# Patient Record
Sex: Male | Born: 1945 | Race: White | Hispanic: No | Marital: Married | Smoking: Former smoker
Health system: Southern US, Community
[De-identification: ages and names within clinical notes are randomized; demographics above are authoritative.]

## PROBLEM LIST (undated history)

## (undated) DIAGNOSIS — I5022 Chronic systolic (congestive) heart failure: Secondary | ICD-10-CM

## (undated) DIAGNOSIS — N400 Enlarged prostate without lower urinary tract symptoms: Secondary | ICD-10-CM

## (undated) DIAGNOSIS — E039 Hypothyroidism, unspecified: Secondary | ICD-10-CM

## (undated) DIAGNOSIS — E079 Disorder of thyroid, unspecified: Secondary | ICD-10-CM

## (undated) HISTORY — DX: Chronic systolic (congestive) heart failure: I50.22

---

## 2015-04-01 ENCOUNTER — Other Ambulatory Visit (HOSPITAL_COMMUNITY): Payer: Self-pay | Admitting: Urology

## 2015-04-01 DIAGNOSIS — R972 Elevated prostate specific antigen [PSA]: Secondary | ICD-10-CM

## 2015-04-20 ENCOUNTER — Ambulatory Visit (HOSPITAL_COMMUNITY)
Admission: RE | Admit: 2015-04-20 | Discharge: 2015-04-20 | Disposition: A | Discharge status: Home / Self Care | Payer: Medicare Other | Source: Ambulatory Visit | Attending: Urology | Admitting: Urology

## 2015-04-20 DIAGNOSIS — N400 Enlarged prostate without lower urinary tract symptoms: Secondary | ICD-10-CM | POA: Diagnosis not present

## 2015-04-20 DIAGNOSIS — R972 Elevated prostate specific antigen [PSA]: Secondary | ICD-10-CM | POA: Insufficient documentation

## 2015-04-20 MED ORDER — GADOBENATE DIMEGLUMINE 529 MG/ML IV SOLN
20.0000 mL | Freq: Once | INTRAVENOUS | Status: AC | PRN
  Administered 2015-04-20: 20 mL via INTRAVENOUS

## 2015-06-05 DIAGNOSIS — E038 Other specified hypothyroidism: Secondary | ICD-10-CM | POA: Diagnosis not present

## 2015-07-06 DIAGNOSIS — R351 Nocturia: Secondary | ICD-10-CM | POA: Diagnosis not present

## 2015-07-06 DIAGNOSIS — R972 Elevated prostate specific antigen [PSA]: Secondary | ICD-10-CM | POA: Diagnosis not present

## 2016-01-11 DIAGNOSIS — R351 Nocturia: Secondary | ICD-10-CM | POA: Diagnosis not present

## 2016-01-11 DIAGNOSIS — R972 Elevated prostate specific antigen [PSA]: Secondary | ICD-10-CM | POA: Diagnosis not present

## 2016-01-11 DIAGNOSIS — N401 Enlarged prostate with lower urinary tract symptoms: Secondary | ICD-10-CM | POA: Diagnosis not present

## 2016-01-11 DIAGNOSIS — R3914 Feeling of incomplete bladder emptying: Secondary | ICD-10-CM | POA: Diagnosis not present

## 2016-03-28 DIAGNOSIS — Z125 Encounter for screening for malignant neoplasm of prostate: Secondary | ICD-10-CM | POA: Diagnosis not present

## 2016-03-28 DIAGNOSIS — E038 Other specified hypothyroidism: Secondary | ICD-10-CM | POA: Diagnosis not present

## 2016-03-28 DIAGNOSIS — E781 Pure hyperglyceridemia: Secondary | ICD-10-CM | POA: Diagnosis not present

## 2016-04-04 DIAGNOSIS — E781 Pure hyperglyceridemia: Secondary | ICD-10-CM | POA: Diagnosis not present

## 2016-04-04 DIAGNOSIS — Z6832 Body mass index (BMI) 32.0-32.9, adult: Secondary | ICD-10-CM | POA: Diagnosis not present

## 2016-04-04 DIAGNOSIS — E038 Other specified hypothyroidism: Secondary | ICD-10-CM | POA: Diagnosis not present

## 2016-04-04 DIAGNOSIS — Z1389 Encounter for screening for other disorder: Secondary | ICD-10-CM | POA: Diagnosis not present

## 2016-04-04 DIAGNOSIS — N400 Enlarged prostate without lower urinary tract symptoms: Secondary | ICD-10-CM | POA: Diagnosis not present

## 2016-04-04 DIAGNOSIS — Z0000 Encounter for general adult medical examination without abnormal findings: Secondary | ICD-10-CM | POA: Diagnosis not present

## 2016-04-04 DIAGNOSIS — E668 Other obesity: Secondary | ICD-10-CM | POA: Diagnosis not present

## 2016-04-04 DIAGNOSIS — D698 Other specified hemorrhagic conditions: Secondary | ICD-10-CM | POA: Diagnosis not present

## 2016-06-16 DIAGNOSIS — L820 Inflamed seborrheic keratosis: Secondary | ICD-10-CM | POA: Diagnosis not present

## 2017-01-25 DIAGNOSIS — N401 Enlarged prostate with lower urinary tract symptoms: Secondary | ICD-10-CM | POA: Diagnosis not present

## 2017-01-25 DIAGNOSIS — R972 Elevated prostate specific antigen [PSA]: Secondary | ICD-10-CM | POA: Diagnosis not present

## 2017-01-25 DIAGNOSIS — R351 Nocturia: Secondary | ICD-10-CM | POA: Diagnosis not present

## 2017-04-03 DIAGNOSIS — Z125 Encounter for screening for malignant neoplasm of prostate: Secondary | ICD-10-CM | POA: Diagnosis not present

## 2017-04-03 DIAGNOSIS — E781 Pure hyperglyceridemia: Secondary | ICD-10-CM | POA: Diagnosis not present

## 2017-04-03 DIAGNOSIS — E038 Other specified hypothyroidism: Secondary | ICD-10-CM | POA: Diagnosis not present

## 2017-04-12 DIAGNOSIS — E781 Pure hyperglyceridemia: Secondary | ICD-10-CM | POA: Diagnosis not present

## 2017-04-12 DIAGNOSIS — E038 Other specified hypothyroidism: Secondary | ICD-10-CM | POA: Diagnosis not present

## 2017-04-12 DIAGNOSIS — E668 Other obesity: Secondary | ICD-10-CM | POA: Diagnosis not present

## 2017-04-12 DIAGNOSIS — Z0000 Encounter for general adult medical examination without abnormal findings: Secondary | ICD-10-CM | POA: Diagnosis not present

## 2017-05-16 DIAGNOSIS — L739 Follicular disorder, unspecified: Secondary | ICD-10-CM | POA: Diagnosis not present

## 2017-05-16 DIAGNOSIS — D0439 Carcinoma in situ of skin of other parts of face: Secondary | ICD-10-CM | POA: Diagnosis not present

## 2017-05-16 DIAGNOSIS — Z23 Encounter for immunization: Secondary | ICD-10-CM | POA: Diagnosis not present

## 2017-05-16 DIAGNOSIS — L820 Inflamed seborrheic keratosis: Secondary | ICD-10-CM | POA: Diagnosis not present

## 2017-05-16 DIAGNOSIS — D485 Neoplasm of uncertain behavior of skin: Secondary | ICD-10-CM | POA: Diagnosis not present

## 2017-07-21 DIAGNOSIS — H02135 Senile ectropion of left lower eyelid: Secondary | ICD-10-CM | POA: Diagnosis not present

## 2017-07-21 DIAGNOSIS — H02132 Senile ectropion of right lower eyelid: Secondary | ICD-10-CM | POA: Diagnosis not present

## 2017-08-07 DIAGNOSIS — H02132 Senile ectropion of right lower eyelid: Secondary | ICD-10-CM | POA: Diagnosis not present

## 2017-08-07 DIAGNOSIS — H02105 Unspecified ectropion of left lower eyelid: Secondary | ICD-10-CM | POA: Diagnosis not present

## 2017-08-07 DIAGNOSIS — H02102 Unspecified ectropion of right lower eyelid: Secondary | ICD-10-CM | POA: Diagnosis not present

## 2017-08-07 DIAGNOSIS — H02135 Senile ectropion of left lower eyelid: Secondary | ICD-10-CM | POA: Diagnosis not present

## 2017-11-13 DIAGNOSIS — L309 Dermatitis, unspecified: Secondary | ICD-10-CM | POA: Diagnosis not present

## 2017-11-13 DIAGNOSIS — D225 Melanocytic nevi of trunk: Secondary | ICD-10-CM | POA: Diagnosis not present

## 2017-11-13 DIAGNOSIS — Z85828 Personal history of other malignant neoplasm of skin: Secondary | ICD-10-CM | POA: Diagnosis not present

## 2017-11-13 DIAGNOSIS — L821 Other seborrheic keratosis: Secondary | ICD-10-CM | POA: Diagnosis not present

## 2017-11-28 DIAGNOSIS — R030 Elevated blood-pressure reading, without diagnosis of hypertension: Secondary | ICD-10-CM | POA: Diagnosis not present

## 2018-02-23 DIAGNOSIS — Z23 Encounter for immunization: Secondary | ICD-10-CM | POA: Diagnosis not present

## 2018-04-11 DIAGNOSIS — Z125 Encounter for screening for malignant neoplasm of prostate: Secondary | ICD-10-CM | POA: Diagnosis not present

## 2018-04-11 DIAGNOSIS — R82998 Other abnormal findings in urine: Secondary | ICD-10-CM | POA: Diagnosis not present

## 2018-04-11 DIAGNOSIS — E038 Other specified hypothyroidism: Secondary | ICD-10-CM | POA: Diagnosis not present

## 2018-04-11 DIAGNOSIS — E781 Pure hyperglyceridemia: Secondary | ICD-10-CM | POA: Diagnosis not present

## 2018-04-12 DIAGNOSIS — E038 Other specified hypothyroidism: Secondary | ICD-10-CM | POA: Diagnosis not present

## 2018-04-16 DIAGNOSIS — Z0000 Encounter for general adult medical examination without abnormal findings: Secondary | ICD-10-CM | POA: Diagnosis not present

## 2018-04-16 DIAGNOSIS — E038 Other specified hypothyroidism: Secondary | ICD-10-CM | POA: Diagnosis not present

## 2018-04-16 DIAGNOSIS — N400 Enlarged prostate without lower urinary tract symptoms: Secondary | ICD-10-CM | POA: Diagnosis not present

## 2018-04-16 DIAGNOSIS — E781 Pure hyperglyceridemia: Secondary | ICD-10-CM | POA: Diagnosis not present

## 2018-06-11 DIAGNOSIS — M25559 Pain in unspecified hip: Secondary | ICD-10-CM | POA: Diagnosis not present

## 2018-06-11 DIAGNOSIS — M25561 Pain in right knee: Secondary | ICD-10-CM | POA: Diagnosis not present

## 2018-06-11 DIAGNOSIS — M25552 Pain in left hip: Secondary | ICD-10-CM | POA: Diagnosis not present

## 2019-01-19 DIAGNOSIS — Z23 Encounter for immunization: Secondary | ICD-10-CM | POA: Diagnosis not present

## 2019-04-03 ENCOUNTER — Ambulatory Visit (INDEPENDENT_AMBULATORY_CARE_PROVIDER_SITE_OTHER): Payer: Medicare Other | Admitting: Podiatry

## 2019-04-03 ENCOUNTER — Encounter: Payer: Self-pay | Admitting: Podiatry

## 2019-04-03 ENCOUNTER — Other Ambulatory Visit: Payer: Self-pay

## 2019-04-03 ENCOUNTER — Ambulatory Visit (INDEPENDENT_AMBULATORY_CARE_PROVIDER_SITE_OTHER): Payer: Medicare Other

## 2019-04-03 VITALS — BP 145/81 | HR 85

## 2019-04-03 DIAGNOSIS — D3610 Benign neoplasm of peripheral nerves and autonomic nervous system, unspecified: Secondary | ICD-10-CM

## 2019-04-03 DIAGNOSIS — M79671 Pain in right foot: Secondary | ICD-10-CM

## 2019-04-03 DIAGNOSIS — L600 Ingrowing nail: Secondary | ICD-10-CM

## 2019-04-03 MED ORDER — GENTAMICIN SULFATE 0.1 % EX CREA: 1.0000 "application " | TOPICAL_CREAM | Freq: Two times a day (BID) | CUTANEOUS | 1 refills | Status: DC

## 2019-04-03 NOTE — Patient Instructions (Signed)

## 2019-04-07 NOTE — Progress Notes (Signed)
   Subjective: Patient presents today for evaluation of pain to the lateral border of the right hallux that began 1-2 weeks ago. He reports associated bleeding from the area. Patient is concerned for possible ingrown nail. He has been applying Neosporin to the toe for treatment. Wearing shoes and touching the area increases the pain. Patient presents today for further treatment and evaluation.  History reviewed. No pertinent past medical history.  Objective:  General: Well developed, nourished, in no acute distress, alert and oriented x3   Dermatology: Skin is warm, dry and supple bilateral. Lateral border right hallux appears to be erythematous with evidence of an ingrowing nail. Pain on palpation noted to the border of the nail fold. The remaining nails appear unremarkable at this time. There are no open sores, lesions.  Vascular: Dorsalis Pedis artery and Posterior Tibial artery pedal pulses palpable. No lower extremity edema noted.   Neruologic: Grossly intact via light touch bilateral.  Musculoskeletal: Muscular strength within normal limits in all groups bilateral. Normal range of motion noted to all pedal and ankle joints.   Assesement: #1 Paronychia with ingrowing nail lateral border right hallux  #2 Pain in toe #3 Incurvated nail  Plan of Care:  1. Patient evaluated.  2. Discussed treatment alternatives and plan of care. Explained nail avulsion procedure and post procedure course to patient. 3. Patient opted for permanent partial nail avulsion of the lateral border right hallux.  4. Prior to procedure, local anesthesia infiltration utilized using 3 ml of a 50:50 mixture of 2% plain lidocaine and 0.5% plain marcaine in a normal hallux block fashion and a betadine prep performed.  5. Partial permanent nail avulsion with chemical matrixectomy performed using [id number] applications of phenol followed by alcohol flush.  6. Light dressing applied. 7. Prescription for Gentamicin cream  provided to patient to use daily with a bandage.  8. Return to clinic in 3 weeks.  Retired. Used to do Location manager.    Edrick Kins, DPM [facility]  Dr. Edrick Kins, [address]                                        [place], [place] [address]                Office [phone]  Fax [phone]

## 2019-04-12 DIAGNOSIS — E038 Other specified hypothyroidism: Secondary | ICD-10-CM | POA: Diagnosis not present

## 2019-04-12 DIAGNOSIS — R82998 Other abnormal findings in urine: Secondary | ICD-10-CM | POA: Diagnosis not present

## 2019-04-12 DIAGNOSIS — E781 Pure hyperglyceridemia: Secondary | ICD-10-CM | POA: Diagnosis not present

## 2019-04-12 DIAGNOSIS — E559 Vitamin D deficiency, unspecified: Secondary | ICD-10-CM | POA: Diagnosis not present

## 2019-04-12 DIAGNOSIS — Z125 Encounter for screening for malignant neoplasm of prostate: Secondary | ICD-10-CM | POA: Diagnosis not present

## 2019-04-19 DIAGNOSIS — Z0000 Encounter for general adult medical examination without abnormal findings: Secondary | ICD-10-CM | POA: Diagnosis not present

## 2019-04-19 DIAGNOSIS — E559 Vitamin D deficiency, unspecified: Secondary | ICD-10-CM | POA: Diagnosis not present

## 2019-04-19 DIAGNOSIS — R030 Elevated blood-pressure reading, without diagnosis of hypertension: Secondary | ICD-10-CM | POA: Diagnosis not present

## 2019-04-19 DIAGNOSIS — E039 Hypothyroidism, unspecified: Secondary | ICD-10-CM | POA: Diagnosis not present

## 2019-04-24 ENCOUNTER — Ambulatory Visit (INDEPENDENT_AMBULATORY_CARE_PROVIDER_SITE_OTHER): Payer: Medicare Other | Admitting: Podiatry

## 2019-04-24 ENCOUNTER — Encounter: Payer: Self-pay | Admitting: Podiatry

## 2019-04-24 ENCOUNTER — Other Ambulatory Visit: Payer: Self-pay

## 2019-04-24 DIAGNOSIS — L600 Ingrowing nail: Secondary | ICD-10-CM

## 2019-05-01 NOTE — Progress Notes (Signed)
   Subjective: 73 y.o. male presents today status post permanent nail avulsion procedure of the lateral border of the right hallux that was performed on 04/03/2019. He states he is doing well. He denies any pain or drainage at this time. He has been using the Gentamicin cream as directed. Patient is here for further evaluation and treatment.    History reviewed. No pertinent past medical history.  Objective: Skin is warm, dry and supple. Nail and respective nail fold appears to be healing appropriately. Open wound to the associated nail fold with a granular wound base and moderate amount of fibrotic tissue. Minimal drainage noted. Mild erythema around the periungual region likely due to phenol chemical matricectomy.  Assessment: #1 postop permanent partial nail avulsion lateral border right great toenail  #2 open wound periungual nail fold of respective digit.   Plan of care: #1 patient was evaluated  #2 debridement of open wound was performed to the periungual border of the respective toe using a currette. Antibiotic ointment and Band-Aid was applied. #3 patient is to return to clinic on a PRN basis.   Edrick Kins, DPM [facility]  Dr. Edrick Kins, [address]                                        [place], [place] [address]                Office [phone]  Fax [phone]

## 2019-10-26 DIAGNOSIS — L259 Unspecified contact dermatitis, unspecified cause: Secondary | ICD-10-CM | POA: Diagnosis not present

## 2020-02-12 DIAGNOSIS — Z23 Encounter for immunization: Secondary | ICD-10-CM | POA: Diagnosis not present

## 2020-04-13 DIAGNOSIS — H9211 Otorrhea, right ear: Secondary | ICD-10-CM | POA: Diagnosis not present

## 2020-04-13 DIAGNOSIS — H9191 Unspecified hearing loss, right ear: Secondary | ICD-10-CM | POA: Diagnosis not present

## 2020-04-13 DIAGNOSIS — H6691 Otitis media, unspecified, right ear: Secondary | ICD-10-CM | POA: Diagnosis not present

## 2020-05-05 DIAGNOSIS — Z125 Encounter for screening for malignant neoplasm of prostate: Secondary | ICD-10-CM | POA: Diagnosis not present

## 2020-05-05 DIAGNOSIS — E781 Pure hyperglyceridemia: Secondary | ICD-10-CM | POA: Diagnosis not present

## 2020-05-05 DIAGNOSIS — E559 Vitamin D deficiency, unspecified: Secondary | ICD-10-CM | POA: Diagnosis not present

## 2020-05-05 DIAGNOSIS — E039 Hypothyroidism, unspecified: Secondary | ICD-10-CM | POA: Diagnosis not present

## 2020-05-08 DIAGNOSIS — M4727 Other spondylosis with radiculopathy, lumbosacral region: Secondary | ICD-10-CM | POA: Diagnosis not present

## 2020-05-08 DIAGNOSIS — M5116 Intervertebral disc disorders with radiculopathy, lumbar region: Secondary | ICD-10-CM | POA: Diagnosis not present

## 2020-05-08 DIAGNOSIS — M48061 Spinal stenosis, lumbar region without neurogenic claudication: Secondary | ICD-10-CM | POA: Diagnosis not present

## 2020-05-08 DIAGNOSIS — M4726 Other spondylosis with radiculopathy, lumbar region: Secondary | ICD-10-CM | POA: Diagnosis not present

## 2020-05-12 DIAGNOSIS — R82998 Other abnormal findings in urine: Secondary | ICD-10-CM | POA: Diagnosis not present

## 2020-06-15 DIAGNOSIS — R6889 Other general symptoms and signs: Secondary | ICD-10-CM | POA: Diagnosis not present

## 2020-06-15 DIAGNOSIS — M5136 Other intervertebral disc degeneration, lumbar region: Secondary | ICD-10-CM | POA: Diagnosis not present

## 2020-06-15 DIAGNOSIS — M5416 Radiculopathy, lumbar region: Secondary | ICD-10-CM | POA: Diagnosis not present

## 2020-06-15 DIAGNOSIS — M4727 Other spondylosis with radiculopathy, lumbosacral region: Secondary | ICD-10-CM | POA: Diagnosis not present

## 2020-06-24 DIAGNOSIS — M5416 Radiculopathy, lumbar region: Secondary | ICD-10-CM | POA: Diagnosis not present

## 2020-06-24 DIAGNOSIS — M48061 Spinal stenosis, lumbar region without neurogenic claudication: Secondary | ICD-10-CM | POA: Diagnosis not present

## 2020-06-24 DIAGNOSIS — M5136 Other intervertebral disc degeneration, lumbar region: Secondary | ICD-10-CM | POA: Diagnosis not present

## 2020-06-24 DIAGNOSIS — M4727 Other spondylosis with radiculopathy, lumbosacral region: Secondary | ICD-10-CM | POA: Diagnosis not present

## 2020-07-02 DIAGNOSIS — R6889 Other general symptoms and signs: Secondary | ICD-10-CM | POA: Diagnosis not present

## 2020-07-02 DIAGNOSIS — M5416 Radiculopathy, lumbar region: Secondary | ICD-10-CM | POA: Diagnosis not present

## 2020-07-23 DIAGNOSIS — R6889 Other general symptoms and signs: Secondary | ICD-10-CM | POA: Diagnosis not present

## 2020-07-23 DIAGNOSIS — M5416 Radiculopathy, lumbar region: Secondary | ICD-10-CM | POA: Diagnosis not present

## 2021-03-01 DIAGNOSIS — H9191 Unspecified hearing loss, right ear: Secondary | ICD-10-CM | POA: Diagnosis not present

## 2021-03-01 DIAGNOSIS — R030 Elevated blood-pressure reading, without diagnosis of hypertension: Secondary | ICD-10-CM | POA: Diagnosis not present

## 2021-03-01 DIAGNOSIS — E039 Hypothyroidism, unspecified: Secondary | ICD-10-CM | POA: Diagnosis not present

## 2021-03-01 DIAGNOSIS — R519 Headache, unspecified: Secondary | ICD-10-CM | POA: Diagnosis not present

## 2021-03-01 DIAGNOSIS — R61 Generalized hyperhidrosis: Secondary | ICD-10-CM | POA: Diagnosis not present

## 2021-03-02 ENCOUNTER — Other Ambulatory Visit: Payer: Self-pay | Admitting: Internal Medicine

## 2021-03-02 ENCOUNTER — Other Ambulatory Visit (HOSPITAL_COMMUNITY): Payer: Self-pay | Admitting: Internal Medicine

## 2021-03-02 DIAGNOSIS — R519 Headache, unspecified: Secondary | ICD-10-CM

## 2021-03-04 ENCOUNTER — Ambulatory Visit (HOSPITAL_COMMUNITY)
Admission: RE | Admit: 2021-03-04 | Discharge: 2021-03-04 | Disposition: A | Discharge status: Home / Self Care | Payer: Medicare Other | Source: Ambulatory Visit | Attending: Internal Medicine | Admitting: Internal Medicine

## 2021-03-04 DIAGNOSIS — R519 Headache, unspecified: Secondary | ICD-10-CM | POA: Insufficient documentation

## 2021-03-04 DIAGNOSIS — R413 Other amnesia: Secondary | ICD-10-CM | POA: Diagnosis not present

## 2021-03-04 DIAGNOSIS — G319 Degenerative disease of nervous system, unspecified: Secondary | ICD-10-CM | POA: Diagnosis not present

## 2021-03-04 MED ORDER — GADOBUTROL 1 MMOL/ML IV SOLN
10.0000 mL | Freq: Once | INTRAVENOUS | Status: AC | PRN
  Administered 2021-03-04: 10 mL via INTRAVENOUS

## 2021-05-24 DIAGNOSIS — R61 Generalized hyperhidrosis: Secondary | ICD-10-CM | POA: Diagnosis not present

## 2021-05-24 DIAGNOSIS — E039 Hypothyroidism, unspecified: Secondary | ICD-10-CM | POA: Diagnosis not present

## 2021-05-24 DIAGNOSIS — E781 Pure hyperglyceridemia: Secondary | ICD-10-CM | POA: Diagnosis not present

## 2021-05-24 DIAGNOSIS — E559 Vitamin D deficiency, unspecified: Secondary | ICD-10-CM | POA: Diagnosis not present

## 2021-05-24 DIAGNOSIS — Z125 Encounter for screening for malignant neoplasm of prostate: Secondary | ICD-10-CM | POA: Diagnosis not present

## 2021-05-24 DIAGNOSIS — R972 Elevated prostate specific antigen [PSA]: Secondary | ICD-10-CM | POA: Diagnosis not present

## 2021-05-31 DIAGNOSIS — R82998 Other abnormal findings in urine: Secondary | ICD-10-CM | POA: Diagnosis not present

## 2021-07-19 DIAGNOSIS — E039 Hypothyroidism, unspecified: Secondary | ICD-10-CM | POA: Diagnosis not present

## 2021-12-27 DIAGNOSIS — E039 Hypothyroidism, unspecified: Secondary | ICD-10-CM | POA: Diagnosis not present

## 2021-12-27 DIAGNOSIS — R634 Abnormal weight loss: Secondary | ICD-10-CM | POA: Diagnosis not present

## 2021-12-27 DIAGNOSIS — E162 Hypoglycemia, unspecified: Secondary | ICD-10-CM | POA: Diagnosis not present

## 2022-01-05 DIAGNOSIS — E039 Hypothyroidism, unspecified: Secondary | ICD-10-CM | POA: Diagnosis not present

## 2022-01-07 DIAGNOSIS — L570 Actinic keratosis: Secondary | ICD-10-CM | POA: Diagnosis not present

## 2022-02-05 DIAGNOSIS — Z23 Encounter for immunization: Secondary | ICD-10-CM | POA: Diagnosis not present

## 2022-02-11 DIAGNOSIS — H2512 Age-related nuclear cataract, left eye: Secondary | ICD-10-CM | POA: Diagnosis not present

## 2022-02-11 DIAGNOSIS — H35363 Drusen (degenerative) of macula, bilateral: Secondary | ICD-10-CM | POA: Diagnosis not present

## 2022-02-11 DIAGNOSIS — H2511 Age-related nuclear cataract, right eye: Secondary | ICD-10-CM | POA: Diagnosis not present

## 2022-02-14 DIAGNOSIS — E039 Hypothyroidism, unspecified: Secondary | ICD-10-CM | POA: Diagnosis not present

## 2022-03-11 DIAGNOSIS — L570 Actinic keratosis: Secondary | ICD-10-CM | POA: Diagnosis not present

## 2022-04-18 DIAGNOSIS — R972 Elevated prostate specific antigen [PSA]: Secondary | ICD-10-CM | POA: Diagnosis not present

## 2022-05-12 DIAGNOSIS — E039 Hypothyroidism, unspecified: Secondary | ICD-10-CM | POA: Diagnosis not present

## 2022-05-24 ENCOUNTER — Other Ambulatory Visit: Payer: Self-pay | Admitting: Urology

## 2022-05-24 DIAGNOSIS — R972 Elevated prostate specific antigen [PSA]: Secondary | ICD-10-CM

## 2022-06-13 DIAGNOSIS — R3589 Other polyuria: Secondary | ICD-10-CM | POA: Diagnosis not present

## 2022-06-15 DIAGNOSIS — E781 Pure hyperglyceridemia: Secondary | ICD-10-CM | POA: Diagnosis not present

## 2022-06-15 DIAGNOSIS — E559 Vitamin D deficiency, unspecified: Secondary | ICD-10-CM | POA: Diagnosis not present

## 2022-06-15 DIAGNOSIS — E039 Hypothyroidism, unspecified: Secondary | ICD-10-CM | POA: Diagnosis not present

## 2022-06-15 DIAGNOSIS — R030 Elevated blood-pressure reading, without diagnosis of hypertension: Secondary | ICD-10-CM | POA: Diagnosis not present

## 2022-06-15 DIAGNOSIS — E162 Hypoglycemia, unspecified: Secondary | ICD-10-CM | POA: Diagnosis not present

## 2022-06-15 DIAGNOSIS — Z125 Encounter for screening for malignant neoplasm of prostate: Secondary | ICD-10-CM | POA: Diagnosis not present

## 2022-06-20 DIAGNOSIS — Z1339 Encounter for screening examination for other mental health and behavioral disorders: Secondary | ICD-10-CM | POA: Diagnosis not present

## 2022-06-20 DIAGNOSIS — Z23 Encounter for immunization: Secondary | ICD-10-CM | POA: Diagnosis not present

## 2022-06-20 DIAGNOSIS — Z0000 Encounter for general adult medical examination without abnormal findings: Secondary | ICD-10-CM | POA: Diagnosis not present

## 2022-06-20 DIAGNOSIS — N400 Enlarged prostate without lower urinary tract symptoms: Secondary | ICD-10-CM | POA: Diagnosis not present

## 2022-06-20 DIAGNOSIS — Z1331 Encounter for screening for depression: Secondary | ICD-10-CM | POA: Diagnosis not present

## 2022-06-20 DIAGNOSIS — E039 Hypothyroidism, unspecified: Secondary | ICD-10-CM | POA: Diagnosis not present

## 2022-06-20 DIAGNOSIS — R972 Elevated prostate specific antigen [PSA]: Secondary | ICD-10-CM | POA: Diagnosis not present

## 2022-06-21 DIAGNOSIS — R338 Other retention of urine: Secondary | ICD-10-CM | POA: Diagnosis not present

## 2022-06-24 ENCOUNTER — Ambulatory Visit
Admission: RE | Admit: 2022-06-24 | Discharge: 2022-06-24 | Disposition: A | Discharge status: Home / Self Care | Payer: Medicare Other | Source: Ambulatory Visit | Attending: Urology | Admitting: Urology

## 2022-06-24 DIAGNOSIS — R972 Elevated prostate specific antigen [PSA]: Secondary | ICD-10-CM | POA: Diagnosis not present

## 2022-06-24 MED ORDER — GADOPICLENOL 0.5 MMOL/ML IV SOLN
10.0000 mL | Freq: Once | INTRAVENOUS | Status: AC | PRN
  Administered 2022-06-24: 10 mL via INTRAVENOUS

## 2022-06-28 DIAGNOSIS — R338 Other retention of urine: Secondary | ICD-10-CM | POA: Diagnosis not present

## 2022-06-28 DIAGNOSIS — N401 Enlarged prostate with lower urinary tract symptoms: Secondary | ICD-10-CM | POA: Diagnosis not present

## 2022-06-28 DIAGNOSIS — R972 Elevated prostate specific antigen [PSA]: Secondary | ICD-10-CM | POA: Diagnosis not present

## 2022-06-30 DIAGNOSIS — N401 Enlarged prostate with lower urinary tract symptoms: Secondary | ICD-10-CM | POA: Diagnosis not present

## 2022-06-30 DIAGNOSIS — R338 Other retention of urine: Secondary | ICD-10-CM | POA: Diagnosis not present

## 2022-07-01 DIAGNOSIS — Z1212 Encounter for screening for malignant neoplasm of rectum: Secondary | ICD-10-CM | POA: Diagnosis not present

## 2022-07-02 ENCOUNTER — Inpatient Hospital Stay (HOSPITAL_BASED_OUTPATIENT_CLINIC_OR_DEPARTMENT_OTHER)
Admission: EM | Admit: 2022-07-02 | Discharge: 2022-07-10 | DRG: 666 | Disposition: A | Discharge status: Home Health | Payer: Medicare Other | Attending: Family Medicine | Admitting: Family Medicine

## 2022-07-02 ENCOUNTER — Other Ambulatory Visit: Payer: Self-pay

## 2022-07-02 ENCOUNTER — Encounter (HOSPITAL_BASED_OUTPATIENT_CLINIC_OR_DEPARTMENT_OTHER): Payer: Self-pay | Admitting: Emergency Medicine

## 2022-07-02 DIAGNOSIS — Z87891 Personal history of nicotine dependence: Secondary | ICD-10-CM | POA: Diagnosis not present

## 2022-07-02 DIAGNOSIS — R591 Generalized enlarged lymph nodes: Secondary | ICD-10-CM | POA: Diagnosis not present

## 2022-07-02 DIAGNOSIS — I420 Dilated cardiomyopathy: Secondary | ICD-10-CM | POA: Diagnosis not present

## 2022-07-02 DIAGNOSIS — I5022 Chronic systolic (congestive) heart failure: Secondary | ICD-10-CM | POA: Insufficient documentation

## 2022-07-02 DIAGNOSIS — I5021 Acute systolic (congestive) heart failure: Secondary | ICD-10-CM | POA: Diagnosis not present

## 2022-07-02 DIAGNOSIS — B9561 Methicillin susceptible Staphylococcus aureus infection as the cause of diseases classified elsewhere: Secondary | ICD-10-CM | POA: Diagnosis present

## 2022-07-02 DIAGNOSIS — Z792 Long term (current) use of antibiotics: Secondary | ICD-10-CM | POA: Diagnosis not present

## 2022-07-02 DIAGNOSIS — R972 Elevated prostate specific antigen [PSA]: Secondary | ICD-10-CM | POA: Diagnosis present

## 2022-07-02 DIAGNOSIS — N3001 Acute cystitis with hematuria: Secondary | ICD-10-CM | POA: Diagnosis not present

## 2022-07-02 DIAGNOSIS — N390 Urinary tract infection, site not specified: Secondary | ICD-10-CM | POA: Diagnosis not present

## 2022-07-02 DIAGNOSIS — R931 Abnormal findings on diagnostic imaging of heart and coronary circulation: Secondary | ICD-10-CM | POA: Diagnosis not present

## 2022-07-02 DIAGNOSIS — R7881 Bacteremia: Secondary | ICD-10-CM | POA: Diagnosis present

## 2022-07-02 DIAGNOSIS — R0989 Other specified symptoms and signs involving the circulatory and respiratory systems: Secondary | ICD-10-CM | POA: Diagnosis not present

## 2022-07-02 DIAGNOSIS — Y846 Urinary catheterization as the cause of abnormal reaction of the patient, or of later complication, without mention of misadventure at the time of the procedure: Secondary | ICD-10-CM | POA: Diagnosis not present

## 2022-07-02 DIAGNOSIS — N179 Acute kidney failure, unspecified: Secondary | ICD-10-CM

## 2022-07-02 DIAGNOSIS — I9581 Postprocedural hypotension: Secondary | ICD-10-CM | POA: Diagnosis not present

## 2022-07-02 DIAGNOSIS — Z452 Encounter for adjustment and management of vascular access device: Secondary | ICD-10-CM | POA: Diagnosis not present

## 2022-07-02 DIAGNOSIS — I10 Essential (primary) hypertension: Secondary | ICD-10-CM | POA: Diagnosis present

## 2022-07-02 DIAGNOSIS — N1330 Unspecified hydronephrosis: Secondary | ICD-10-CM | POA: Diagnosis not present

## 2022-07-02 DIAGNOSIS — I110 Hypertensive heart disease with heart failure: Secondary | ICD-10-CM | POA: Diagnosis not present

## 2022-07-02 DIAGNOSIS — I5023 Acute on chronic systolic (congestive) heart failure: Secondary | ICD-10-CM | POA: Diagnosis not present

## 2022-07-02 DIAGNOSIS — N401 Enlarged prostate with lower urinary tract symptoms: Secondary | ICD-10-CM | POA: Diagnosis present

## 2022-07-02 DIAGNOSIS — I083 Combined rheumatic disorders of mitral, aortic and tricuspid valves: Secondary | ICD-10-CM | POA: Diagnosis not present

## 2022-07-02 DIAGNOSIS — D62 Acute posthemorrhagic anemia: Secondary | ICD-10-CM | POA: Diagnosis not present

## 2022-07-02 DIAGNOSIS — I509 Heart failure, unspecified: Secondary | ICD-10-CM | POA: Diagnosis not present

## 2022-07-02 DIAGNOSIS — E039 Hypothyroidism, unspecified: Secondary | ICD-10-CM | POA: Diagnosis not present

## 2022-07-02 DIAGNOSIS — T83091A Other mechanical complication of indwelling urethral catheter, initial encounter: Secondary | ICD-10-CM | POA: Diagnosis present

## 2022-07-02 DIAGNOSIS — R338 Other retention of urine: Secondary | ICD-10-CM | POA: Diagnosis present

## 2022-07-02 DIAGNOSIS — Z79890 Hormone replacement therapy (postmenopausal): Secondary | ICD-10-CM | POA: Diagnosis not present

## 2022-07-02 DIAGNOSIS — K5900 Constipation, unspecified: Secondary | ICD-10-CM | POA: Diagnosis present

## 2022-07-02 DIAGNOSIS — T839XXA Unspecified complication of genitourinary prosthetic device, implant and graft, initial encounter: Secondary | ICD-10-CM

## 2022-07-02 DIAGNOSIS — R319 Hematuria, unspecified: Secondary | ICD-10-CM | POA: Diagnosis not present

## 2022-07-02 DIAGNOSIS — D638 Anemia in other chronic diseases classified elsewhere: Secondary | ICD-10-CM | POA: Diagnosis not present

## 2022-07-02 DIAGNOSIS — N3289 Other specified disorders of bladder: Secondary | ICD-10-CM | POA: Diagnosis not present

## 2022-07-02 DIAGNOSIS — R531 Weakness: Secondary | ICD-10-CM | POA: Diagnosis not present

## 2022-07-02 DIAGNOSIS — R310 Gross hematuria: Secondary | ICD-10-CM | POA: Diagnosis present

## 2022-07-02 DIAGNOSIS — Z79899 Other long term (current) drug therapy: Secondary | ICD-10-CM

## 2022-07-02 DIAGNOSIS — R000 Tachycardia, unspecified: Secondary | ICD-10-CM | POA: Diagnosis not present

## 2022-07-02 DIAGNOSIS — N400 Enlarged prostate without lower urinary tract symptoms: Secondary | ICD-10-CM | POA: Diagnosis not present

## 2022-07-02 DIAGNOSIS — Z466 Encounter for fitting and adjustment of urinary device: Secondary | ICD-10-CM | POA: Diagnosis not present

## 2022-07-02 DIAGNOSIS — N134 Hydroureter: Secondary | ICD-10-CM | POA: Diagnosis not present

## 2022-07-02 DIAGNOSIS — R6510 Systemic inflammatory response syndrome (SIRS) of non-infectious origin without acute organ dysfunction: Secondary | ICD-10-CM

## 2022-07-02 DIAGNOSIS — T83511A Infection and inflammatory reaction due to indwelling urethral catheter, initial encounter: Secondary | ICD-10-CM | POA: Diagnosis not present

## 2022-07-02 DIAGNOSIS — Z556 Problems related to health literacy: Secondary | ICD-10-CM | POA: Diagnosis not present

## 2022-07-02 DIAGNOSIS — R935 Abnormal findings on diagnostic imaging of other abdominal regions, including retroperitoneum: Secondary | ICD-10-CM | POA: Diagnosis not present

## 2022-07-02 DIAGNOSIS — I255 Ischemic cardiomyopathy: Secondary | ICD-10-CM | POA: Diagnosis not present

## 2022-07-02 DIAGNOSIS — B958 Unspecified staphylococcus as the cause of diseases classified elsewhere: Secondary | ICD-10-CM | POA: Diagnosis not present

## 2022-07-02 DIAGNOSIS — I119 Hypertensive heart disease without heart failure: Secondary | ICD-10-CM | POA: Diagnosis not present

## 2022-07-02 DIAGNOSIS — I351 Nonrheumatic aortic (valve) insufficiency: Secondary | ICD-10-CM | POA: Diagnosis not present

## 2022-07-02 DIAGNOSIS — R590 Localized enlarged lymph nodes: Secondary | ICD-10-CM | POA: Insufficient documentation

## 2022-07-02 DIAGNOSIS — D500 Iron deficiency anemia secondary to blood loss (chronic): Secondary | ICD-10-CM

## 2022-07-02 HISTORY — DX: Benign prostatic hyperplasia without lower urinary tract symptoms: N40.0

## 2022-07-02 HISTORY — DX: Hypothyroidism, unspecified: E03.9

## 2022-07-02 HISTORY — DX: Disorder of thyroid, unspecified: E07.9

## 2022-07-02 LAB — CBC WITH DIFFERENTIAL/PLATELET
Abs Immature Granulocytes: 0.29 10*3/uL — ABNORMAL HIGH (ref 0.00–0.07)
Basophils Absolute: 0 10*3/uL (ref 0.0–0.1)
Basophils Relative: 0 %
Eosinophils Absolute: 0.1 10*3/uL (ref 0.0–0.5)
Eosinophils Relative: 1 %
HCT: 34.9 % — ABNORMAL LOW (ref 39.0–52.0)
Hemoglobin: 11.9 g/dL — ABNORMAL LOW (ref 13.0–17.0)
Immature Granulocytes: 2 %
Lymphocytes Relative: 5 %
Lymphs Abs: 0.8 10*3/uL (ref 0.7–4.0)
MCH: 30.9 pg (ref 26.0–34.0)
MCHC: 34.1 g/dL (ref 30.0–36.0)
MCV: 90.6 fL (ref 80.0–100.0)
Monocytes Absolute: 2 10*3/uL — ABNORMAL HIGH (ref 0.1–1.0)
Monocytes Relative: 13 %
Neutro Abs: 11.9 10*3/uL — ABNORMAL HIGH (ref 1.7–7.7)
Neutrophils Relative %: 79 %
Platelets: 147 10*3/uL — ABNORMAL LOW (ref 150–400)
RBC: 3.85 MIL/uL — ABNORMAL LOW (ref 4.22–5.81)
RDW: 14.1 % (ref 11.5–15.5)
WBC: 15 10*3/uL — ABNORMAL HIGH (ref 4.0–10.5)
nRBC: 0 % (ref 0.0–0.2)

## 2022-07-02 LAB — PROTIME-INR
INR: 1.3 — ABNORMAL HIGH (ref 0.8–1.2)
Prothrombin Time: 16.2 seconds — ABNORMAL HIGH (ref 11.4–15.2)

## 2022-07-02 LAB — URINALYSIS, W/ REFLEX TO CULTURE (INFECTION SUSPECTED)
Bilirubin Urine: NEGATIVE
Glucose, UA: NEGATIVE mg/dL
Ketones, ur: 15 mg/dL — AB
Nitrite: NEGATIVE
Protein, ur: 100 mg/dL — AB
RBC / HPF: >50 RBC/hpf (ref 0–5)
Specific Gravity, Urine: 1.007 (ref 1.005–1.030)
WBC, UA: >50 WBC/hpf (ref 0–5)
pH: 6.5 (ref 5.0–8.0)

## 2022-07-02 LAB — BASIC METABOLIC PANEL
Anion gap: 14 (ref 5–15)
BUN: 21 mg/dL (ref 8–23)
CO2: 20 mmol/L — ABNORMAL LOW (ref 22–32)
Calcium: 9.2 mg/dL (ref 8.9–10.3)
Chloride: 100 mmol/L (ref 98–111)
Creatinine, Ser: 1.18 mg/dL (ref 0.61–1.24)
GFR, Estimated: >60 mL/min (ref >60)
Glucose, Bld: 142 mg/dL — ABNORMAL HIGH (ref 70–99)
Potassium: 3.8 mmol/L (ref 3.5–5.1)
Sodium: 134 mmol/L — ABNORMAL LOW (ref 135–145)

## 2022-07-02 MED ORDER — HYDRALAZINE HCL 20 MG/ML IJ SOLN: 5.0000 mg | Freq: Four times a day (QID) | INTRAMUSCULAR | Status: DC | PRN

## 2022-07-02 MED ORDER — SODIUM CHLORIDE 0.9 % IV SOLN: 1.0000 g | Freq: Once | INTRAVENOUS | Status: DC

## 2022-07-02 MED ORDER — TAMSULOSIN HCL 0.4 MG PO CAPS
0.4000 mg | ORAL_CAPSULE | Freq: Every day | ORAL | Status: DC
  Administered 2022-07-03: 0.4 mg via ORAL
  Filled 2022-07-02: qty 1

## 2022-07-02 MED ORDER — ACETAMINOPHEN 325 MG PO TABS
650.0000 mg | ORAL_TABLET | Freq: Four times a day (QID) | ORAL | Status: DC | PRN
  Administered 2022-07-03 – 2022-07-10 (×7): 650 mg via ORAL
  Filled 2022-07-02 (×7): qty 2

## 2022-07-02 MED ORDER — LACTATED RINGERS IV BOLUS
1000.0000 mL | Freq: Once | INTRAVENOUS | Status: AC
  Administered 2022-07-02: 1000 mL via INTRAVENOUS

## 2022-07-02 MED ORDER — LACTATED RINGERS IV BOLUS: 1000.0000 mL | Freq: Once | INTRAVENOUS | Status: DC

## 2022-07-02 MED ORDER — SODIUM CHLORIDE 0.9 % IV SOLN
1.0000 g | INTRAVENOUS | Status: DC
  Filled 2022-07-02: qty 10

## 2022-07-02 MED ORDER — SODIUM CHLORIDE 0.9 % IV SOLN
1.0000 g | Freq: Once | INTRAVENOUS | Status: AC
  Administered 2022-07-02: 1 g via INTRAVENOUS
  Filled 2022-07-02: qty 10

## 2022-07-02 MED ORDER — FINASTERIDE 5 MG PO TABS
5.0000 mg | ORAL_TABLET | Freq: Every day | ORAL | Status: DC
  Administered 2022-07-03: 5 mg via ORAL
  Filled 2022-07-02: qty 1

## 2022-07-02 MED ORDER — POLYETHYLENE GLYCOL 3350 17 G PO PACK
17.0000 g | PACK | Freq: Every day | ORAL | Status: DC | PRN
  Administered 2022-07-04 – 2022-07-06 (×2): 17 g via ORAL
  Filled 2022-07-02 (×2): qty 1

## 2022-07-02 MED ORDER — POTASSIUM CHLORIDE IN NACL 20-0.9 MEQ/L-% IV SOLN
INTRAVENOUS | Status: DC
  Filled 2022-07-02 (×7): qty 1000

## 2022-07-02 MED ORDER — LEVOTHYROXINE SODIUM 112 MCG PO TABS
112.0000 ug | ORAL_TABLET | Freq: Every day | ORAL | Status: DC
  Administered 2022-07-03 – 2022-07-10 (×8): 112 ug via ORAL
  Filled 2022-07-02 (×8): qty 1

## 2022-07-02 MED ORDER — ONDANSETRON HCL 4 MG/2ML IJ SOLN: 4.0000 mg | Freq: Four times a day (QID) | INTRAMUSCULAR | Status: DC | PRN

## 2022-07-02 MED ORDER — CEPHALEXIN 500 MG PO CAPS: 500.0000 mg | ORAL_CAPSULE | Freq: Four times a day (QID) | ORAL | 0 refills | Status: DC

## 2022-07-02 MED ORDER — DOCUSATE SODIUM 100 MG PO CAPS
100.0000 mg | ORAL_CAPSULE | Freq: Two times a day (BID) | ORAL | Status: DC
  Administered 2022-07-02 – 2022-07-10 (×16): 100 mg via ORAL
  Filled 2022-07-02 (×16): qty 1

## 2022-07-02 MED ORDER — ACETAMINOPHEN 650 MG RE SUPP: 650.0000 mg | Freq: Four times a day (QID) | RECTAL | Status: DC | PRN

## 2022-07-02 MED ORDER — ONDANSETRON HCL 4 MG PO TABS: 4.0000 mg | ORAL_TABLET | Freq: Four times a day (QID) | ORAL | Status: DC | PRN

## 2022-07-02 NOTE — Discharge Instructions (Addendum)
Call on Monday to schedule follow-up with urology. We will start you on Keflex due to concern for developing UTI.  Return to the emergency department in the event of any recurrence of symptoms to include recurrent hematuria/blood clots in your Foley catheter, recurrent abdominal pain in the setting of hematuria.

## 2022-07-02 NOTE — ED Triage Notes (Signed)
Pt reports his foley quit draining during the night. PT reports lower abdominal pain.

## 2022-07-02 NOTE — ED Notes (Signed)
Taryn at [facility] will send transport for Bed Ready at [facility] Rm# [id number].-ABB(NS)

## 2022-07-02 NOTE — ED Notes (Signed)
PT vitals failed to crossover from triage

## 2022-07-02 NOTE — H&P (Signed)
History and Physical  Steven Carter [id number] DOB: Aug 06, 1945 DOA: 07/02/2022   PCP: [facility] [place]   Patient coming from: Home   Chief Complaint: Foley Dysfunction   HPI: Steven Carter is a 77 y.o. male with medical history significant for BPH, hypothyroid admitted with gross hematuria. He had Foley placed by urology at the end of February in preparation for PET scan and prostate bx in the coming weeks. Has been doing well but woke up this AM with clogged foley and blood in foley bag as well as lower pelvic pain. Foley was replaced at [facility] and patient is feeling much better. Due to initial tachycardia and HTN ER MD felt uncomfortable sending home so he has been admitted to [facility]. Currently very comfortable and has no complaints. Urology is aware of his admission.  Review of Systems: Please see HPI for pertinent positives and negatives. A complete 10 system review of systems are otherwise negative.  Past Medical History:  Diagnosis Date   BPH (benign prostatic hyperplasia)    Hypothyroidism    Thyroid disease    History reviewed. No pertinent surgical history.  Social History:  reports that he has never smoked. He has never used smokeless tobacco. He reports current alcohol use of about 7.0 standard drinks of alcohol per week. He reports that he does not use drugs.   No Known Allergies  History reviewed. No pertinent family history.   Prior to Admission medications   Medication Sig Start Date End Date Taking? Authorizing Provider  cephALEXin (KEFLEX) 500 MG capsule Take 1 capsule (500 mg total) by mouth 4 (four) times daily. 07/02/22  Yes Regan Lemming, MD  levofloxacin (LEVAQUIN) 750 MG tablet Take 750 mg by mouth daily. 07/01/22  Yes [provider]  oxybutynin (DITROPAN-XL) 5 MG 24 hr tablet Take 5 mg by mouth daily. 06/28/22  Yes [provider]  finasteride (PROSCAR) 5 MG tablet Take 5 mg by mouth daily. 04/19/19   [provider]   gentamicin cream (GARAMYCIN) 0.1 % Apply 1 application topically 2 (two) times daily. 04/03/19   Edrick Kins, DPM  levothyroxine (SYNTHROID) 112 MCG tablet Take 112 mcg by mouth daily. 01/17/19   [provider]  meloxicam (MOBIC) 15 MG tablet Take 15 mg by mouth daily as needed. 03/31/19   [provider]  TAMSULOSIN HCL PO Take by mouth.    [provider]    Physical Exam: BP (!) 157/86 (BP Location: Left Arm)   Pulse 97   Temp 98.3 F (36.8 C) (Oral)   Resp 18   SpO2 100%   General:  Alert, oriented, calm, in no acute distress  Eyes: EOMI, clear conjuctivae, white sclerea Neck: supple, no masses, trachea mildline  Cardiovascular: RRR, no murmurs or rubs, no peripheral edema  Respiratory: clear to auscultation bilaterally, no wheezes, no crackles  Abdomen: soft, nontender, nondistended, normal bowel tones heard  GU: light pink urine in foley, no clots Skin: dry, no rashes  Musculoskeletal: no joint effusions, normal range of motion  Psychiatric: appropriate affect, normal speech  Neurologic: extraocular muscles intact, clear speech, moving all extremities with intact sensorium          Labs on Admission:  Basic Metabolic Panel: Recent Labs  Lab 07/02/22 0838  NA 134*  K 3.8  CL 100  CO2 20*  GLUCOSE 142*  BUN 21  CREATININE 1.18  CALCIUM 9.2   Liver Function Tests: No results for input(s): "AST", "ALT", "ALKPHOS", "BILITOT", "PROT", "  ALBUMIN" in the last 168 hours. No results for input(s): "LIPASE", "AMYLASE" in the last 168 hours. No results for input(s): "AMMONIA" in the last 168 hours. CBC: Recent Labs  Lab 07/02/22 0838  WBC 15.0*  NEUTROABS 11.9*  HGB 11.9*  HCT 34.9*  MCV 90.6  PLT 147*   Cardiac Enzymes: No results for input(s): "CKTOTAL", "CKMB", "CKMBINDEX", "TROPONINI" in the last 168 hours.  BNP (last 3 results) No results for input(s): "BNP" in the last 8760 hours.  ProBNP (last 3 results) No results for  input(s): "PROBNP" in the last 8760 hours.  CBG: No results for input(s): "GLUCAP" in the last 168 hours.  Radiological Exams on Admission: No results found.  Assessment/Plan Principal Problem:   UTI (urinary tract infection) - suspected with hematuria, tachycardia and abnormal UA - obs admit - foley replaced - cont empiric Rocephin - follow urine and blood cultures - urology aware and will follow - anticipate home in AM with GU follow up if remains stable Active Problems:   Gross hematuria   Tachycardia  DVT prophylaxis: SCDs  Code Status: Full  Disposition Plan: Home at [place] called: Urology  Admission status: Obs  Time spent: 32 minutes   Neva Seat MD [facility] Pager [phone]  If 7PM-7AM, please contact night-coverage [email or web address] Password [facility]  07/02/2022, 1:43 PM

## 2022-07-02 NOTE — ED Notes (Signed)
Irrigated foley over period of 45 minutes , with 750 cc sterile water. Pink tinged urine draining, will monitor. Pt reports no pain

## 2022-07-02 NOTE — ED Provider Notes (Addendum)
[facility] Provider Note   CSN: [id number] Arrival date & time: 07/02/22  [id number]     History  Chief Complaint  Patient presents with   Foley Catheter Problem    Steven Carter is a 77 y.o. male.  HPI   77 year old male with medical history significant for BPH, recent episode of urinary retention requiring Foley catheter placement, concern for possible rectal neoplasm on recent MRI imaging vs locally invasive prostate neoplasm with a mass noted in the prostate and enlarged lymph nodes noted around the rectum who presents to the emergency department with concern for a clogged Foley catheter.  The patient noted that his Foley catheter was draining as of last night.  He woke up this morning with blood in his urine and an inability for his catheter to drain.  He endorses suprapubic abdominal pain.  He is scheduled for PET guided prostate biopsy next week.  Home Medications Prior to Admission medications   Medication Sig Start Date End Date Taking? Authorizing Provider  cephALEXin (KEFLEX) 500 MG capsule Take 1 capsule (500 mg total) by mouth 4 (four) times daily. 07/02/22  Yes Regan Lemming, MD  finasteride (PROSCAR) 5 MG tablet Take 5 mg by mouth daily. 04/19/19   [provider]  gentamicin cream (GARAMYCIN) 0.1 % Apply 1 application topically 2 (two) times daily. 04/03/19   Edrick Kins, DPM  levothyroxine (SYNTHROID) 112 MCG tablet Take 112 mcg by mouth daily. 01/17/19   [provider]  meloxicam (MOBIC) 15 MG tablet Take 15 mg by mouth daily as needed. 03/31/19   [provider]  TAMSULOSIN HCL PO Take by mouth.    [provider]      Allergies    Patient has no known allergies.    Review of Systems   Review of Systems  All other systems reviewed and are negative.   Physical Exam Updated Vital Signs BP (!) 161/81   Pulse 98   Temp 98 F (36.7 C) (Oral)   Resp 14   SpO2 100%  Physical  Exam Vitals and nursing note reviewed.  Constitutional:      General: He is in acute distress.     Appearance: He is well-developed.     Comments: Uncomfortable appearing  HENT:     Head: Normocephalic and atraumatic.  Eyes:     Conjunctiva/sclera: Conjunctivae normal.  Cardiovascular:     Rate and Rhythm: Normal rate and regular rhythm.  Pulmonary:     Effort: Pulmonary effort is normal. No respiratory distress.     Breath sounds: Normal breath sounds.  Abdominal:     Palpations: Abdomen is soft.     Tenderness: There is abdominal tenderness.     Comments: Suprapubic TTP  Genitourinary:    Comments: Foley catheter in place, gross hematuria noted in the Foley bag, blood around the urethral meatus noted Musculoskeletal:        General: No swelling.     Cervical back: Neck supple.  Skin:    General: Skin is warm and dry.     Capillary Refill: Capillary refill takes less than 2 seconds.  Neurological:     Mental Status: He is alert.  Psychiatric:        Mood and Affect: Mood normal.     ED Results / Procedures / Treatments   Labs (all labs ordered are listed, but only abnormal results are displayed) Labs Reviewed  CBC WITH DIFFERENTIAL/PLATELET - Abnormal; Notable for  the following components:      Result Value   WBC 15.0 (*)    RBC 3.85 (*)    Hemoglobin 11.9 (*)    HCT 34.9 (*)    Platelets 147 (*)    Neutro Abs 11.9 (*)    Monocytes Absolute 2.0 (*)    Abs Immature Granulocytes 0.29 (*)    All other components within normal limits  BASIC METABOLIC PANEL - Abnormal; Notable for the following components:   Sodium 134 (*)    CO2 20 (*)    Glucose, Bld 142 (*)    All other components within normal limits  PROTIME-INR - Abnormal; Notable for the following components:   Prothrombin Time 16.2 (*)    INR 1.3 (*)    All other components within normal limits  URINALYSIS, W/ REFLEX TO CULTURE (INFECTION SUSPECTED) - Abnormal; Notable for the following components:    Color, Urine BROWN (*)    APPearance HAZY (*)    Hgb urine dipstick LARGE (*)    Ketones, ur 15 (*)    Protein, ur 100 (*)    Leukocytes,Ua LARGE (*)    Bacteria, UA RARE (*)    Non Squamous Epithelial 0-5 (*)    All other components within normal limits  URINE CULTURE  CULTURE, BLOOD (ROUTINE X 2)  CULTURE, BLOOD (ROUTINE X 2)    EKG None  Radiology No results found.  Procedures Procedures    Medications Ordered in ED Medications  lactated ringers bolus 1,000 mL (has no administration in time range)  cefTRIAXone (ROCEPHIN) 1 g in sodium chloride 0.9 % 100 mL IVPB (has no administration in time range)    ED Course/ Medical Decision Making/ A&P                             Medical Decision Making Amount and/or Complexity of Data Reviewed Labs: ordered.  Risk Prescription drug management. Decision regarding hospitalization.    77 year old male with medical history significant for BPH, recent episode of urinary retention requiring Foley catheter placement, concern for possible rectal neoplasm on recent MRI imaging vs locally invasive prostate neoplasm with a mass noted in the prostate and enlarged lymph nodes noted around the rectum who presents to the emergency department with concern for a clogged Foley catheter.  The patient noted that his Foley catheter was draining as of last night.  He woke up this morning with blood in his urine and an inability for his catheter to drain.  He endorses suprapubic abdominal pain.  He is scheduled for PET guided prostate biopsy next week.  On arrival, the patient was tachycardic heart rate 108, in the setting of pain, hypertensive 199/106, saturating 100% on room air.  His Foley catheter was emergently replaced due to concern for clot from hematuria that was notably gross on exam.  Following replacement of his Foley catheter, 1100 cc of dark blood in urine was obtained.  Spoke with Dr. Alinda Money of [facility] who recommended  irrigation of the patient's new Foley until improvement noted.  If persistent hematuria noted, the patient may need continuous bladder irrigation and admission.  The patient was irrigated with 750 cc sterile water over 45 minutes and notably gross hematuria resolved, pink tinged urine was draining without issue.   Laboratory evaluation significant for CBC with leukocytosis to 15, hemoglobin of 11.9, decreased from 12.6 on 06/16/2022.  The patient had also no leukocytosis during measurement of his  CBC on that day. He is currently meeting SIRS criteria repeat evaluation with a heart rate in the 120s upon standing.  He has a leukocytosis, feels chills and has bacteria in his urine with an indwelling Foley catheter.  Given this concern, IV access was obtained and the patient was administered an IV fluid bolus, IV Rocephin and hospitalist medicine was consulted for admission for observation, Dr. Bonner Puna accepting.   Final Clinical Impression(s) / ED Diagnoses Final diagnoses:  Problem with Foley catheter, initial encounter ([place])  Gross hematuria  Urinary tract infection associated with indwelling urethral catheter, initial encounter ([place])  SIRS (systemic inflammatory response syndrome) ([place])    Rx / DC Orders ED Discharge Orders          Ordered    cephALEXin (KEFLEX) 500 MG capsule  4 times daily        07/02/22 1044              Regan Lemming, MD 07/02/22 1145    Regan Lemming, MD 07/02/22 1222

## 2022-07-03 DIAGNOSIS — N179 Acute kidney failure, unspecified: Secondary | ICD-10-CM | POA: Diagnosis not present

## 2022-07-03 DIAGNOSIS — I420 Dilated cardiomyopathy: Secondary | ICD-10-CM | POA: Diagnosis present

## 2022-07-03 DIAGNOSIS — R935 Abnormal findings on diagnostic imaging of other abdominal regions, including retroperitoneum: Secondary | ICD-10-CM | POA: Diagnosis not present

## 2022-07-03 DIAGNOSIS — T83091A Other mechanical complication of indwelling urethral catheter, initial encounter: Secondary | ICD-10-CM | POA: Diagnosis present

## 2022-07-03 DIAGNOSIS — Y846 Urinary catheterization as the cause of abnormal reaction of the patient, or of later complication, without mention of misadventure at the time of the procedure: Secondary | ICD-10-CM | POA: Diagnosis present

## 2022-07-03 DIAGNOSIS — I255 Ischemic cardiomyopathy: Secondary | ICD-10-CM | POA: Diagnosis not present

## 2022-07-03 DIAGNOSIS — I5021 Acute systolic (congestive) heart failure: Secondary | ICD-10-CM | POA: Diagnosis not present

## 2022-07-03 DIAGNOSIS — R591 Generalized enlarged lymph nodes: Secondary | ICD-10-CM | POA: Diagnosis present

## 2022-07-03 DIAGNOSIS — R7881 Bacteremia: Secondary | ICD-10-CM

## 2022-07-03 DIAGNOSIS — N390 Urinary tract infection, site not specified: Secondary | ICD-10-CM

## 2022-07-03 DIAGNOSIS — Z79899 Other long term (current) drug therapy: Secondary | ICD-10-CM | POA: Diagnosis not present

## 2022-07-03 DIAGNOSIS — I9581 Postprocedural hypotension: Secondary | ICD-10-CM | POA: Diagnosis not present

## 2022-07-03 DIAGNOSIS — R531 Weakness: Secondary | ICD-10-CM | POA: Diagnosis not present

## 2022-07-03 DIAGNOSIS — D638 Anemia in other chronic diseases classified elsewhere: Secondary | ICD-10-CM | POA: Diagnosis not present

## 2022-07-03 DIAGNOSIS — R310 Gross hematuria: Secondary | ICD-10-CM | POA: Diagnosis present

## 2022-07-03 DIAGNOSIS — R000 Tachycardia, unspecified: Secondary | ICD-10-CM | POA: Diagnosis not present

## 2022-07-03 DIAGNOSIS — B9561 Methicillin susceptible Staphylococcus aureus infection as the cause of diseases classified elsewhere: Secondary | ICD-10-CM | POA: Diagnosis present

## 2022-07-03 DIAGNOSIS — Z87891 Personal history of nicotine dependence: Secondary | ICD-10-CM | POA: Diagnosis not present

## 2022-07-03 DIAGNOSIS — R0989 Other specified symptoms and signs involving the circulatory and respiratory systems: Secondary | ICD-10-CM | POA: Diagnosis not present

## 2022-07-03 DIAGNOSIS — I10 Essential (primary) hypertension: Secondary | ICD-10-CM | POA: Diagnosis not present

## 2022-07-03 DIAGNOSIS — R972 Elevated prostate specific antigen [PSA]: Secondary | ICD-10-CM | POA: Diagnosis present

## 2022-07-03 DIAGNOSIS — I5022 Chronic systolic (congestive) heart failure: Secondary | ICD-10-CM | POA: Diagnosis present

## 2022-07-03 DIAGNOSIS — E039 Hypothyroidism, unspecified: Secondary | ICD-10-CM | POA: Diagnosis present

## 2022-07-03 DIAGNOSIS — N401 Enlarged prostate with lower urinary tract symptoms: Secondary | ICD-10-CM | POA: Diagnosis present

## 2022-07-03 DIAGNOSIS — N3289 Other specified disorders of bladder: Secondary | ICD-10-CM | POA: Diagnosis not present

## 2022-07-03 DIAGNOSIS — N400 Enlarged prostate without lower urinary tract symptoms: Secondary | ICD-10-CM | POA: Diagnosis not present

## 2022-07-03 DIAGNOSIS — T83511A Infection and inflammatory reaction due to indwelling urethral catheter, initial encounter: Secondary | ICD-10-CM | POA: Diagnosis not present

## 2022-07-03 DIAGNOSIS — I509 Heart failure, unspecified: Secondary | ICD-10-CM | POA: Diagnosis not present

## 2022-07-03 DIAGNOSIS — I110 Hypertensive heart disease with heart failure: Secondary | ICD-10-CM | POA: Diagnosis present

## 2022-07-03 DIAGNOSIS — K5900 Constipation, unspecified: Secondary | ICD-10-CM | POA: Diagnosis not present

## 2022-07-03 DIAGNOSIS — R338 Other retention of urine: Secondary | ICD-10-CM | POA: Diagnosis present

## 2022-07-03 DIAGNOSIS — D62 Acute posthemorrhagic anemia: Secondary | ICD-10-CM | POA: Diagnosis not present

## 2022-07-03 DIAGNOSIS — N3001 Acute cystitis with hematuria: Secondary | ICD-10-CM | POA: Diagnosis not present

## 2022-07-03 DIAGNOSIS — I351 Nonrheumatic aortic (valve) insufficiency: Secondary | ICD-10-CM | POA: Diagnosis not present

## 2022-07-03 DIAGNOSIS — Z79890 Hormone replacement therapy (postmenopausal): Secondary | ICD-10-CM | POA: Diagnosis not present

## 2022-07-03 LAB — BLOOD CULTURE ID PANEL (REFLEXED) - BCID2

## 2022-07-03 LAB — BASIC METABOLIC PANEL
Anion gap: 7 (ref 5–15)
BUN: 17 mg/dL (ref 8–23)
CO2: 23 mmol/L (ref 22–32)
Calcium: 8.2 mg/dL — ABNORMAL LOW (ref 8.9–10.3)
Chloride: 107 mmol/L (ref 98–111)
Creatinine, Ser: 1.05 mg/dL (ref 0.61–1.24)
GFR, Estimated: >60 mL/min (ref >60)
Glucose, Bld: 98 mg/dL (ref 70–99)
Potassium: 4 mmol/L (ref 3.5–5.1)
Sodium: 137 mmol/L (ref 135–145)

## 2022-07-03 LAB — CBC
HCT: 32.4 % — ABNORMAL LOW (ref 39.0–52.0)
Hemoglobin: 10.5 g/dL — ABNORMAL LOW (ref 13.0–17.0)
MCH: 30.9 pg (ref 26.0–34.0)
MCHC: 32.4 g/dL (ref 30.0–36.0)
MCV: 95.3 fL (ref 80.0–100.0)
Platelets: 140 10*3/uL — ABNORMAL LOW (ref 150–400)
RBC: 3.4 MIL/uL — ABNORMAL LOW (ref 4.22–5.81)
RDW: 14.4 % (ref 11.5–15.5)
WBC: 13.5 10*3/uL — ABNORMAL HIGH (ref 4.0–10.5)
nRBC: 0 % (ref 0.0–0.2)

## 2022-07-03 MED ORDER — OXYBUTYNIN CHLORIDE ER 5 MG PO TB24
5.0000 mg | ORAL_TABLET | Freq: Every day | ORAL | Status: DC
  Administered 2022-07-03 – 2022-07-10 (×8): 5 mg via ORAL
  Filled 2022-07-03 (×8): qty 1

## 2022-07-03 MED ORDER — FINASTERIDE 5 MG PO TABS
5.0000 mg | ORAL_TABLET | Freq: Two times a day (BID) | ORAL | Status: DC
  Administered 2022-07-03 – 2022-07-10 (×14): 5 mg via ORAL
  Filled 2022-07-03 (×14): qty 1

## 2022-07-03 MED ORDER — CEFAZOLIN SODIUM-DEXTROSE 2-4 GM/100ML-% IV SOLN
2.0000 g | Freq: Three times a day (TID) | INTRAVENOUS | Status: DC
  Administered 2022-07-03 – 2022-07-10 (×22): 2 g via INTRAVENOUS
  Filled 2022-07-03 (×26): qty 100

## 2022-07-03 MED ORDER — TAMSULOSIN HCL 0.4 MG PO CAPS
0.4000 mg | ORAL_CAPSULE | Freq: Two times a day (BID) | ORAL | Status: DC
  Administered 2022-07-03 – 2022-07-10 (×14): 0.4 mg via ORAL
  Filled 2022-07-03 (×14): qty 1

## 2022-07-03 NOTE — Progress Notes (Cosign Needed)
  Transition of Care [facility]) Screening Note   Patient Details  Name: Steven Carter Date of Birth: Mar 13, 1946   [facility] CM/SW Contact:    Henrietta Dine, RN Phone Number: 07/03/2022, 3:05 PM    Transition of Care Department [facility]) has reviewed patient and no TOC needs have been identified at this time. We will continue to monitor patient advancement through interdisciplinary progression rounds. If new patient transition needs arise, please place a TOC consult.

## 2022-07-03 NOTE — Progress Notes (Signed)
Patient transferred to room #1416. Re[port given to [facility]. Patient is alert and oriented, No any complaints during the transfer.

## 2022-07-03 NOTE — Consult Note (Signed)
Urology Consult   Physician requesting consult: Dr. Grandville Silos  Reason for consult: Hematuria and urinary retention  History of Present Illness: Steven Carter is a 77 y.o. with a history of BPH followed by Dr. Windell Norfolk. He has chronically been managed with finasteride and tamsulosin.  He ran out of tamsulosin for a few days in mid February and developed urinary retention on 2/20 requiring catheter placement. He was seen by Dr. Abner Greenspan on 2/27 and a voiding trial was performed.  He required catheter replacement after again developing retention.  He also has an elevated PSA and abnormal prostate MRI with a suspicious lesion for prostate cancer but also lymphadenopathy that would be unusual for prostate cancer and may indicate a GI cancer such as rectal cancer.  He was scheduled to have an MR/US fusion biopsy on 3/5 and has a pending colorectal surgery consultation.  He developed hematuria with clot urinary retention yesterday causing him to present to [place].  His catheter was irrigated by the ER staff and his urine mostly cleared.  He was to be sent home but developed fever and tachycardia.  He was admitted to [facility] and blood cultures are positive for MSSA.  His catheter has been draining well since he came to the hospital but he has had some bloody drainage around the catheter.  Past Medical History:  Diagnosis Date   BPH (benign prostatic hyperplasia)    Hypothyroidism    Thyroid disease     History reviewed. No pertinent surgical history.  Medications:  Home meds:  No current facility-administered medications on file prior to encounter.   Current Outpatient Medications on File Prior to Encounter  Medication Sig Dispense Refill   finasteride (PROSCAR) 5 MG tablet Take 5 mg by mouth daily.     levofloxacin (LEVAQUIN) 750 MG tablet Take 750 mg by mouth daily.     levothyroxine (SYNTHROID) 112 MCG tablet Take 112 mcg by mouth daily.     oxybutynin (DITROPAN-XL) 5 MG  24 hr tablet Take 5 mg by mouth daily.     tamsulosin (FLOMAX) 0.4 MG CAPS capsule Take 0.8 mg by mouth daily.     gentamicin cream (GARAMYCIN) 0.1 % Apply 1 application topically 2 (two) times daily. (Patient not taking: Reported on 07/02/2022) 15 g 1   meloxicam (MOBIC) 15 MG tablet Take 15 mg by mouth daily as needed. (Patient not taking: Reported on 07/02/2022)       Scheduled Meds:  docusate sodium  100 mg Oral BID   finasteride  5 mg Oral Daily   levothyroxine  112 mcg Oral Daily   tamsulosin  0.4 mg Oral Daily   Continuous Infusions:  0.9 % NaCl with KCl 20 mEq / L 75 mL/hr at 07/02/22 1548    ceFAZolin (ANCEF) IV 2 g (07/03/22 0729)   PRN Meds:.acetaminophen **OR** acetaminophen, hydrALAZINE, ondansetron **OR** ondansetron (ZOFRAN) IV, polyethylene glycol  Allergies: No Known Allergies  History reviewed. No pertinent family history.  Social History:  reports that he has never smoked. He has never used smokeless tobacco. He reports current alcohol use of about 7.0 standard drinks of alcohol per week. He reports that he does not use drugs.  ROS: A complete review of systems was performed.  All systems are negative except for pertinent findings as noted.  Physical Exam:  Vital signs in last 24 hours: Temp:  [98.3 F (36.8 C)-99.2 F (37.3 C)] 99.2 F (37.3 C) (03/03 0943) Pulse Rate:  [86-101] 94 (03/03 0943)  Resp:  [16-20] 17 (03/03 0519) BP: (142-168)/(70-91) 158/74 (03/03 0943) SpO2:  [96 %-100 %] 98 % (03/03 0943) Weight:  [99.7 kg] 99.7 kg (03/03 0519) Constitutional:  Alert and oriented, No acute distress Cardiovascular: No JVD Respiratory: Normal respiratory effort GI: Abdomen is soft, nontender. Genitourinary: No CVAT. Normal male phallus, testes are descended bilaterally and non-tender and without masses, scrotum is normal in appearance without lesions or masses, perineum is normal on inspection.  Indwelling catheter is noted with pinkish urine in tubing and a  small amount of dried blood around the meatus. Neurologic: Grossly intact, no focal deficits Psychiatric: Normal mood and affect  Laboratory Data:  Recent Labs    07/02/22 0838 07/03/22 0424  WBC 15.0* 13.5*  HGB 11.9* 10.5*  HCT 34.9* 32.4*  PLT 147* 140*    Recent Labs    07/02/22 0838 07/03/22 0424  NA 134* 137  K 3.8 4.0  CL 100 107  GLUCOSE 142* 98  BUN 21 17  CALCIUM 9.2 8.2*  CREATININE 1.18 1.05     Results for orders placed or performed during the hospital encounter of 07/02/22 (from the past 24 hour(s))  Blood culture (routine x 2)     Status: None (Preliminary result)   Collection Time: 07/02/22 11:35 AM   Specimen: BLOOD  Result Value Ref Range   Specimen Description      BLOOD RIGHT ANTECUBITAL Performed at [facility], [address] [place], [place] [address]    Special Requests      BOTTLES DRAWN AEROBIC AND ANAEROBIC Blood Culture adequate volume Performed at [facility], [address] [place], [place] [address]    Culture  Setup Time      GRAM POSITIVE COCCI IN CLUSTERS IN BOTH AEROBIC AND ANAEROBIC BOTTLES Organism ID to follow CRITICAL RESULT CALLED TO, READ BACK BY AND VERIFIED WITH: M LILLISTON,PHARMD'@0555'$  07/03/22 [place] Performed at [facility], [place] [address] [place], [place] [address]    Culture GRAM POSITIVE COCCI    Report Status PENDING   Blood Culture ID Panel (Reflexed)     Status: Abnormal   Collection Time: 07/02/22 11:35 AM  Result Value Ref Range   Enterococcus faecalis NOT DETECTED NOT DETECTED   Enterococcus Faecium NOT DETECTED NOT DETECTED   Listeria monocytogenes NOT DETECTED NOT DETECTED   Staphylococcus species DETECTED (A) NOT DETECTED   Staphylococcus aureus (BCID) DETECTED (A) NOT DETECTED   Staphylococcus epidermidis NOT DETECTED NOT DETECTED   Staphylococcus lugdunensis NOT DETECTED NOT DETECTED   Streptococcus species NOT DETECTED NOT DETECTED   Streptococcus  agalactiae NOT DETECTED NOT DETECTED   Streptococcus pneumoniae NOT DETECTED NOT DETECTED   Streptococcus pyogenes NOT DETECTED NOT DETECTED   A.calcoaceticus-baumannii NOT DETECTED NOT DETECTED   Bacteroides fragilis NOT DETECTED NOT DETECTED   Enterobacterales NOT DETECTED NOT DETECTED   Enterobacter cloacae complex NOT DETECTED NOT DETECTED   Escherichia coli NOT DETECTED NOT DETECTED   Klebsiella aerogenes NOT DETECTED NOT DETECTED   Klebsiella oxytoca NOT DETECTED NOT DETECTED   Klebsiella pneumoniae NOT DETECTED NOT DETECTED   Proteus species NOT DETECTED NOT DETECTED   Salmonella species NOT DETECTED NOT DETECTED   Serratia marcescens NOT DETECTED NOT DETECTED   Haemophilus influenzae NOT DETECTED NOT DETECTED   Neisseria meningitidis NOT DETECTED NOT DETECTED   Pseudomonas aeruginosa NOT DETECTED NOT DETECTED   Stenotrophomonas maltophilia NOT DETECTED NOT DETECTED   Candida albicans NOT DETECTED NOT DETECTED   Candida auris NOT DETECTED NOT  DETECTED   Candida glabrata NOT DETECTED NOT DETECTED   Candida krusei NOT DETECTED NOT DETECTED   Candida parapsilosis NOT DETECTED NOT DETECTED   Candida tropicalis NOT DETECTED NOT DETECTED   Cryptococcus neoformans/gattii NOT DETECTED NOT DETECTED   Meth resistant mecA/C and MREJ NOT DETECTED NOT DETECTED  Blood culture (routine x 2)     Status: None (Preliminary result)   Collection Time: 07/02/22 11:42 AM   Specimen: BLOOD RIGHT HAND  Result Value Ref Range   Specimen Description      BLOOD RIGHT HAND Performed at [facility], [address] [place], [place] [address]    Special Requests      BOTTLES DRAWN AEROBIC AND ANAEROBIC Blood Culture adequate volume Performed at [facility], [address], [place], [place] [address]    Culture  Setup Time      GRAM POSITIVE COCCI IN CLUSTERS IN BOTH AEROBIC AND ANAEROBIC BOTTLES CRITICAL VALUE NOTED.  VALUE IS CONSISTENT WITH PREVIOUSLY  REPORTED AND CALLED VALUE. Performed at [facility], [place] [address] [place], [place] [address]    Culture GRAM POSITIVE COCCI    Report Status PENDING   Basic metabolic panel     Status: Abnormal   Collection Time: 07/03/22  4:24 AM  Result Value Ref Range   Sodium 137 135 - 145 mmol/L   Potassium 4.0 3.5 - 5.1 mmol/L   Chloride 107 98 - 111 mmol/L   CO2 23 22 - 32 mmol/L   Glucose, Bld 98 70 - 99 mg/dL   BUN 17 8 - 23 mg/dL   Creatinine, Ser 1.05 0.61 - 1.24 mg/dL   Calcium 8.2 (L) 8.9 - 10.3 mg/dL   GFR, Estimated >60 >60 mL/min   Anion gap 7 5 - 15  CBC     Status: Abnormal   Collection Time: 07/03/22  4:24 AM  Result Value Ref Range   WBC 13.5 (H) 4.0 - 10.5 K/uL   RBC 3.40 (L) 4.22 - 5.81 MIL/uL   Hemoglobin 10.5 (L) 13.0 - 17.0 g/dL   HCT 32.4 (L) 39.0 - 52.0 %   MCV 95.3 80.0 - 100.0 fL   MCH 30.9 26.0 - 34.0 pg   MCHC 32.4 30.0 - 36.0 g/dL   RDW 14.4 11.5 - 15.5 %   Platelets 140 (L) 150 - 400 K/uL   nRBC 0.0 0.0 - 0.2 %   Recent Results (from the past 240 hour(s))  Blood culture (routine x 2)     Status: None (Preliminary result)   Collection Time: 07/02/22 11:35 AM   Specimen: BLOOD  Result Value Ref Range Status   Specimen Description   Final    BLOOD RIGHT ANTECUBITAL Performed at [facility], [address] [place], [place] [address]    Special Requests   Final    BOTTLES DRAWN AEROBIC AND ANAEROBIC Blood Culture adequate volume Performed at [facility], [address] [place], [place] [address]    Culture  Setup Time   Final    GRAM POSITIVE COCCI IN CLUSTERS IN BOTH AEROBIC AND ANAEROBIC BOTTLES Organism ID to follow CRITICAL RESULT CALLED TO, READ BACK BY AND VERIFIED WITH: M LILLISTON,PHARMD'@0555'$  07/03/22 [place] Performed at [facility], [address] [place],  [address]    Culture [facility] POSITIVE COCCI  Final   Report Status PENDING  Incomplete  Blood Culture ID Panel (Reflexed)      Status: Abnormal   Collection Time: 07/02/22  11:35 AM  Result Value Ref Range Status   Enterococcus faecalis NOT DETECTED NOT DETECTED Final   Enterococcus Faecium NOT DETECTED NOT DETECTED Final   Listeria monocytogenes NOT DETECTED NOT DETECTED Final   Staphylococcus species DETECTED (A) NOT DETECTED Final    Comment: CRITICAL RESULT CALLED TO, READ BACK BY AND VERIFIED WITH: M LILLISTON,PHARMD'@0555'$  07/03/22 [place]    Staphylococcus aureus (BCID) DETECTED (A) NOT DETECTED Final    Comment: CRITICAL RESULT CALLED TO, READ BACK BY AND VERIFIED WITH: M LILLISTON,PHARMD'@0555'$  07/03/22 [place]    Staphylococcus epidermidis NOT DETECTED NOT DETECTED Final   Staphylococcus lugdunensis NOT DETECTED NOT DETECTED Final   Streptococcus species NOT DETECTED NOT DETECTED Final   Streptococcus agalactiae NOT DETECTED NOT DETECTED Final   Streptococcus pneumoniae NOT DETECTED NOT DETECTED Final   Streptococcus pyogenes NOT DETECTED NOT DETECTED Final   A.calcoaceticus-baumannii NOT DETECTED NOT DETECTED Final   Bacteroides fragilis NOT DETECTED NOT DETECTED Final   Enterobacterales NOT DETECTED NOT DETECTED Final   Enterobacter cloacae complex NOT DETECTED NOT DETECTED Final   Escherichia coli NOT DETECTED NOT DETECTED Final   Klebsiella aerogenes NOT DETECTED NOT DETECTED Final   Klebsiella oxytoca NOT DETECTED NOT DETECTED Final   Klebsiella pneumoniae NOT DETECTED NOT DETECTED Final   Proteus species NOT DETECTED NOT DETECTED Final   Salmonella species NOT DETECTED NOT DETECTED Final   Serratia marcescens NOT DETECTED NOT DETECTED Final   Haemophilus influenzae NOT DETECTED NOT DETECTED Final   Neisseria meningitidis NOT DETECTED NOT DETECTED Final   Pseudomonas aeruginosa NOT DETECTED NOT DETECTED Final   Stenotrophomonas maltophilia NOT DETECTED NOT DETECTED Final   Candida albicans NOT DETECTED NOT DETECTED Final   Candida auris NOT DETECTED NOT DETECTED Final   Candida glabrata NOT DETECTED NOT  DETECTED Final   Candida krusei NOT DETECTED NOT DETECTED Final   Candida parapsilosis NOT DETECTED NOT DETECTED Final   Candida tropicalis NOT DETECTED NOT DETECTED Final   Cryptococcus neoformans/gattii NOT DETECTED NOT DETECTED Final   Meth resistant mecA/C and MREJ NOT DETECTED NOT DETECTED Final    Comment: Performed at [facility], [address] [place], [place] [address]  Blood culture (routine x 2)     Status: None (Preliminary result)   Collection Time: 07/02/22 11:42 AM   Specimen: BLOOD RIGHT HAND  Result Value Ref Range Status   Specimen Description   Final    BLOOD RIGHT HAND Performed at [facility], [address] [place], [place] [address]    Special Requests   Final    BOTTLES DRAWN AEROBIC AND ANAEROBIC Blood Culture adequate volume Performed at [facility], [address], [place], [place] [address]    Culture  Setup Time   Final    GRAM POSITIVE COCCI IN CLUSTERS IN BOTH AEROBIC AND ANAEROBIC BOTTLES CRITICAL VALUE NOTED.  VALUE IS CONSISTENT WITH PREVIOUSLY REPORTED AND CALLED VALUE. Performed at [facility], [place] [address], [place], [place] [address]    Culture [facility] POSITIVE COCCI  Final   Report Status PENDING  Incomplete    Renal Function: Recent Labs    07/02/22 0838 07/03/22 0424  CREATININE 1.18 1.05   Estimated Creatinine Clearance: 75.5 mL/min (by C-G formula based on SCr of 1.05 mg/dL).  Procedure: I hand irrigated his catheter with no clots noted.  His urine remained light pink.  Impression/Recommendation Hematuria: Unclear etiology.  He will likely require outpatient evaluation after his acute hospitalization. Elevated PSA/abnormal prostate MRI:  His prostate biopsy for 3/5 will need to be delayed due to his acute infection. BPH/retention: Continue urethral catheter for now.  Continue Finasteride and tamsulosin.  I will make Dr. Abner Greenspan aware of his admission to follow up and to  reschedule his outpatient follow up.  Dutch Gray 07/03/2022, 11:24 AM    Pryor Curia MD  CC: Dr. Irine Seal

## 2022-07-03 NOTE — Progress Notes (Signed)
PHARMACY - PHYSICIAN COMMUNICATION CRITICAL VALUE ALERT - BLOOD CULTURE IDENTIFICATION (BCID)  Steven Carter is an 77 y.o. male who presented to [facility] on 07/02/2022 with a chief complaint of clogged foley catheter.   Assessment:   Patient with new foley catheter placed recently for urinary retention.  Currently undergoing prostate CA work-up.   He presented with clogged catheter, hematuria and lower pelvic pain.  3/4 Blood cx bottles + GPC; BCID + MSSA. Urine cx pending.   Name of physician (or Provider) Contacted: [name], NP  Current antibiotics: Rocephin 1gm IV q24h  Changes to prescribed antibiotics recommended:  D/C Rocephin Start Ancef 2gm IV q8h F/U ID recommendations   Results for orders placed or performed during the hospital encounter of 07/02/22  Blood Culture ID Panel (Reflexed) (Collected: 07/02/2022 11:35 AM)  Result Value Ref Range   Enterococcus faecalis NOT DETECTED NOT DETECTED   Enterococcus Faecium NOT DETECTED NOT DETECTED   Listeria monocytogenes NOT DETECTED NOT DETECTED   Staphylococcus species DETECTED (A) NOT DETECTED   Staphylococcus aureus (BCID) DETECTED (A) NOT DETECTED   Staphylococcus epidermidis NOT DETECTED NOT DETECTED   Staphylococcus lugdunensis NOT DETECTED NOT DETECTED   Streptococcus species NOT DETECTED NOT DETECTED   Streptococcus agalactiae NOT DETECTED NOT DETECTED   Streptococcus pneumoniae NOT DETECTED NOT DETECTED   Streptococcus pyogenes NOT DETECTED NOT DETECTED   A.calcoaceticus-baumannii NOT DETECTED NOT DETECTED   Bacteroides fragilis NOT DETECTED NOT DETECTED   Enterobacterales NOT DETECTED NOT DETECTED   Enterobacter cloacae complex NOT DETECTED NOT DETECTED   Escherichia coli NOT DETECTED NOT DETECTED   Klebsiella aerogenes NOT DETECTED NOT DETECTED   Klebsiella oxytoca NOT DETECTED NOT DETECTED   Klebsiella pneumoniae NOT DETECTED NOT DETECTED   Proteus species NOT DETECTED NOT DETECTED   Salmonella species NOT  DETECTED NOT DETECTED   Serratia marcescens NOT DETECTED NOT DETECTED   Haemophilus influenzae NOT DETECTED NOT DETECTED   Neisseria meningitidis NOT DETECTED NOT DETECTED   Pseudomonas aeruginosa NOT DETECTED NOT DETECTED   Stenotrophomonas maltophilia NOT DETECTED NOT DETECTED   Candida albicans NOT DETECTED NOT DETECTED   Candida auris NOT DETECTED NOT DETECTED   Candida glabrata NOT DETECTED NOT DETECTED   Candida krusei NOT DETECTED NOT DETECTED   Candida parapsilosis NOT DETECTED NOT DETECTED   Candida tropicalis NOT DETECTED NOT DETECTED   Cryptococcus neoformans/gattii NOT DETECTED NOT DETECTED   Meth resistant mecA/C and MREJ NOT DETECTED NOT DETECTED    Netta Cedars PharmD 07/03/2022  6:10 AM

## 2022-07-03 NOTE — Progress Notes (Signed)
PROGRESS NOTE    Steven Carter  [id number] DOB: [birth year] DOA: 07/02/2022 PCP: [facility]    Chief Complaint  Patient presents with   Foley Catheter Problem    Brief Narrative:  Patient 77 year old gentleman history of BPH, hypothyroidism admitted with gross hematuria.  Noted to have had Foley catheter placed by urology at the end of February in preparation for PET scan and prostate biopsy this coming week.  Patient doing well but woke up on the morning of admission with clogged Foley and blood in Foley bag as well as lower pelvic pain.  Foley was replaced at the [facility] ED patient was feeling much better however due to initial tachycardia and hypertension ED felt uncomfortable and patient admitted for [facility] for further evaluation.  Workup done included blood cultures which came back positive for MSSA bacteremia.  Patient placed on IV antibiotics.  Urine cultures ordered and pending.  Urology and ID consulted for further evaluation and management.   Assessment & Plan:   Principal Problem:   UTI (urinary tract infection) Active Problems:   MSSA bacteremia   Gross hematuria   Tachycardia   Benign prostatic hyperplasia   Elevated PSA   Hypertension   #1 UTI -Patient presented with hematuria, noted to be tachycardic and hypertensive. -[facility]oley catheter [facility] in [facility] ED [facility]. -Urinalysis consistent with UTI. -Urine cultures with > 100,000 colonies of staph. -Blood cultures done consistent with MSSA bacteremia. -Patient noted to have some blood around urethral catheter/urethral meatus and as such urology consulted and following. -IV fluids, supportive care.  2.  MSSA bacteremia -Likely seeded from urine. -Likely etiology of tachycardia and hypertension. -ID consulted and feels MSSA bacteremia may have been secondary to recent urologic intervention and probable UTI. -Patient on IV antibiotics of Ancef. -ID recommending 2D echo and may  possibly need a TEE. -Repeat blood cultures ordered for tomorrow per ID. -ID following and appreciate input and recommendations.  3.  Hypertension/tachycardia -Likely secondary to problems #1 and 2. -Improved.  4.  Hematuria -??  Etiology -Patient seen by urology and recommended outpatient evaluation after acute hospitalization.  5.  Elevated PSA/abnormal prostate MRI -Patient assessed by urology noted prostate biopsy scheduled for 07/05/2022 will need to be delayed due to his acute infection.  6.  BPH/retention -Foley catheter in place, urology assessed patient recommending continuation of Foley catheter. -Patient states finasteride and tamsulosin dose recently increased to twice daily and as such we will place back on home regimen of twice daily. -Per urology.   DVT prophylaxis: SCDs Code Status: Full Family Communication: Updated patient and wife at bedside. Disposition: Likely home when clinically improved and cleared by ID.  Status is: Inpatient The patient will require care spanning > 2 midnights and should be moved to inpatient because: Severity of illness   Consultants:  Urology: Dr. Alinda Money 07/03/2022 ID: Dr.Comer 07/03/2022  Procedures:  None  Antimicrobials:  IV Ancef 07/03/2022>>> IV Rocephin 07/02/2022 x 1 dose   Subjective: Patient sitting up in bed.  Wife at bedside.  Denies any chest pain.  No shortness of breath.  No abdominal pain.  Noted to have some bleeding around urethral catheter site.  Does endorse some chills early on today.  Wife feels patient is underplaying  his symptoms.  Objective: Vitals:   07/03/22 0519 07/03/22 0943 07/03/22 1256 07/03/22 1700  BP: (!) 155/78 (!) 158/74 122/65   Pulse: 92 94 91   Resp: 17     Temp: 98.8 F (37.1  C) 99.2 F (37.3 C) 98.2 F (36.8 C) 100 F (37.8 C)  TempSrc: Oral Oral Oral Oral  SpO2: 96% 98% 95%   Weight: 99.7 kg     Height: '6\' 1"'$  (1.854 m)       Intake/Output Summary (Last 24 hours) at 07/03/2022  1906 Last data filed at 07/03/2022 1803 Gross per 24 hour  Intake 2646.27 ml  Output 2250 ml  Net 396.27 ml   Filed Weights   07/03/22 0519  Weight: 99.7 kg    Examination:  General exam: Appears calm and comfortable  Respiratory system: Clear to auscultation anterior lung fields.  No wheezes, no crackles, no rhonchi.  Fair air movement.  Speaking in full sentences.Marland Kitchen Respiratory effort normal. Cardiovascular system: Tachycardia.  No JVD, no murmurs rubs or gallops.  No lower extremity edema.  Gastrointestinal system: Abdomen is nondistended, soft and nontender. No organomegaly or masses felt. Normal bowel sounds heard. Genitourinary: Indwelling Foley catheter in place with pinkish urine in the tubing, small amount of dried blood around meatus. Central nervous system: Alert and oriented. No focal neurological deficits. Extremities: Symmetric 5 x 5 power. Skin: No rashes, lesions or ulcers Psychiatry: Judgement and insight appear normal. Mood & affect appropriate.     Data Reviewed: I have personally reviewed following labs and imaging studies  CBC: Recent Labs  Lab 07/02/22 0838 07/03/22 0424  WBC 15.0* 13.5*  NEUTROABS 11.9*  --   HGB 11.9* 10.5*  HCT 34.9* 32.4*  MCV 90.6 95.3  PLT 147* 140*    Basic Metabolic Panel: Recent Labs  Lab 07/02/22 0838 07/03/22 0424  NA 134* 137  K 3.8 4.0  CL 100 107  CO2 20* 23  GLUCOSE 142* 98  BUN 21 17  CREATININE 1.18 1.05  CALCIUM 9.2 8.2*    GFR: Estimated Creatinine Clearance: 75.5 mL/min (by C-G formula based on SCr of 1.05 mg/dL).  Liver Function Tests: No results for input(s): "AST", "ALT", "ALKPHOS", "BILITOT", "PROT", "ALBUMIN" in the last 168 hours.  CBG: No results for input(s): "GLUCAP" in the last 168 hours.   Recent Results (from the past 240 hour(s))  Urine Culture     Status: Abnormal (Preliminary result)   Collection Time: 07/02/22  9:42 AM   Specimen: Urine, Random  Result Value Ref Range Status    Specimen Description   Final    URINE, RANDOM Performed at [facility], [address], [place], [place] [address]    Special Requests   Final    NONE Reflexed from [id number] Performed at [facility], [address] [place], [place] [address]    Culture (A)  Final    >=100,000 COLONIES/mL STAPHYLOCOCCUS AUREUS SUSCEPTIBILITIES TO FOLLOW Performed at [facility] [address], [place], [place] [address]    Report Status PENDING  Incomplete  Blood culture (routine x 2)     Status: None (Preliminary result)   Collection Time: 07/02/22 11:35 AM   Specimen: BLOOD  Result Value Ref Range Status   Specimen Description   Final    BLOOD RIGHT ANTECUBITAL Performed at [facility], [address] [place], [place] [address]    Special Requests   Final    BOTTLES DRAWN AEROBIC AND ANAEROBIC Blood Culture adequate volume Performed at [facility], [address] [place], [place] [address]    Culture  Setup Time   Final    GRAM POSITIVE COCCI IN CLUSTERS IN BOTH AEROBIC AND ANAEROBIC BOTTLES Organism ID  to follow CRITICAL RESULT CALLED TO, READ BACK BY AND VERIFIED WITH: M LILLISTON,PHARMD'@0555'$  07/03/22 [place] Performed at [facility], [place] [address] [place], [place] [address]    Culture GRAM POSITIVE COCCI  Final   Report Status PENDING  Incomplete  Blood Culture ID Panel (Reflexed)     Status: Abnormal   Collection Time: 07/02/22 11:35 AM  Result Value Ref Range Status   Enterococcus faecalis NOT DETECTED NOT DETECTED Final   Enterococcus Faecium NOT DETECTED NOT DETECTED Final   Listeria monocytogenes NOT DETECTED NOT DETECTED Final   Staphylococcus species DETECTED (A) NOT DETECTED Final    Comment: CRITICAL RESULT CALLED TO, READ BACK BY AND VERIFIED WITH: M LILLISTON,PHARMD'@0555'$  07/03/22 [place]    Staphylococcus aureus (BCID) DETECTED (A) NOT DETECTED Final    Comment: CRITICAL RESULT  CALLED TO, READ BACK BY AND VERIFIED WITH: M LILLISTON,PHARMD'@0555'$  07/03/22 [place]    Staphylococcus epidermidis NOT DETECTED NOT DETECTED Final   Staphylococcus lugdunensis NOT DETECTED NOT DETECTED Final   Streptococcus species NOT DETECTED NOT DETECTED Final   Streptococcus agalactiae NOT DETECTED NOT DETECTED Final   Streptococcus pneumoniae NOT DETECTED NOT DETECTED Final   Streptococcus pyogenes NOT DETECTED NOT DETECTED Final   A.calcoaceticus-baumannii NOT DETECTED NOT DETECTED Final   Bacteroides fragilis NOT DETECTED NOT DETECTED Final   Enterobacterales NOT DETECTED NOT DETECTED Final   Enterobacter cloacae complex NOT DETECTED NOT DETECTED Final   Escherichia coli NOT DETECTED NOT DETECTED Final   Klebsiella aerogenes NOT DETECTED NOT DETECTED Final   Klebsiella oxytoca NOT DETECTED NOT DETECTED Final   Klebsiella pneumoniae NOT DETECTED NOT DETECTED Final   Proteus species NOT DETECTED NOT DETECTED Final   Salmonella species NOT DETECTED NOT DETECTED Final   Serratia marcescens NOT DETECTED NOT DETECTED Final   Haemophilus influenzae NOT DETECTED NOT DETECTED Final   Neisseria meningitidis NOT DETECTED NOT DETECTED Final   Pseudomonas aeruginosa NOT DETECTED NOT DETECTED Final   Stenotrophomonas maltophilia NOT DETECTED NOT DETECTED Final   Candida albicans NOT DETECTED NOT DETECTED Final   Candida auris NOT DETECTED NOT DETECTED Final   Candida glabrata NOT DETECTED NOT DETECTED Final   Candida krusei NOT DETECTED NOT DETECTED Final   Candida parapsilosis NOT DETECTED NOT DETECTED Final   Candida tropicalis NOT DETECTED NOT DETECTED Final   Cryptococcus neoformans/gattii NOT DETECTED NOT DETECTED Final   Meth resistant mecA/C and MREJ NOT DETECTED NOT DETECTED Final    Comment: Performed at [facility], [address], [place], [place] [address]  Blood culture (routine x 2)     Status: None (Preliminary result)   Collection Time: 07/02/22 11:42 AM   Specimen: BLOOD  RIGHT HAND  Result Value Ref Range Status   Specimen Description   Final    BLOOD RIGHT HAND Performed at [facility], [address], [place], [place] [address]    Special Requests   Final    BOTTLES DRAWN AEROBIC AND ANAEROBIC Blood Culture adequate volume Performed at [facility], [address], [place], [place] [address]    Culture  Setup Time   Final    GRAM POSITIVE COCCI IN CLUSTERS IN BOTH AEROBIC AND ANAEROBIC BOTTLES CRITICAL VALUE NOTED.  VALUE IS CONSISTENT WITH PREVIOUSLY REPORTED AND CALLED VALUE. Performed at [facility], [place] [address] [place], [place] [address]    Culture [facility] POSITIVE COCCI  Final   Report Status PENDING  Incomplete  Radiology Studies: No results found.      Scheduled Meds:  docusate sodium  100 mg Oral BID   finasteride  5 mg Oral BID   levothyroxine  112 mcg Oral Daily   oxybutynin  5 mg Oral Daily   tamsulosin  0.4 mg Oral BID   Continuous Infusions:  0.9 % NaCl with KCl 20 mEq / L 75 mL/hr at 07/02/22 1548    ceFAZolin (ANCEF) IV 2 g (07/03/22 1610)     LOS: 0 days    Time spent: 40 minutes    Irine Seal, MD [facility]   To contact the attending provider between 7A-7P or the covering provider during after hours 7P-7A, please log into the web site [email or web address] and access using universal [place] password for that web site. If you do not have the password, please call the hospital operator.  07/03/2022, 7:06 PM

## 2022-07-03 NOTE — Progress Notes (Signed)
Patient has had increase bloody leakage around urethral catheter. Patient has not c/o pain or presser to abd. Pain urine color has gone from dark red to clear yellow off and on.

## 2022-07-03 NOTE — Consult Note (Signed)
[facility]       Reason for Consult: bacteremia    Referring Physician: CHAMP autoconsult  Principal Problem:   UTI (urinary tract infection) Active Problems:   Gross hematuria   Tachycardia   MSSA bacteremia    docusate sodium  100 mg Oral BID   finasteride  5 mg Oral Daily   levothyroxine  112 mcg Oral Daily   tamsulosin  0.4 mg Oral Daily    Recommendations: Cefazolin Tte, possible TEE Repeat cultures  Assessment: MSSA bacteremia with recent urologic intervention and positive blood and urine cultures for MSSA.     HPI: Steven Carter is a 77 y.o. male with a history of BPH with a recent history of urinary retention and foley placement and a finding of a suspicious prostate lesion and lymphadenopathy concerning for cancer, such as rectal cancer.  He developed gross hematuria and recent fever and chills and now with positive blood cultures with MSSA.  No back pain.  Wife at bedside.   Review of Systems:  Constitutional: positive for fevers and chills All other systems reviewed and are negative    Past Medical History:  Diagnosis Date   BPH (benign prostatic hyperplasia)    Hypothyroidism    Thyroid disease     Social History   Tobacco Use   Smoking status: Never   Smokeless tobacco: Never  Substance Use Topics   Alcohol use: Yes    Alcohol/week: 7.0 standard drinks of alcohol    Types: 7 Glasses of wine per week   Drug use: Never    History reviewed. No pertinent family history.  No Known Allergies  Physical Exam: Constitutional: in no apparent distress  Vitals:   07/03/22 0943 07/03/22 1256  BP: (!) 158/74 122/65  Pulse: 94 91  Resp:    Temp: 99.2 F (37.3 C) 98.2 F (36.8 C)  SpO2: 98% 95%   EYES: anicteric Respiratory: normal respiratory effort Musculoskeletal: no edema  Lab Results  Component Value Date   WBC 13.5 (H) 07/03/2022   HGB 10.5 (L) 07/03/2022   HCT 32.4 (L) 07/03/2022   MCV 95.3  07/03/2022   PLT 140 (L) 07/03/2022    Lab Results  Component Value Date   CREATININE 1.05 07/03/2022   BUN 17 07/03/2022   NA 137 07/03/2022   K 4.0 07/03/2022   CL 107 07/03/2022   CO2 23 07/03/2022   No results found for: "ALT", "AST", "GGT", "ALKPHOS"   Microbiology: Recent Results (from the past 240 hour(s))  Urine Culture     Status: Abnormal (Preliminary result)   Collection Time: 07/02/22  9:42 AM   Specimen: Urine, Random  Result Value Ref Range Status   Specimen Description   Final    URINE, RANDOM Performed at [facility], [address], [place], [place] [address]    Special Requests   Final    NONE Reflexed from [id number] Performed at [facility], [address], [place], [place] [address]    Culture (A)  Final    >=100,000 COLONIES/mL STAPHYLOCOCCUS AUREUS SUSCEPTIBILITIES TO FOLLOW Performed at [facility], [place] [address] [place], [place] [address]    Report Status PENDING  Incomplete  Blood culture (routine x 2)     Status: None (Preliminary result)   Collection Time: 07/02/22 11:35 AM   Specimen: BLOOD  Result Value Ref Range Status   Specimen Description   Final    BLOOD RIGHT ANTECUBITAL Performed  at [facility], [address] [place], [place] [address]    Special Requests   Final    BOTTLES DRAWN AEROBIC AND ANAEROBIC Blood Culture adequate volume Performed at [facility], [address] [place], [place] [address]    Culture  Setup Time   Final    GRAM POSITIVE COCCI IN CLUSTERS IN BOTH AEROBIC AND ANAEROBIC BOTTLES Organism ID to follow CRITICAL RESULT CALLED TO, READ BACK BY AND VERIFIED WITH: M LILLISTON,PHARMD'@0555'$  07/03/22 [place] Performed at [facility], [place] [address], [place], [place] [address]    Culture GRAM POSITIVE COCCI  Final   Report Status PENDING  Incomplete  Blood Culture ID Panel (Reflexed)     Status: Abnormal   Collection Time:  07/02/22 11:35 AM  Result Value Ref Range Status   Enterococcus faecalis NOT DETECTED NOT DETECTED Final   Enterococcus Faecium NOT DETECTED NOT DETECTED Final   Listeria monocytogenes NOT DETECTED NOT DETECTED Final   Staphylococcus species DETECTED (A) NOT DETECTED Final    Comment: CRITICAL RESULT CALLED TO, READ BACK BY AND VERIFIED WITH: M LILLISTON,PHARMD'@0555'$  07/03/22 [place]    Staphylococcus aureus (BCID) DETECTED (A) NOT DETECTED Final    Comment: CRITICAL RESULT CALLED TO, READ BACK BY AND VERIFIED WITH: M LILLISTON,PHARMD'@0555'$  07/03/22 [place]    Staphylococcus epidermidis NOT DETECTED NOT DETECTED Final   Staphylococcus lugdunensis NOT DETECTED NOT DETECTED Final   Streptococcus species NOT DETECTED NOT DETECTED Final   Streptococcus agalactiae NOT DETECTED NOT DETECTED Final   Streptococcus pneumoniae NOT DETECTED NOT DETECTED Final   Streptococcus pyogenes NOT DETECTED NOT DETECTED Final   A.calcoaceticus-baumannii NOT DETECTED NOT DETECTED Final   Bacteroides fragilis NOT DETECTED NOT DETECTED Final   Enterobacterales NOT DETECTED NOT DETECTED Final   Enterobacter cloacae complex NOT DETECTED NOT DETECTED Final   Escherichia coli NOT DETECTED NOT DETECTED Final   Klebsiella aerogenes NOT DETECTED NOT DETECTED Final   Klebsiella oxytoca NOT DETECTED NOT DETECTED Final   Klebsiella pneumoniae NOT DETECTED NOT DETECTED Final   Proteus species NOT DETECTED NOT DETECTED Final   Salmonella species NOT DETECTED NOT DETECTED Final   Serratia marcescens NOT DETECTED NOT DETECTED Final   Haemophilus influenzae NOT DETECTED NOT DETECTED Final   Neisseria meningitidis NOT DETECTED NOT DETECTED Final   Pseudomonas aeruginosa NOT DETECTED NOT DETECTED Final   Stenotrophomonas maltophilia NOT DETECTED NOT DETECTED Final   Candida albicans NOT DETECTED NOT DETECTED Final   Candida auris NOT DETECTED NOT DETECTED Final   Candida glabrata NOT DETECTED NOT DETECTED Final   Candida krusei NOT  DETECTED NOT DETECTED Final   Candida parapsilosis NOT DETECTED NOT DETECTED Final   Candida tropicalis NOT DETECTED NOT DETECTED Final   Cryptococcus neoformans/gattii NOT DETECTED NOT DETECTED Final   Meth resistant mecA/C and MREJ NOT DETECTED NOT DETECTED Final    Comment: Performed at [facility], [address] [place], [place] [address]  Blood culture (routine x 2)     Status: None (Preliminary result)   Collection Time: 07/02/22 11:42 AM   Specimen: BLOOD RIGHT HAND  Result Value Ref Range Status   Specimen Description   Final    BLOOD RIGHT HAND Performed at [facility], [address] [place], [place] [address]    Special Requests   Final    BOTTLES DRAWN AEROBIC AND ANAEROBIC Blood Culture adequate volume Performed at [facility], [address], [place], [place] [address]    Culture  Setup Time   Final    GRAM POSITIVE COCCI IN CLUSTERS IN BOTH AEROBIC AND ANAEROBIC BOTTLES CRITICAL VALUE NOTED.  VALUE IS CONSISTENT WITH PREVIOUSLY REPORTED AND CALLED VALUE. Performed at [facility], [place] [address], [place], [place] [address]    Culture GRAM POSITIVE COCCI  Final   Report Status PENDING  Incomplete    Thayer Headings, [place] [facility] [email or web address] 07/03/2022, 2:55 PM

## 2022-07-04 ENCOUNTER — Inpatient Hospital Stay (HOSPITAL_COMMUNITY): Payer: Medicare Other

## 2022-07-04 DIAGNOSIS — R000 Tachycardia, unspecified: Secondary | ICD-10-CM | POA: Diagnosis not present

## 2022-07-04 DIAGNOSIS — R7881 Bacteremia: Secondary | ICD-10-CM

## 2022-07-04 DIAGNOSIS — T83511A Infection and inflammatory reaction due to indwelling urethral catheter, initial encounter: Secondary | ICD-10-CM | POA: Diagnosis not present

## 2022-07-04 DIAGNOSIS — B9561 Methicillin susceptible Staphylococcus aureus infection as the cause of diseases classified elsewhere: Secondary | ICD-10-CM | POA: Diagnosis not present

## 2022-07-04 DIAGNOSIS — R310 Gross hematuria: Secondary | ICD-10-CM | POA: Diagnosis not present

## 2022-07-04 LAB — CBC WITH DIFFERENTIAL/PLATELET
Abs Immature Granulocytes: 0.2 10*3/uL — ABNORMAL HIGH (ref 0.00–0.07)
Basophils Absolute: 0 10*3/uL (ref 0.0–0.1)
Basophils Relative: 1 %
Eosinophils Absolute: 0.2 10*3/uL (ref 0.0–0.5)
Eosinophils Relative: 3 %
HCT: 32 % — ABNORMAL LOW (ref 39.0–52.0)
Hemoglobin: 10.2 g/dL — ABNORMAL LOW (ref 13.0–17.0)
Immature Granulocytes: 3 %
Lymphocytes Relative: 14 %
Lymphs Abs: 0.9 10*3/uL (ref 0.7–4.0)
MCH: 30.9 pg (ref 26.0–34.0)
MCHC: 31.9 g/dL (ref 30.0–36.0)
MCV: 97 fL (ref 80.0–100.0)
Monocytes Absolute: 1.4 10*3/uL — ABNORMAL HIGH (ref 0.1–1.0)
Monocytes Relative: 22 %
Neutro Abs: 3.7 10*3/uL (ref 1.7–7.7)
Neutrophils Relative %: 57 %
Platelets: 129 10*3/uL — ABNORMAL LOW (ref 150–400)
RBC: 3.3 MIL/uL — ABNORMAL LOW (ref 4.22–5.81)
RDW: 14.4 % (ref 11.5–15.5)
WBC: 6.3 10*3/uL (ref 4.0–10.5)
nRBC: 0 % (ref 0.0–0.2)

## 2022-07-04 LAB — BASIC METABOLIC PANEL
Anion gap: 8 (ref 5–15)
BUN: 14 mg/dL (ref 8–23)
CO2: 22 mmol/L (ref 22–32)
Calcium: 8 mg/dL — ABNORMAL LOW (ref 8.9–10.3)
Chloride: 106 mmol/L (ref 98–111)
Creatinine, Ser: 0.98 mg/dL (ref 0.61–1.24)
GFR, Estimated: >60 mL/min (ref >60)
Glucose, Bld: 110 mg/dL — ABNORMAL HIGH (ref 70–99)
Potassium: 3.9 mmol/L (ref 3.5–5.1)
Sodium: 136 mmol/L (ref 135–145)

## 2022-07-04 LAB — MAGNESIUM: Magnesium: 1.9 mg/dL (ref 1.7–2.4)

## 2022-07-04 LAB — ECHOCARDIOGRAM COMPLETE
Area-P 1/2: 4.39 cm2
Calc EF: 47.8 %
Est EF: 35
Height: 73 in
P 1/2 time: 495 msec
S' Lateral: 3.9 cm
Single Plane A2C EF: 46 %
Single Plane A4C EF: 45.4 %
Weight: 3516.8 oz

## 2022-07-04 LAB — URINE CULTURE: Culture: >=100000 — AB

## 2022-07-04 MED ORDER — CHLORHEXIDINE GLUCONATE CLOTH 2 % EX PADS
6.0000 | MEDICATED_PAD | Freq: Every day | CUTANEOUS | Status: DC
  Administered 2022-07-04 – 2022-07-06 (×3): 6 via TOPICAL

## 2022-07-04 MED ORDER — SORBITOL 70 % SOLN
30.0000 mL | Status: AC
  Administered 2022-07-04 (×2): 30 mL via ORAL
  Filled 2022-07-04 (×2): qty 30

## 2022-07-04 MED ORDER — BISACODYL 10 MG RE SUPP
10.0000 mg | Freq: Once | RECTAL | Status: AC
  Administered 2022-07-04: 10 mg via RECTAL
  Filled 2022-07-04: qty 1

## 2022-07-04 NOTE — Progress Notes (Signed)
PROGRESS NOTE    Steven Carter  [id number] DOB: 30-Jun-1945 DOA: 07/02/2022 PCP: [facility]    Chief Complaint  Patient presents with   Foley Catheter Problem    Brief Narrative:  Patient 77 year old gentleman history of BPH, hypothyroidism admitted with gross hematuria.  Noted to have had Foley catheter placed by urology at the end of February in preparation for PET scan and prostate biopsy this coming week.  Patient doing well but woke up on the morning of admission with clogged Foley and blood in Foley bag as well as lower pelvic pain.  Foley was replaced at the [facility] patient was feeling much better however due to initial tachycardia and hypertension ED felt uncomfortable and patient admitted for [facility] for further evaluation.  Workup done included blood cultures which came back positive for MSSA bacteremia.  Patient placed on IV antibiotics.  Urine cultures ordered and pending.  Urology and ID consulted for further evaluation and management.   Assessment & Plan:   Principal Problem:   UTI (urinary tract infection) Active Problems:   MSSA bacteremia   Gross hematuria   Tachycardia   Benign prostatic hyperplasia   Elevated PSA   Hypertension   #1 UTI -Patient presented with hematuria, noted to be tachycardic and hypertensive. -[facility] catheter [facility] in [facility] ED [facility]. -Urinalysis consistent with UTI. -Urine cultures with > 100,000 colonies of MSSA. -Blood cultures done consistent with MSSA bacteremia. -Patient noted to have some blood around urethral catheter/urethral meatus and as such urology consulted and following. -Urine around meatus resolved. -Continue empiric IV antibiotics of Ancef, IV fluids, supportive care.  2.  MSSA bacteremia -Likely seeded from urine. -Likely etiology of tachycardia and hypertension. -ID consulted and feels MSSA bacteremia may have been secondary to recent urologic intervention and probable  UTI. -Patient on IV antibiotics of Ancef. -ID recommending 2D echo and may possibly need a TEE. -2D echo ordered and pending. -Repeat blood cultures ordered for today and pending per ID. -ID following and appreciate input and recommendations.  3.  Hypertension/tachycardia -Likely secondary to problems #1 and 2. -Improved.  4.  Hematuria -??  Etiology -Hematuria improved. -Patient seen by urology and recommended outpatient evaluation after acute hospitalization for cystoscopy and [place] evaluation.  5.  Elevated PSA/abnormal prostate MRI -Patient assessed by urology noted prostate biopsy scheduled for 07/05/2022 will need to be delayed due to his acute infection. -Per urology.  6.  BPH/retention -Foley catheter in place, urology assessed patient recommending continuation of Foley catheter. -Patient states finasteride and tamsulosin dose recently increased to twice daily and as such patient placed on twice daily dosing.   -Urology following.   DVT prophylaxis: SCDs Code Status: Full Family Communication: Updated patient.  No family at bedside.   Disposition: Likely home when clinically improved and cleared by ID.  Status is: Inpatient The patient will require care spanning > 2 midnights and should be moved to inpatient because: Severity of illness   Consultants:  Urology: Dr. Alinda Money 07/03/2022 ID: Dr.Comer 07/03/2022  Procedures:  2D echo pending  Antimicrobials:  IV Ancef 07/03/2022>>> IV Rocephin 07/02/2022 x 1 dose   Subjective: Sitting up in recliner.  States he feels better overall.  Denies any chest pain.  No shortness of breath.  No abdominal pain.  Denies any bleeding.  States urine in Foley catheter bag has cleared up.  No family at bedside.  Objective: Vitals:   07/03/22 2013 07/04/22 0002 07/04/22 0351 07/04/22 0748  BP: Marland Kitchen)  153/77 (!) 145/78 (!) 144/85 (!) 149/90  Pulse: 94 82 75 80  Resp: '19 20 19 18  '$ Temp: 99.2 F (37.3 C) 99.3 F (37.4 C) 98.8 F (37.1 C) 98.7  F (37.1 C)  TempSrc: Oral Oral Oral Oral  SpO2: 96% 96% 97% 96%  Weight:      Height:        Intake/Output Summary (Last 24 hours) at 07/04/2022 1054 Last data filed at 07/04/2022 0910 Gross per 24 hour  Intake 2161.71 ml  Output 2050 ml  Net 111.71 ml    Filed Weights   07/03/22 0519  Weight: 99.7 kg    Examination:  General exam: NAD Respiratory system: Lungs clear to auscultation bilaterally.  No wheezes, no crackles, no rhonchi.  Fair air movement.  Speaking in full sentences.  Normal respiratory effort.   Cardiovascular system: Regular rate rhythm no murmurs rubs or gallops.  No JVD.  No lower extremity edema.  Gastrointestinal system: Abdomen is soft, nontender, nondistended, positive bowel sounds.  No rebound.  No guarding.  Genitourinary: Indwelling Foley catheter in place with clear yellow urine.  No further blood noted around meatus.  Central nervous system: Alert and oriented. No focal neurological deficits. Extremities: Symmetric 5 x 5 power. Skin: No rashes, lesions or ulcers Psychiatry: Judgement and insight appear normal. Mood & affect appropriate.     Data Reviewed: I have personally reviewed following labs and imaging studies  CBC: Recent Labs  Lab 07/02/22 0838 07/03/22 0424 07/04/22 0448  WBC 15.0* 13.5* 6.3  NEUTROABS 11.9*  --  3.7  HGB 11.9* 10.5* 10.2*  HCT 34.9* 32.4* 32.0*  MCV 90.6 95.3 97.0  PLT 147* 140* 129*     Basic Metabolic Panel: Recent Labs  Lab 07/02/22 0838 07/03/22 0424 07/04/22 0448  NA 134* 137 136  K 3.8 4.0 3.9  CL 100 107 106  CO2 20* 23 22  GLUCOSE 142* 98 110*  BUN '21 17 14  '$ CREATININE 1.18 1.05 0.98  CALCIUM 9.2 8.2* 8.0*  MG  --   --  1.9     GFR: Estimated Creatinine Clearance: 80.9 mL/min (by C-G formula based on SCr of 0.98 mg/dL).  Liver Function Tests: No results for input(s): "AST", "ALT", "ALKPHOS", "BILITOT", "PROT", "ALBUMIN" in the last 168 hours.  CBG: No results for input(s): "GLUCAP"  in the last 168 hours.   Recent Results (from the past 240 hour(s))  Urine Culture     Status: Abnormal   Collection Time: 07/02/22  9:42 AM   Specimen: Urine, Random  Result Value Ref Range Status   Specimen Description   Final    URINE, RANDOM Performed at [facility], [address], [place], [place] [address]    Special Requests   Final    NONE Reflexed from [phone] [id number] Performed at [facility], [address] [place], [place] [address]    Culture >=100,000 COLONIES/mL STAPHYLOCOCCUS AUREUS (A)  Final   Report Status 07/04/2022 FINAL  Final   Organism ID, Bacteria STAPHYLOCOCCUS AUREUS (A)  Final      Susceptibility   Staphylococcus aureus - MIC*    CIPROFLOXACIN <=0.5 SENSITIVE Sensitive     GENTAMICIN <=0.5 SENSITIVE Sensitive     NITROFURANTOIN <=16 SENSITIVE Sensitive     OXACILLIN 0.5 SENSITIVE Sensitive     TETRACYCLINE <=1 SENSITIVE Sensitive     VANCOMYCIN <=0.5 SENSITIVE Sensitive     TRIMETH/SULFA <=10 SENSITIVE Sensitive     CLINDAMYCIN <=0.25 SENSITIVE Sensitive  RIFAMPIN <=0.5 SENSITIVE Sensitive     Inducible Clindamycin NEGATIVE Sensitive     * >=100,000 COLONIES/mL STAPHYLOCOCCUS AUREUS  Blood culture (routine x 2)     Status: None (Preliminary result)   Collection Time: 07/02/22 11:35 AM   Specimen: BLOOD  Result Value Ref Range Status   Specimen Description   Final    BLOOD RIGHT ANTECUBITAL Performed at [facility], [address] [place], [place] [address]    Special Requests   Final    BOTTLES DRAWN AEROBIC AND ANAEROBIC Blood Culture adequate volume Performed at [facility], [address] [place], [place] [address]    Culture  Setup Time   Final    GRAM POSITIVE COCCI IN CLUSTERS IN BOTH AEROBIC AND ANAEROBIC BOTTLES Organism ID to follow CRITICAL RESULT CALLED TO, READ BACK BY AND VERIFIED WITH: M LILLISTON,PHARMD'@0555'$  07/03/22 [place] Performed at [facility], [place] [address], [place], [place] [address]    Culture GRAM POSITIVE COCCI  Final   Report Status PENDING  Incomplete  Blood Culture ID Panel (Reflexed)     Status: Abnormal   Collection Time: 07/02/22 11:35 AM  Result Value Ref Range Status   Enterococcus faecalis NOT DETECTED NOT DETECTED Final   Enterococcus Faecium NOT DETECTED NOT DETECTED Final   Listeria monocytogenes NOT DETECTED NOT DETECTED Final   Staphylococcus species DETECTED (A) NOT DETECTED Final    Comment: CRITICAL RESULT CALLED TO, READ BACK BY AND VERIFIED WITH: M LILLISTON,PHARMD'@0555'$  07/03/22 [place]    Staphylococcus aureus (BCID) DETECTED (A) NOT DETECTED Final    Comment: CRITICAL RESULT CALLED TO, READ BACK BY AND VERIFIED WITH: M LILLISTON,PHARMD'@0555'$  07/03/22 [place]    Staphylococcus epidermidis NOT DETECTED NOT DETECTED Final   Staphylococcus lugdunensis NOT DETECTED NOT DETECTED Final   Streptococcus species NOT DETECTED NOT DETECTED Final   Streptococcus agalactiae NOT DETECTED NOT DETECTED Final   Streptococcus pneumoniae NOT DETECTED NOT DETECTED Final   Streptococcus pyogenes NOT DETECTED NOT DETECTED Final   A.calcoaceticus-baumannii NOT DETECTED NOT DETECTED Final   Bacteroides fragilis NOT DETECTED NOT DETECTED Final   Enterobacterales NOT DETECTED NOT DETECTED Final   Enterobacter cloacae complex NOT DETECTED NOT DETECTED Final   Escherichia coli NOT DETECTED NOT DETECTED Final   Klebsiella aerogenes NOT DETECTED NOT DETECTED Final   Klebsiella oxytoca NOT DETECTED NOT DETECTED Final   Klebsiella pneumoniae NOT DETECTED NOT DETECTED Final   Proteus species NOT DETECTED NOT DETECTED Final   Salmonella species NOT DETECTED NOT DETECTED Final   Serratia marcescens NOT DETECTED NOT DETECTED Final   Haemophilus influenzae NOT DETECTED NOT DETECTED Final   Neisseria meningitidis NOT DETECTED NOT DETECTED Final   Pseudomonas aeruginosa NOT DETECTED NOT DETECTED Final   Stenotrophomonas maltophilia  NOT DETECTED NOT DETECTED Final   Candida albicans NOT DETECTED NOT DETECTED Final   Candida auris NOT DETECTED NOT DETECTED Final   Candida glabrata NOT DETECTED NOT DETECTED Final   Candida krusei NOT DETECTED NOT DETECTED Final   Candida parapsilosis NOT DETECTED NOT DETECTED Final   Candida tropicalis NOT DETECTED NOT DETECTED Final   Cryptococcus neoformans/gattii NOT DETECTED NOT DETECTED Final   Meth resistant mecA/C and MREJ NOT DETECTED NOT DETECTED Final    Comment: Performed at [facility], [address], [place], [place] [address]  Blood culture (routine x 2)     Status: None (Preliminary result)   Collection Time: 07/02/22 11:42 AM   Specimen: BLOOD RIGHT HAND  Result  Value Ref Range Status   Specimen Description   Final    BLOOD RIGHT HAND Performed at [facility], [address], [place], [place] [address]    Special Requests   Final    BOTTLES DRAWN AEROBIC AND ANAEROBIC Blood Culture adequate volume Performed at [facility], [address], [place], [place] [address]    Culture  Setup Time   Final    GRAM POSITIVE COCCI IN CLUSTERS IN BOTH AEROBIC AND ANAEROBIC BOTTLES CRITICAL VALUE NOTED.  VALUE IS CONSISTENT WITH PREVIOUSLY REPORTED AND CALLED VALUE. Performed at [facility], [place] [address], [place], [place] [address]    Culture GRAM POSITIVE COCCI  Final   Report Status PENDING  Incomplete         Radiology Studies: No results found.      Scheduled Meds:  docusate sodium  100 mg Oral BID   finasteride  5 mg Oral BID   levothyroxine  112 mcg Oral Daily   oxybutynin  5 mg Oral Daily   tamsulosin  0.4 mg Oral BID   Continuous Infusions:  0.9 % NaCl with KCl 20 mEq / L 75 mL/hr at 07/04/22 0534    ceFAZolin (ANCEF) IV 2 g (07/04/22 0917)     LOS: 1 day    Time spent: 40 minutes    Irine Seal, MD [facility]   To contact the attending provider between 7A-7P or the  covering provider during after hours 7P-7A, please log into the web site [email or web address] and access using universal [place] password for that web site. If you do not have the password, please call the hospital operator.  07/04/2022, 10:54 AM

## 2022-07-04 NOTE — Progress Notes (Signed)
    [facility] for Infectious Disease    Date of Admission:  07/02/2022   Total days of antibiotics 3   ID: CHAISON GUILLORY is a 77 y.o. male with MSSA bacteremia with uti, urologic manipulation Principal Problem:   UTI (urinary tract infection) Active Problems:   Gross hematuria   Tachycardia   MSSA bacteremia   Benign prostatic hyperplasia   Elevated PSA   Hypertension    Subjective: Feeling better, afebrile  Medications:   Chlorhexidine Gluconate Cloth  6 each Topical Daily   docusate sodium  100 mg Oral BID   finasteride  5 mg Oral BID   levothyroxine  112 mcg Oral Daily   oxybutynin  5 mg Oral Daily   tamsulosin  0.4 mg Oral BID    Objective: Vital signs in last 24 hours: Temp:  [98.2 F (36.8 C)-100 F (37.8 C)] 98.2 F (36.8 C) (03/04 1303) Pulse Rate:  [75-94] 81 (03/04 1303) Resp:  [18-20] 18 (03/04 1303) BP: (144-159)/(76-90) 159/88 (03/04 1303) SpO2:  [96 %-98 %] 98 % (03/04 1303)  Physical Exam  Constitutional: He is oriented to person, place, and time. He appears well-developed and well-nourished. No distress.  HENT:  Mouth/Throat: Oropharynx is clear and moist. No oropharyngeal exudate.  Cardiovascular: Normal rate, regular rhythm and normal heart sounds. Exam reveals no gallop and no friction rub.  No murmur heard.  Pulmonary/Chest: Effort normal and breath sounds normal. No respiratory distress. He has no wheezes.  Abdominal: Soft. Bowel sounds are normal. He exhibits no distension. There is no tenderness.  Lymphadenopathy:  He has no cervical adenopathy.  Neurological: He is alert and oriented to person, place, and time.  Skin: Skin is warm and dry. No rash noted. No erythema.  Psychiatric: He has a normal mood and affect. His behavior is normal.    Lab Results Recent Labs    07/03/22 0424 07/04/22 0448  WBC 13.5* 6.3  HGB 10.5* 10.2*  HCT 32.4* 32.0*  NA 137 136  K 4.0 3.9  CL 107 106  CO2 23 22  BUN 17 14  CREATININE 1.05 0.98    Liver Panel No results for input(s): "PROT", "ALBUMIN", "AST", "ALT", "ALKPHOS", "BILITOT", "BILIDIR", "IBILI" in the last 72 hours. Sedimentation Rate No results for input(s): "ESRSEDRATE" in the last 72 hours. C-Reactive Protein No results for input(s): "CRP" in the last 72 hours.  Microbiology: 3/4 blood cx pending 3/2 blood cx 2/2 MSSA Studies/Results: No results found.   Assessment/Plan: MSSA bacteremia thought to be secondary to MSSA uti = continue on cefazolin. Recommend to get TEE to rule out endocarditis  Will follow up on repeat blood cx  Leukocytosis = improving  [facility] Pager: [phone]  07/04/2022, 4:19 PM

## 2022-07-04 NOTE — Progress Notes (Signed)
Echocardiogram 2D Echocardiogram has been performed.  Steven Carter 07/04/2022, 3:55 PM

## 2022-07-04 NOTE — Progress Notes (Signed)
Mobility Specialist - Progress Note   07/04/22 1137  Mobility  Activity Ambulated independently in hallway  Level of Assistance Independent  Assistive Device None  Distance Ambulated (ft) 500 ft  Range of Motion/Exercises Active  Activity Response Tolerated well  $Mobility charge 1 Mobility   Pt was found on recliner chair and agreeable to ambulate. Had no complaints and at EOS returned to bed with all necessities in reach.  Ferd Hibbs Mobility Specialist

## 2022-07-04 NOTE — Progress Notes (Signed)
  Subjective: Denies pain. Tolerating foley. Foley is clear yellow. Temp curve downtrending.  Objective: Vital signs in last 24 hours: Temp:  [98.2 F (36.8 C)-100 F (37.8 C)] 98.7 F (37.1 C) (03/04 0748) Pulse Rate:  [75-94] 80 (03/04 0748) Resp:  [18-20] 18 (03/04 0748) BP: (122-158)/(65-90) 149/90 (03/04 0748) SpO2:  [95 %-98 %] 96 % (03/04 0748)  Intake/Output from previous day: 03/03 0701 - 03/04 0700 In: 2119.1 [P.O.:720; I.V.:1199.1; IV Piggyback:200] Out: 2250 [Urine:2250] Intake/Output this shift: Total I/O In: 120 [P.O.:120] Out: -  UOP: 2.2L clear yellow  Physical Exam:  General: Alert and oriented CV: RRR Lungs: Clear Abdomen: Soft, ND, NT Ext: NT, No erythema  Lab Results: Recent Labs    07/02/22 0838 07/03/22 0424 07/04/22 0448  HGB 11.9* 10.5* 10.2*  HCT 34.9* 32.4* 32.0*   BMET Recent Labs    07/03/22 0424 07/04/22 0448  NA 137 136  K 4.0 3.9  CL 107 106  CO2 23 22  GLUCOSE 98 110*  BUN 17 14  CREATININE 1.05 0.98  CALCIUM 8.2* 8.0*     Studies/Results: No results found.  Assessment/Plan: Hematuria: resolved UTI Elevated PSA/prostate lesion on MRI BPH/retention  -Leave foley catheter to gravity drainage -Continue ancef -Urine cx >100K staph -Appreciate ID recs -Will arrange outpatient f/u for cysto and [place] evaluation -Will delay MRI fusion biopsy until he recovers from infection -He is undergoing outpatient evaluation with GI and gen surgery to work up his perirectal lymph nodes -Following peripherally. Please call with questions.   LOS: 1 day   Steven R.  MD 07/04/2022, 9:00 AM [facility]  Pager: [phone]

## 2022-07-05 ENCOUNTER — Inpatient Hospital Stay (HOSPITAL_COMMUNITY): Payer: Medicare Other

## 2022-07-05 ENCOUNTER — Inpatient Hospital Stay (HOSPITAL_COMMUNITY): Payer: Medicare Other | Admitting: Certified Registered Nurse Anesthetist

## 2022-07-05 ENCOUNTER — Encounter (HOSPITAL_COMMUNITY)
Admission: EM | Disposition: A | Discharge status: Home Health | Payer: Self-pay | Source: Home / Self Care | Attending: Family Medicine

## 2022-07-05 ENCOUNTER — Encounter (HOSPITAL_COMMUNITY): Payer: Self-pay | Admitting: Internal Medicine

## 2022-07-05 DIAGNOSIS — R0989 Other specified symptoms and signs involving the circulatory and respiratory systems: Secondary | ICD-10-CM

## 2022-07-05 DIAGNOSIS — D638 Anemia in other chronic diseases classified elsewhere: Secondary | ICD-10-CM

## 2022-07-05 DIAGNOSIS — N3001 Acute cystitis with hematuria: Secondary | ICD-10-CM

## 2022-07-05 DIAGNOSIS — R7881 Bacteremia: Secondary | ICD-10-CM | POA: Diagnosis not present

## 2022-07-05 DIAGNOSIS — I10 Essential (primary) hypertension: Secondary | ICD-10-CM

## 2022-07-05 DIAGNOSIS — I119 Hypertensive heart disease without heart failure: Secondary | ICD-10-CM | POA: Diagnosis not present

## 2022-07-05 DIAGNOSIS — E039 Hypothyroidism, unspecified: Secondary | ICD-10-CM

## 2022-07-05 DIAGNOSIS — R931 Abnormal findings on diagnostic imaging of heart and coronary circulation: Secondary | ICD-10-CM

## 2022-07-05 DIAGNOSIS — R310 Gross hematuria: Secondary | ICD-10-CM

## 2022-07-05 DIAGNOSIS — N400 Enlarged prostate without lower urinary tract symptoms: Secondary | ICD-10-CM | POA: Diagnosis not present

## 2022-07-05 DIAGNOSIS — B9561 Methicillin susceptible Staphylococcus aureus infection as the cause of diseases classified elsewhere: Secondary | ICD-10-CM | POA: Diagnosis not present

## 2022-07-05 HISTORY — PX: CYSTOSCOPY WITH FULGERATION: SHX6638

## 2022-07-05 LAB — CULTURE, BLOOD (ROUTINE X 2)
Special Requests: ADEQUATE
Special Requests: ADEQUATE

## 2022-07-05 LAB — BASIC METABOLIC PANEL
Anion gap: 7 (ref 5–15)
BUN: 14 mg/dL (ref 8–23)
CO2: 24 mmol/L (ref 22–32)
Calcium: 8.3 mg/dL — ABNORMAL LOW (ref 8.9–10.3)
Chloride: 105 mmol/L (ref 98–111)
Creatinine, Ser: 0.94 mg/dL (ref 0.61–1.24)
GFR, Estimated: >60 mL/min (ref >60)
Glucose, Bld: 112 mg/dL — ABNORMAL HIGH (ref 70–99)
Potassium: 4.2 mmol/L (ref 3.5–5.1)
Sodium: 136 mmol/L (ref 135–145)

## 2022-07-05 LAB — CBC
HCT: 25.5 % — ABNORMAL LOW (ref 39.0–52.0)
Hemoglobin: 8.2 g/dL — ABNORMAL LOW (ref 13.0–17.0)
MCH: 30.7 pg (ref 26.0–34.0)
MCHC: 32.2 g/dL (ref 30.0–36.0)
MCV: 95.5 fL (ref 80.0–100.0)
Platelets: 151 10*3/uL (ref 150–400)
RBC: 2.67 MIL/uL — ABNORMAL LOW (ref 4.22–5.81)
RDW: 14.3 % (ref 11.5–15.5)
WBC: 8.8 10*3/uL (ref 4.0–10.5)
nRBC: 0 % (ref 0.0–0.2)

## 2022-07-05 LAB — CBC WITH DIFFERENTIAL/PLATELET
Abs Immature Granulocytes: 0.22 10*3/uL — ABNORMAL HIGH (ref 0.00–0.07)
Abs Immature Granulocytes: 0.46 10*3/uL — ABNORMAL HIGH (ref 0.00–0.07)
Basophils Absolute: 0 10*3/uL (ref 0.0–0.1)
Basophils Absolute: 0 10*3/uL (ref 0.0–0.1)
Basophils Relative: 0 %
Basophils Relative: 0 %
Eosinophils Absolute: 0.2 10*3/uL (ref 0.0–0.5)
Eosinophils Absolute: 0 10*3/uL (ref 0.0–0.5)
Eosinophils Relative: 2 %
Eosinophils Relative: 0 %
HCT: 30.6 % — ABNORMAL LOW (ref 39.0–52.0)
HCT: 30.6 % — ABNORMAL LOW (ref 39.0–52.0)
Hemoglobin: 10 g/dL — ABNORMAL LOW (ref 13.0–17.0)
Hemoglobin: 9.7 g/dL — ABNORMAL LOW (ref 13.0–17.0)
Immature Granulocytes: 3 %
Immature Granulocytes: 5 %
Lymphocytes Relative: 23 %
Lymphocytes Relative: 8 %
Lymphs Abs: 1.6 10*3/uL (ref 0.7–4.0)
Lymphs Abs: 0.7 10*3/uL (ref 0.7–4.0)
MCH: 30.6 pg (ref 26.0–34.0)
MCH: 30.4 pg (ref 26.0–34.0)
MCHC: 32.7 g/dL (ref 30.0–36.0)
MCHC: 31.7 g/dL (ref 30.0–36.0)
MCV: 93.6 fL (ref 80.0–100.0)
MCV: 95.9 fL (ref 80.0–100.0)
Monocytes Absolute: 2 10*3/uL — ABNORMAL HIGH (ref 0.1–1.0)
Monocytes Absolute: 1.3 10*3/uL — ABNORMAL HIGH (ref 0.1–1.0)
Monocytes Relative: 29 %
Monocytes Relative: 15 %
Neutro Abs: 2.9 10*3/uL (ref 1.7–7.7)
Neutro Abs: 6.4 10*3/uL (ref 1.7–7.7)
Neutrophils Relative %: 43 %
Neutrophils Relative %: 72 %
Platelets: 141 10*3/uL — ABNORMAL LOW (ref 150–400)
Platelets: 148 10*3/uL — ABNORMAL LOW (ref 150–400)
RBC: 3.27 MIL/uL — ABNORMAL LOW (ref 4.22–5.81)
RBC: 3.19 MIL/uL — ABNORMAL LOW (ref 4.22–5.81)
RDW: 14.3 % (ref 11.5–15.5)
RDW: 14.3 % (ref 11.5–15.5)
WBC: 6.9 10*3/uL (ref 4.0–10.5)
WBC: 9 10*3/uL (ref 4.0–10.5)
nRBC: 0 % (ref 0.0–0.2)
nRBC: 0 % (ref 0.0–0.2)

## 2022-07-05 LAB — POCT I-STAT EG7
Acid-base deficit: 3 mmol/L — ABNORMAL HIGH (ref 0.0–2.0)
Bicarbonate: 22.9 mmol/L (ref 20.0–28.0)
Calcium, Ion: 1.21 mmol/L (ref 1.15–1.40)
HCT: 30 % — ABNORMAL LOW (ref 39.0–52.0)
Hemoglobin: 10.2 g/dL — ABNORMAL LOW (ref 13.0–17.0)
O2 Saturation: 27 %
Patient temperature: 97.5
Potassium: 4.7 mmol/L (ref 3.5–5.1)
Sodium: 135 mmol/L (ref 135–145)
TCO2: 24 mmol/L (ref 22–32)
pCO2, Ven: 41.5 mmHg — ABNORMAL LOW (ref 44–60)
pH, Ven: 7.346 (ref 7.25–7.43)
pO2, Ven: 19 mmHg — CL (ref 32–45)

## 2022-07-05 LAB — MAGNESIUM: Magnesium: 2 mg/dL (ref 1.7–2.4)

## 2022-07-05 LAB — ABO/RH: ABO/RH(D): A POS

## 2022-07-05 SURGERY — CYSTOSCOPY, WITH BLADDER FULGURATION
Anesthesia: General

## 2022-07-05 MED ORDER — SODIUM CHLORIDE 0.9 % IV SOLN
INTRAVENOUS | Status: DC | PRN
  Administered 2022-07-09: 10 mL/h via INTRAVENOUS

## 2022-07-05 MED ORDER — ALBUMIN HUMAN 5 % IV SOLN: INTRAVENOUS | Status: DC | PRN

## 2022-07-05 MED ORDER — SODIUM CHLORIDE 0.9% IV SOLUTION: Freq: Once | INTRAVENOUS | Status: DC

## 2022-07-05 MED ORDER — PROPOFOL 10 MG/ML IV BOLUS
INTRAVENOUS | Status: DC | PRN
  Administered 2022-07-05: 120 mg via INTRAVENOUS

## 2022-07-05 MED ORDER — FENTANYL CITRATE (PF) 100 MCG/2ML IJ SOLN
INTRAMUSCULAR | Status: DC | PRN
  Administered 2022-07-05: 50 ug via INTRAVENOUS

## 2022-07-05 MED ORDER — LIDOCAINE HCL (PF) 2 % IJ SOLN
INTRAMUSCULAR | Status: AC
  Filled 2022-07-05: qty 5

## 2022-07-05 MED ORDER — AMISULPRIDE (ANTIEMETIC) 5 MG/2ML IV SOLN: 10.0000 mg | Freq: Once | INTRAVENOUS | Status: DC | PRN

## 2022-07-05 MED ORDER — METOPROLOL SUCCINATE ER 25 MG PO TB24
25.0000 mg | ORAL_TABLET | Freq: Every day | ORAL | Status: DC
  Administered 2022-07-05: 25 mg via ORAL
  Filled 2022-07-05: qty 1

## 2022-07-05 MED ORDER — VASOPRESSIN 20 UNIT/ML IV SOLN
INTRAVENOUS | Status: DC | PRN
  Administered 2022-07-05: 1 [IU] via INTRAVENOUS
  Administered 2022-07-05: 2 [IU] via INTRAVENOUS
  Administered 2022-07-05: 1 [IU] via INTRAVENOUS

## 2022-07-05 MED ORDER — ONDANSETRON HCL 4 MG/2ML IJ SOLN
INTRAMUSCULAR | Status: DC | PRN
  Administered 2022-07-05: 4 mg via INTRAVENOUS

## 2022-07-05 MED ORDER — ONDANSETRON HCL 4 MG/2ML IJ SOLN
INTRAMUSCULAR | Status: AC
  Filled 2022-07-05: qty 2

## 2022-07-05 MED ORDER — SUCCINYLCHOLINE CHLORIDE 200 MG/10ML IV SOSY
PREFILLED_SYRINGE | INTRAVENOUS | Status: DC | PRN
  Administered 2022-07-05: 140 mg via INTRAVENOUS

## 2022-07-05 MED ORDER — LACTATED RINGERS IV SOLN: INTRAVENOUS | Status: DC

## 2022-07-05 MED ORDER — HYDROMORPHONE HCL 1 MG/ML IJ SOLN
0.5000 mg | INTRAMUSCULAR | Status: DC | PRN
  Administered 2022-07-05 – 2022-07-07 (×2): 0.5 mg via INTRAVENOUS
  Filled 2022-07-05 (×2): qty 0.5

## 2022-07-05 MED ORDER — FENTANYL CITRATE (PF) 100 MCG/2ML IJ SOLN
INTRAMUSCULAR | Status: AC
  Filled 2022-07-05: qty 2

## 2022-07-05 MED ORDER — SODIUM CHLORIDE 0.9 % IR SOLN
Status: DC | PRN
  Administered 2022-07-05: 9000 mL via INTRAVESICAL

## 2022-07-05 MED ORDER — VASOPRESSIN 20 UNIT/ML IV SOLN
INTRAVENOUS | Status: AC
  Filled 2022-07-05: qty 1

## 2022-07-05 MED ORDER — OXYBUTYNIN CHLORIDE 5 MG PO TABS
5.0000 mg | ORAL_TABLET | Freq: Three times a day (TID) | ORAL | Status: DC | PRN
  Administered 2022-07-05 – 2022-07-06 (×2): 5 mg via ORAL
  Filled 2022-07-05 (×2): qty 1

## 2022-07-05 MED ORDER — PHENYLEPHRINE HCL-NACL 20-0.9 MG/250ML-% IV SOLN: 0.0000 ug/min | INTRAVENOUS | Status: DC

## 2022-07-05 MED ORDER — PROPOFOL 10 MG/ML IV BOLUS
INTRAVENOUS | Status: AC
  Filled 2022-07-05: qty 20

## 2022-07-05 MED ORDER — SODIUM CHLORIDE 0.9 % IR SOLN
3000.0000 mL | Status: DC
  Administered 2022-07-05 – 2022-07-07 (×12): 3000 mL

## 2022-07-05 MED ORDER — SUCCINYLCHOLINE CHLORIDE 200 MG/10ML IV SOSY
PREFILLED_SYRINGE | INTRAVENOUS | Status: AC
  Filled 2022-07-05: qty 10

## 2022-07-05 MED ORDER — SACUBITRIL-VALSARTAN 24-26 MG PO TABS
1.0000 | ORAL_TABLET | Freq: Two times a day (BID) | ORAL | Status: DC
  Administered 2022-07-05 (×2): 1 via ORAL
  Filled 2022-07-05 (×2): qty 1

## 2022-07-05 MED ORDER — DEXAMETHASONE SODIUM PHOSPHATE 10 MG/ML IJ SOLN
INTRAMUSCULAR | Status: AC
  Filled 2022-07-05: qty 1

## 2022-07-05 MED ORDER — LIDOCAINE 2% (20 MG/ML) 5 ML SYRINGE
INTRAMUSCULAR | Status: DC | PRN
  Administered 2022-07-05: 80 mg via INTRAVENOUS

## 2022-07-05 MED ORDER — SODIUM CHLORIDE 0.9 % IV SOLN: INTRAVENOUS | Status: DC

## 2022-07-05 MED ORDER — ORAL CARE MOUTH RINSE: 15.0000 mL | OROMUCOSAL | Status: DC | PRN

## 2022-07-05 MED ORDER — ALBUMIN HUMAN 25 % IV SOLN
25.0000 g | Freq: Once | INTRAVENOUS | Status: AC
  Administered 2022-07-05: 25 g via INTRAVENOUS
  Filled 2022-07-05: qty 100

## 2022-07-05 MED ORDER — STERILE WATER FOR IRRIGATION IR SOLN
Status: DC | PRN
  Administered 2022-07-05: 3000 mL

## 2022-07-05 MED ORDER — HYDROMORPHONE HCL 1 MG/ML IJ SOLN
0.5000 mg | Freq: Once | INTRAMUSCULAR | Status: AC
  Administered 2022-07-05: 0.5 mg via INTRAVENOUS
  Filled 2022-07-05: qty 0.5

## 2022-07-05 MED ORDER — DEXAMETHASONE SODIUM PHOSPHATE 10 MG/ML IJ SOLN
INTRAMUSCULAR | Status: DC | PRN
  Administered 2022-07-05: 10 mg via INTRAVENOUS

## 2022-07-05 MED ORDER — PHENYLEPHRINE HCL-NACL 20-0.9 MG/250ML-% IV SOLN
INTRAVENOUS | Status: AC
  Administered 2022-07-05: 20 ug/min via INTRAVENOUS
  Filled 2022-07-05: qty 250

## 2022-07-05 MED ORDER — FENTANYL CITRATE PF 50 MCG/ML IJ SOSY: 25.0000 ug | PREFILLED_SYRINGE | INTRAMUSCULAR | Status: DC | PRN

## 2022-07-05 MED ORDER — PHENYLEPHRINE 80 MCG/ML (10ML) SYRINGE FOR IV PUSH (FOR BLOOD PRESSURE SUPPORT)
PREFILLED_SYRINGE | INTRAVENOUS | Status: DC | PRN
  Administered 2022-07-05: 160 ug via INTRAVENOUS
  Administered 2022-07-05: 240 ug via INTRAVENOUS
  Administered 2022-07-05: 160 ug via INTRAVENOUS
  Administered 2022-07-05: 80 ug via INTRAVENOUS
  Administered 2022-07-05 (×2): 160 ug via INTRAVENOUS

## 2022-07-05 MED ORDER — PHENYLEPHRINE 80 MCG/ML (10ML) SYRINGE FOR IV PUSH (FOR BLOOD PRESSURE SUPPORT)
PREFILLED_SYRINGE | INTRAVENOUS | Status: AC
  Filled 2022-07-05: qty 20

## 2022-07-05 SURGICAL SUPPLY — 12 items
BAG DRN RND TRDRP ANRFLXCHMBR (UROLOGICAL SUPPLIES) ×1
BAG URINE DRAIN 2000ML AR STRL (UROLOGICAL SUPPLIES) IMPLANT
CATH HEMA 3WAY 30CC 24FR COUDE (CATHETERS) IMPLANT
GLOVE BIO SURGEON STRL SZ8.5 (GLOVE) IMPLANT
GOWN STRL REUS W/TWL XL LVL3 (GOWN DISPOSABLE) IMPLANT
GUIDEWIRE STR DUAL SENSOR (WIRE) IMPLANT
LOOP CUT BIPOLAR 24F LRG (ELECTROSURGICAL) IMPLANT
NS IRRIG 1000ML POUR BTL (IV SOLUTION) IMPLANT
PACK CYSTO (CUSTOM PROCEDURE TRAY) IMPLANT
SYR TOOMEY IRRIG 70ML (MISCELLANEOUS) ×1
SYRINGE TOOMEY IRRIG 70ML (MISCELLANEOUS) IMPLANT
TUBING UROLOGY SET (TUBING) IMPLANT

## 2022-07-05 NOTE — Progress Notes (Signed)
[facility] for Infectious Disease    Date of Admission:  07/02/2022   Total days of antibiotics 4   ID: Steven Carter is a 77 y.o. male with  MSSA uti and bacteremia, hematuria Principal Problem:   UTI (urinary tract infection) Active Problems:   Gross hematuria   Tachycardia   MSSA bacteremia   Benign prostatic hyperplasia   Elevated PSA   Hypertension    Subjective: Having painful recurrent hematuria. He is getting set up for continuous bladder irrigation to remove clot.   Medications:   Chlorhexidine Gluconate Cloth  6 each Topical Daily   docusate sodium  100 mg Oral BID   finasteride  5 mg Oral BID   levothyroxine  112 mcg Oral Daily   metoprolol succinate  25 mg Oral Daily   oxybutynin  5 mg Oral Daily   sacubitril-valsartan  1 tablet Oral BID   tamsulosin  0.4 mg Oral BID    Objective: Vital signs in last 24 hours: Temp:  [98 F (36.7 C)-99 F (37.2 C)] 98 F (36.7 C) (03/05 1245) Pulse Rate:  [73-91] 88 (03/05 1245) Resp:  [18-20] 20 (03/05 1245) BP: (144-160)/(81-91) 160/91 (03/05 1245) SpO2:  [97 %-99 %] 99 % (03/05 1245)  Physical Exam  Constitutional: He is oriented to person, place, and time. He appears well-developed and well-nourished. No distress.  HENT:  Mouth/Throat: Oropharynx is clear and moist. No oropharyngeal exudate.  Cardiovascular: Normal rate, regular rhythm and normal heart sounds. Exam reveals no gallop and no friction rub.  No murmur heard.  Pulmonary/Chest: Effort normal and breath sounds normal. No respiratory distress. He has no wheezes.  Lymphadenopathy:  He has no cervical adenopathy.  Neurological: He is alert and oriented to person, place, and time.  Skin: Skin is warm and dry. No rash noted. No erythema.  Psychiatric: He has a normal mood and affect. His behavior is normal.    Lab Results Recent Labs    07/04/22 0448 07/05/22 0518  WBC 6.3 6.9  HGB 10.2* 10.0*  HCT 32.0* 30.6*  NA 136 136  K 3.9 4.2  CL  106 105  CO2 22 24  BUN 14 14  CREATININE 0.98 0.94    Microbiology: 3/4 blood cx NGTD 3/2 blood cx MSSA 3/2 urine cx MSSA Studies/Results: ECHOCARDIOGRAM COMPLETE  Result Date: 07/04/2022    ECHOCARDIOGRAM REPORT   Patient Name:   Steven Carter Date of Exam: 07/04/2022 Medical Rec #:  [id number]       Height:       73.0 in Accession #:    [id number]      Weight:       219.8 lb Date of Birth:  1945/06/28       BSA:          2.240 m Patient Age:    7 years        BP:           159/88 mmHg Patient Gender: M               HR:           73 bpm. Exam Location:  Inpatient Procedure: 2D Echo, Cardiac Doppler and Color Doppler                                MODIFIED REPORT:      This report was modified by Dorris Carnes MD  on 07/04/2022 due to Complete                                   impression.  Indications:     bacteremia  History:         Patient has no prior history of Echocardiogram examinations.  Sonographer:     Phineas Douglas Referring Phys:  [place] Diagnosing Phys: Dorris Carnes MD IMPRESSIONS  1. No obvious vegetation though cannot completely exclude REcomm TEE if clinically indicated.  2. Left ventricular ejection fraction, by estimation, is 35%. The left ventricle demonstrates global hypokinesis. There is mild concentric left ventricular hypertrophy.  3. Right ventricular systolic function is normal. The right ventricular size is normal.  4. Trivial mitral valve regurgitation.  5. The aortic valve is tricuspid. Aortic valve regurgitation is trivial. FINDINGS  Left Ventricle: Left ventricular ejection fraction, by estimation, is 35%. The left ventricle demonstrates global hypokinesis. The left ventricular internal cavity size was normal in size. There is mild concentric left ventricular hypertrophy. Right Ventricle: The right ventricular size is normal. Right vetricular wall thickness was not assessed. Right ventricular systolic function is normal. Left Atrium: Left atrial size was normal in  size. Right Atrium: Right atrial size was normal in size. Pericardium: There is no evidence of pericardial effusion. Mitral Valve: There is mild thickening of the mitral valve leaflet(s). Trivial mitral valve regurgitation. Tricuspid Valve: The tricuspid valve is normal in structure. Tricuspid valve regurgitation is trivial. Aortic Valve: The aortic valve is tricuspid. Aortic valve regurgitation is trivial. Aortic regurgitation PHT measures 495 msec. Pulmonic Valve: The pulmonic valve was not well visualized. Pulmonic valve regurgitation is not visualized. No evidence of pulmonic stenosis. Aorta: The aortic root and ascending aorta are structurally normal, with no evidence of dilitation. IAS/Shunts: No atrial level shunt detected by color flow Doppler.  LEFT VENTRICLE PLAX 2D LVIDd:         5.30 cm      Diastology LVIDs:         3.90 cm      LV e' medial:    5.77 cm/s LV PW:         1.20 cm      LV E/e' medial:  14.9 LV IVS:        1.30 cm      LV e' lateral:   9.90 cm/s LVOT diam:     2.00 cm      LV E/e' lateral: 8.7 LV SV:         66 LV SV Index:   29 LVOT Area:     3.14 cm  LV Volumes (MOD) LV vol d, MOD A2C: 139.0 ml LV vol d, MOD A4C: 166.0 ml LV vol s, MOD A2C: 75.0 ml LV vol s, MOD A4C: 90.6 ml LV SV MOD A2C:     64.0 ml LV SV MOD A4C:     166.0 ml LV SV MOD BP:      75.8 ml RIGHT VENTRICLE             IVC RV Basal diam:  4.00 cm     IVC diam: 1.70 cm RV S prime:     13.10 cm/s TAPSE (M-mode): 2.0 cm LEFT ATRIUM             Index        RIGHT ATRIUM  Index LA diam:        3.50 cm 1.56 cm/m   RA Area:     19.70 cm LA Vol (A2C):   68.4 ml 30.54 ml/m  RA Volume:   58.60 ml  26.16 ml/m LA Vol (A4C):   46.1 ml 20.58 ml/m LA Biplane Vol: 60.5 ml 27.01 ml/m  AORTIC VALVE LVOT Vmax:   101.00 cm/s LVOT Vmean:  71.500 cm/s LVOT VTI:    0.210 m AI PHT:      495 msec  AORTA Ao Root diam: 3.10 cm Ao Asc diam:  3.70 cm MITRAL VALVE MV Area (PHT): 4.39 cm    SHUNTS MV Decel Time: 173 msec    Systemic VTI:   0.21 m MV E velocity: 85.70 cm/s  Systemic Diam: 2.00 cm MV A velocity: 95.10 cm/s MV E/A ratio:  0.90 Dorris Carnes MD Electronically signed by Dorris Carnes MD Signature Date/Time: 07/04/2022/6:26:22 PM    Final (Updated)      Assessment/Plan: Complicated MSSA bacteremia with uti = continue with cefazolin 2gm IV q8 hr plus will need TEE to rule out endocarditis.   Hematuria = defer to dr gay for management. Currently continues on CBI.  [facility] Pager: [phone]  07/05/2022, 12:51 PM

## 2022-07-05 NOTE — Consult Note (Addendum)
Cardiology Consultation   Patient ID: Steven Carter MRN: [id number]; DOB: Jan 11, 1946  Admit date: 07/02/2022 Date of Consult: 07/05/2022  PCP:  [facility] Cardiologist:  Berniece Salines, DO   -- New    Patient Profile:   Steven Carter is a 77 y.o. male with a hx of BPH, hypothyroidism who is being seen 07/05/2022 for the evaluation of reduced EF at the request of Dr. Grandville Silos.  History of Present Illness:   Steven Carter is a 77 year old male with above medical history. Per chart review, patient does not follow with cardiology.  Patient recently had a Foley placed by urology in preparation for PET scan and prostate Bx in the coming weeks. He was doing well until 3/2 when he woke up with a clogged foley and blood in foley bag. Also had lower pelvic pain. Patient went to the [place] ED for evaluation. His foley was replaced, and 1100 cc of dark blood in urine was obtained. Of note, patient was tachycardic and hypertensive on arrival to the ED. His bladder was irrigated with sterile water, and gross hematuria resolved. However, patient met SIRS criteria with leukocytosis to 15, tachycardia to 120s. UA was concerning for UTI. He was admitted to the internal medicine service at [facility] for further management.   At [facility], patient was started on rocephin empirically. He was seen by urology on 3/3, who recommended outpatient evaluation for hematuria after acute hospitalization. Prostate biopsy was previously scheduled for 3/5, but was delayed due to acute infection. Urine cultures were obtained and grew >100,000 colonies of staph. Blood cultures were consistent with MSSA bacteremia. ID was consulted, patient was started on IV Ancef. Echocardiogram obtained on 3/4 showed no obvious vegetations, LVEF 35% with global hypokinesis, mild LVH, normal RV systolic function.   Recommended TEE for further evaluation and cardiology was consulted for reduced EF.   On  interview, patient denies having any past cardiac history.  He does not have a history of hypertension, hyperlipidemia, diabetes.  Reports that he is very healthy, and this is the first time he has been admitted to the hospital.  Denies family history of coronary artery disease.  He denies ever having chest pain, shortness of breath on exertion, orthopnea, ankle edema.  He does a workout video about 3 times a week and will walk his dogs, denies having chest pain or excessive shortness of breath with these activities.   Past Medical History:  Diagnosis Date   BPH (benign prostatic hyperplasia)    Hypothyroidism    Thyroid disease     History reviewed. No pertinent surgical history.   Home Medications:  Prior to Admission medications   Medication Sig Start Date End Date Taking? Authorizing Provider  cephALEXin (KEFLEX) 500 MG capsule Take 1 capsule (500 mg total) by mouth 4 (four) times daily. 07/02/22  Yes Regan Lemming, MD  finasteride (PROSCAR) 5 MG tablet Take 5 mg by mouth daily. 04/19/19  Yes [provider]  levofloxacin (LEVAQUIN) 750 MG tablet Take 750 mg by mouth daily. 07/01/22  Yes [provider]  levothyroxine (SYNTHROID) 112 MCG tablet Take 112 mcg by mouth daily. 01/17/19  Yes [provider]  oxybutynin (DITROPAN-XL) 5 MG 24 hr tablet Take 5 mg by mouth daily. 06/28/22  Yes [provider]  tamsulosin (FLOMAX) 0.4 MG CAPS capsule Take 0.8 mg by mouth daily.   Yes [provider]  gentamicin cream (GARAMYCIN) 0.1 % Apply 1  application topically 2 (two) times daily. Patient not taking: Reported on 07/02/2022 04/03/19   Edrick Kins, DPM  meloxicam (MOBIC) 15 MG tablet Take 15 mg by mouth daily as needed. Patient not taking: Reported on 07/02/2022 03/31/19   [provider]    Inpatient Medications: Scheduled Meds:  Chlorhexidine Gluconate Cloth  6 each Topical Daily   docusate sodium  100 mg Oral BID   finasteride  5 mg Oral  BID   levothyroxine  112 mcg Oral Daily   oxybutynin  5 mg Oral Daily   tamsulosin  0.4 mg Oral BID   Continuous Infusions:   ceFAZolin (ANCEF) IV 2 g (07/05/22 0805)   PRN Meds: acetaminophen **OR** acetaminophen, hydrALAZINE, ondansetron **OR** ondansetron (ZOFRAN) IV, polyethylene glycol  Allergies:   No Known Allergies  Social History:   Social History   Socioeconomic History   Marital status: Married    Spouse name: Not on file   Number of children: Not on file   Years of education: Not on file   Highest education level: Not on file  Occupational History   Not on file  Tobacco Use   Smoking status: Never   Smokeless tobacco: Never  Substance and Sexual Activity   Alcohol use: Yes    Alcohol/week: 7.0 standard drinks of alcohol    Types: 7 Glasses of wine per week   Drug use: Never   Sexual activity: Not on file  Other Topics Concern   Not on file  Social History Narrative   Not on file   Social Determinants of Health   Financial Resource Strain: Not on file  Food Insecurity: No Food Insecurity (07/04/2022)   Hunger Vital Sign    Worried About Running Out of Food in the Last Year: Never true    Ran Out of Food in the Last Year: Never true  Transportation Needs: No Transportation Needs (07/04/2022)   PRAPARE - Hydrologist (Medical): No    Lack of Transportation (Non-Medical): No  Physical Activity: Not on file  Stress: Not on file  Social Connections: Not on file  Intimate Partner Violence: Not At Risk (07/04/2022)   Humiliation, Afraid, Rape, and Kick questionnaire    Fear of Current or Ex-Partner: No    Emotionally Abused: No    Physically Abused: No    Sexually Abused: No    Family History:   History reviewed. No pertinent family history.   ROS:  Please see the history of present illness.  All other ROS reviewed and negative.     Physical Exam/Data:   Vitals:   07/04/22 0748 07/04/22 1303 07/04/22 2018 07/05/22 0454   BP: (!) 149/90 (!) 159/88 (!) 160/87 (!) 144/81  Pulse: 80 81 91 73  Resp: '18 18 18 19  '$ Temp: 98.7 F (37.1 C) 98.2 F (36.8 C) 99 F (37.2 C) 98.4 F (36.9 C)  TempSrc: Oral Oral Oral Oral  SpO2: 96% 98% 97% 97%  Weight:      Height:        Intake/Output Summary (Last 24 hours) at 07/05/2022 0917 Last data filed at 07/05/2022 0805 Gross per 24 hour  Intake 2291.85 ml  Output 6100 ml  Net -3808.15 ml      07/03/2022    5:19 AM  Last 3 Weights  Weight (lbs) 219 lb 12.8 oz  Weight (kg) 99.701 kg     Body mass index is 29 kg/m.  General:  Well nourished, well developed,  in no acute distress. Sitting upright in the bed  HEENT: normal Neck: no JVD Vascular: Radial pulses 2+ bilaterally Cardiac:  normal S1, S2; RRR; no murmur  Lungs:  clear to auscultation bilaterally, no wheezing, rhonchi or rales. Normal WOB on room air  Abd: soft, nontender, no hepatomegaly  Ext: no edema in BLE Musculoskeletal:  No deformities, BUE and BLE strength normal and equal Skin: warm and dry  Neuro:  CNs 2-12 intact, no focal abnormalities noted Psych:  Normal affect   EKG:  The EKG was personally reviewed and demonstrates:  NA Telemetry:  Telemetry was personally reviewed and demonstrates:  NA  Relevant CV Studies:  Echocardiogram 07/04/22   1. No obvious vegetation though cannot completely exclude REcomm TEE if  clinically indicated.   2. Left ventricular ejection fraction, by estimation, is 35%. The left  ventricle demonstrates global hypokinesis. There is mild concentric left  ventricular hypertrophy.   3. Right ventricular systolic function is normal. The right ventricular  size is normal.   4. Trivial mitral valve regurgitation.   5. The aortic valve is tricuspid. Aortic valve regurgitation is trivial.   Laboratory Data:  High Sensitivity Troponin:  No results for input(s): "TROPONINIHS" in the last 720 hours.   Chemistry Recent Labs  Lab 07/03/22 0424 07/04/22 0448  07/05/22 0518  NA 137 136 136  K 4.0 3.9 4.2  CL 107 106 105  CO2 '23 22 24  '$ GLUCOSE 98 110* 112*  BUN '17 14 14  '$ CREATININE 1.05 0.98 0.94  CALCIUM 8.2* 8.0* 8.3*  MG  --  1.9 2.0  GFRNONAA >60 >60 >60  ANIONGAP '7 8 7    '$ No results for input(s): "PROT", "ALBUMIN", "AST", "ALT", "ALKPHOS", "BILITOT" in the last 168 hours. Lipids No results for input(s): "CHOL", "TRIG", "HDL", "LABVLDL", "LDLCALC", "CHOLHDL" in the last 168 hours.  Hematology Recent Labs  Lab 07/03/22 0424 07/04/22 0448 07/05/22 0518  WBC 13.5* 6.3 6.9  RBC 3.40* 3.30* 3.27*  HGB 10.5* 10.2* 10.0*  HCT 32.4* 32.0* 30.6*  MCV 95.3 97.0 93.6  MCH 30.9 30.9 30.6  MCHC 32.4 31.9 32.7  RDW 14.4 14.4 14.3  PLT 140* 129* 141*   Thyroid No results for input(s): "TSH", "FREET4" in the last 168 hours.  BNPNo results for input(s): "BNP", "PROBNP" in the last 168 hours.  DDimer No results for input(s): "DDIMER" in the last 168 hours.   Radiology/Studies:  ECHOCARDIOGRAM COMPLETE  Result Date: 07/04/2022    ECHOCARDIOGRAM REPORT   Patient Name:   Steven Carter Date of Exam: 07/04/2022 Medical Rec #:  [id number]       Height:       73.0 in Accession #:    [id number]      Weight:       219.8 lb Date of Birth:  1946-04-23       BSA:          2.240 m Patient Age:    19 years        BP:           159/88 mmHg Patient Gender: M               HR:           73 bpm. Exam Location:  Inpatient Procedure: 2D Echo, Cardiac Doppler and Color Doppler  MODIFIED REPORT:      This report was modified by Dorris Carnes MD on 07/04/2022 due to Complete                                   impression.  Indications:     bacteremia  History:         Patient has no prior history of Echocardiogram examinations.  Sonographer:     Phineas Douglas Referring Phys:  [place] Diagnosing Phys: Dorris Carnes MD IMPRESSIONS  1. No obvious vegetation though cannot completely exclude REcomm TEE if clinically indicated.  2. Left  ventricular ejection fraction, by estimation, is 35%. The left ventricle demonstrates global hypokinesis. There is mild concentric left ventricular hypertrophy.  3. Right ventricular systolic function is normal. The right ventricular size is normal.  4. Trivial mitral valve regurgitation.  5. The aortic valve is tricuspid. Aortic valve regurgitation is trivial. FINDINGS  Left Ventricle: Left ventricular ejection fraction, by estimation, is 35%. The left ventricle demonstrates global hypokinesis. The left ventricular internal cavity size was normal in size. There is mild concentric left ventricular hypertrophy. Right Ventricle: The right ventricular size is normal. Right vetricular wall thickness was not assessed. Right ventricular systolic function is normal. Left Atrium: Left atrial size was normal in size. Right Atrium: Right atrial size was normal in size. Pericardium: There is no evidence of pericardial effusion. Mitral Valve: There is mild thickening of the mitral valve leaflet(s). Trivial mitral valve regurgitation. Tricuspid Valve: The tricuspid valve is normal in structure. Tricuspid valve regurgitation is trivial. Aortic Valve: The aortic valve is tricuspid. Aortic valve regurgitation is trivial. Aortic regurgitation PHT measures 495 msec. Pulmonic Valve: The pulmonic valve was not well visualized. Pulmonic valve regurgitation is not visualized. No evidence of pulmonic stenosis. Aorta: The aortic root and ascending aorta are structurally normal, with no evidence of dilitation. IAS/Shunts: No atrial level shunt detected by color flow Doppler.  LEFT VENTRICLE PLAX 2D LVIDd:         5.30 cm      Diastology LVIDs:         3.90 cm      LV e' medial:    5.77 cm/s LV PW:         1.20 cm      LV E/e' medial:  14.9 LV IVS:        1.30 cm      LV e' lateral:   9.90 cm/s LVOT diam:     2.00 cm      LV E/e' lateral: 8.7 LV SV:         66 LV SV Index:   29 LVOT Area:     3.14 cm  LV Volumes (MOD) LV vol d, MOD A2C:  139.0 ml LV vol d, MOD A4C: 166.0 ml LV vol s, MOD A2C: 75.0 ml LV vol s, MOD A4C: 90.6 ml LV SV MOD A2C:     64.0 ml LV SV MOD A4C:     166.0 ml LV SV MOD BP:      75.8 ml RIGHT VENTRICLE             IVC RV Basal diam:  4.00 cm     IVC diam: 1.70 cm RV S prime:     13.10 cm/s TAPSE (M-mode): 2.0 cm LEFT ATRIUM             Index  RIGHT ATRIUM           Index LA diam:        3.50 cm 1.56 cm/m   RA Area:     19.70 cm LA Vol (A2C):   68.4 ml 30.54 ml/m  RA Volume:   58.60 ml  26.16 ml/m LA Vol (A4C):   46.1 ml 20.58 ml/m LA Biplane Vol: 60.5 ml 27.01 ml/m  AORTIC VALVE LVOT Vmax:   101.00 cm/s LVOT Vmean:  71.500 cm/s LVOT VTI:    0.210 m AI PHT:      495 msec  AORTA Ao Root diam: 3.10 cm Ao Asc diam:  3.70 cm MITRAL VALVE MV Area (PHT): 4.39 cm    SHUNTS MV Decel Time: 173 msec    Systemic VTI:  0.21 m MV E velocity: 85.70 cm/s  Systemic Diam: 2.00 cm MV A velocity: 95.10 cm/s MV E/A ratio:  0.90 Dorris Carnes MD Electronically signed by Dorris Carnes MD Signature Date/Time: 07/04/2022/6:26:22 PM    Final (Updated)      Assessment and Plan:   Acute Systolic Heart Failure  - Patient presented on 3/2 complaining of clogged foley, gross hematuria, pelvic pain. Found to have staph UTI and MSSA bacteremia. Now on IV antibiotics.  - Echocardiogram on 3/4 showed EF 35%, no obvious vegetation  -Patient denies history of hypertension, hyperlipidemia, diabetes, family history of coronary artery disease.  He smoked a little bit in his 46s, but has not smoked cigarettes since. Ordered A1c and lipid panel for tomorrow AM to assess risk factors  - Patient denies chest pain, shortness of breath.  He does a workout video 3 days a week, walks his dogs without chest pain or shortness of breath - EF is reduced in the setting of sepsis. Given limited risk factors for coronary artery disease and lack of symptoms, will start GDMT and reassess EF in 3 months. Can consider ischemic evaluation at that time - Would not pursue  ischemic evaluation at this time as patient has gross hematuria and is pending prostate biopsy  - BP elevated this admission. Start entresto 24-26 mg BID  - Start metoprolol succinate 25 mg daily  - If BP and renal function tolerates above medications, can start spiro prior to DC  - Hold off on SGLT2i given UTI    MSSA Bactermia  - ID recommended TEE- scheduled for 3/7  Shared Decision Making/Informed Consent The risks [esophageal damage, perforation (1:10,000 risk), bleeding, pharyngeal hematoma as well as other potential complications associated with conscious sedation including aspiration, arrhythmia, respiratory failure and death], benefits (treatment guidance and diagnostic support) and alternatives of a transesophageal echocardiogram were discussed in detail with Mr. Towers and he is willing to proceed.  - TEE orders are in   Risk Assessment/Risk Scores:  New York Heart Association (NYHA) Functional Class NYHA Class I For questions or updates, please contact [place] Please consult [email or web address] for contact info under  Signed, Margie Billet, PA-C  07/05/2022 9:17 AM  Patient seen and examined, note reviewed with the signed Advanced Practice Provider. I personally reviewed laboratory data, imaging studies and relevant notes. I independently examined the patient and formulated the important aspects of the plan. I have personally discussed the plan with the patient and/or family. Comments or changes to the note/plan are indicated below.  Patient seen examined at his bedside.  He offers no complaints of chest pain or shortness of breath at this time.  The biggest issue for the patient  right now is the fact that he is experiencing hematuria.  Bleeding appears EFR all new diagnosis.  He is also bacteremic.  Clinically he does not appear to be in heart failure.  Will continue to monitor this. In terms of his depressed ejection fraction there could be multiple reasons however  at this time medical management will be for step with starting the patient going directly medical therapy.  Will start Entresto 24 to 26 mg twice daily, start Toprol-XL 25 mg. No need for furthering ischemic evaluation as the patient does not have any anginal symptoms.  Surely once he has had his urological acuity suggest we can reconsider.  In terms of his bacteremia noted MSSA he has a TEE that is scheduled.  Spoke with patient about this he is in agreement with that all of his questions has been answered.   Will continue to follow with you.  Berniece Salines DO, MS [facility] Attending Cardiologist [place]  [address] [place], [place] [address] [phone] Website: [email or web address]

## 2022-07-05 NOTE — Progress Notes (Signed)
Foley replaced  now on CBI, fast flowing still needing intermittent hand irrigation with lots of clots noted, output pinkish. Patient/wife updated with plans of care.

## 2022-07-05 NOTE — Anesthesia Postprocedure Evaluation (Signed)
Anesthesia Post Note  Patient: [name]  Procedure(s) Performed: [place], CLOT EVACUATION     Patient location during evaluation: PACU Anesthesia Type: General Level of consciousness: awake and alert Pain management: pain level controlled Vital Signs Assessment: post-procedure vital signs reviewed and stable Respiratory status: spontaneous breathing, nonlabored ventilation, respiratory function stable and patient connected to nasal cannula oxygen Cardiovascular status: blood pressure returned to baseline Postop Assessment: no apparent nausea or vomiting Anesthetic complications: no  No notable events documented.  Last Vitals:  Vitals:   07/05/22 2034 07/05/22 2040  BP: 99/64 (!) 98/59  Pulse: 87   Resp: 17   Temp:    SpO2: 94% 95%    Last Pain:  Vitals:   07/05/22 2030  TempSrc:   PainSc: 0-No pain                 Tiajuana Amass

## 2022-07-05 NOTE — Op Note (Signed)
PATIENT:  Steven Carter  PRE-OPERATIVE DIAGNOSIS: Gross Hematuria  POST-OPERATIVE DIAGNOSIS: Same  PROCEDURE:  Procedure(s): 1. Cystourethroscopy, Clot Evacuation with Fulguration  SURGEON:  Surgeon(s): Dr. Heather Roberts, MD - Surgeon Dr. Josph Macho, MD - Resident Assisting  ANESTHESIA:   General  EBL:  Minimal  DRAINS: Urethral catheter (24 Fr. 3-way Foley)   SPECIMEN:  None  Indication:  77 year old male with recent UTI and bacteremia who developed significant gross hematuria. Today, he developed clot retention, clot unable to be evacuated at bedside via foley catheter. He presents today for cystourethroscopy, clot evacuation, fulguration.  Description of operation: The patient was taken to the operating room and administered general anesthesia. They were then placed on the table and moved to the dorsal lithotomy position after which the genitalia was sterilely prepped and draped. An official timeout was then performed.  The 38 French resectoscope with the 30 lens and visual obturator were then passed into the bladder under direct visualization. Anterior urethra appeared normal. The prostatic urethra notable for multiple areas of trauma. There was trilobar prostatic hyperplasia and significant high riding bladder neck. Initial visualization obscured due to hematuria and significant clot burden. Clot was evacuated via the resectoscope using a toomey syringe without difficulty. Once all clot was evacuated, the lens was advanced into the resectoscope and thorough inspection was performed. Small area of left bladder neck and left lateral lobe of prostate noted to be oozing blood so these areas were fulgurated using bipolar electrocautery with good effect. Once satisfactory hemostasis was achieved further cystoscopy was performed. There was diffuse areas of erythema likely secondary to patient's history of cystitis. Bilateral ureteral orifices were orthotopic. Left ureteral orifice  had clear efflux. The right ureteral orifice notable for bloody efflux of urine.   The decision was made to leave a hematuria catheter in place. A sensor wire was advanced into the bladder via the resectoscope and the resectoscope was removed leaving the sensor wire in place. Over the sensor wire, the 24Fr 3-way hematuria catheter was advanced into the bladder without issue. 30cc of sterile water was placed into the balloon. The catheter was attached to CBI titrated to light pink.  PLAN OF CARE:  - Continue Foley catheter to gravity drainage with CBI titrated to light pink - Follow up CTU results for work up of gross hematuria.  PATIENT DISPOSITION:  PACU.

## 2022-07-05 NOTE — Progress Notes (Signed)
Noted patient's foley not draining, hand irrigated foley, getting bright red blood out with lots of clots out. Then unable to get any output with the hand irrigation, started C/O sever pain. Dr. Abner Greenspan notified, requested urology cath and on the way to see patient. Dr. Grandville Silos aware ordered dilaudid ordered and given, will continue to monitor patient.

## 2022-07-05 NOTE — Progress Notes (Signed)
PROGRESS NOTE    Steven Carter  [id number] DOB: 10/09/1945 DOA: 07/02/2022 PCP: [facility]    Chief Complaint  Patient presents with   Foley Catheter Problem    Brief Narrative:  Patient 77 year old gentleman history of BPH, hypothyroidism admitted with gross hematuria.  Noted to have had Foley catheter placed by urology at the end of February in preparation for PET scan and prostate biopsy this coming week.  Patient doing well but woke up on the morning of admission with clogged Foley and blood in Foley bag as well as lower pelvic pain.  Foley was replaced at the [facility] ED patient was feeling much better however due to initial tachycardia and hypertension ED felt uncomfortable and patient admitted for [facility] for further evaluation.  Workup done included blood cultures which came back positive for MSSA bacteremia.  Patient placed on IV antibiotics.  Urine cultures ordered and pending.  Urology and ID consulted for further evaluation and management.   Assessment & Plan:   Principal Problem:   UTI (urinary tract infection) Active Problems:   MSSA bacteremia   Gross hematuria   Tachycardia   Benign prostatic hyperplasia   Elevated PSA   Hypertension   Abnormal echocardiogram   #1 UTI secondary to MSSA -Patient presented with hematuria, noted to be tachycardic and hypertensive. -Foley catheter replaced in the ED and [facility]. -Urinalysis consistent with UTI. -Urine cultures with > 100,000 colonies of MSSA. -Blood cultures done consistent with MSSA bacteremia. -Patient noted to have some blood around urethral catheter/urethral meatus and as such urology consulted and following. -Urine around meatus resolved. -Continue empiric IV antibiotics of Ancef, IV fluids, supportive care.  2.  MSSA bacteremia -Likely seeded from urine. -Likely etiology of tachycardia and hypertension. -ID consulted and feels MSSA bacteremia may have been secondary to  recent urologic intervention and probable UTI. -Patient on IV antibiotics of Ancef. -ID recommending 2D echo and TEE.   -TEE scheduled for Thursday, 07/07/2022.  May possibly need a TEE. -2D echo done with no obvious vegetation however cannot completely exclude an assigned recommending TEE, EF 35%, global hypokinesis, mild concentric LVH.  Trivial MVR.  Trivial AVR. -Repeat blood cultures ordered and pending. -Continue IV cefazolin 2 g every 8 hours. -ID following and appreciate input and recommendations.  3.  Hypertension/tachycardia -Likely secondary to problems #1 and 2. -Improved.  4.  Gross hematuria -??  Etiology -Hematuria initially improved however patient noted to have hematuria overnight and worsening hematuria this morning with bladder spasms and significant lower abdominal pain. -Patient seen by primary urologist again this morning, who suspect etiology likely prostatic, possible trauma with Foley balloon into the prostate. -Urology reassessing the patient and to place a 24 three-way catheter hubbed at the penis. -Continue CBI. -Oxybutynin as needed bladder spasms. -IV Dilaudid 0.5 mg every 4 hours as needed severe pain. -Urology following.    5.  Elevated PSA/abnormal prostate MRI -Patient assessed by urology noted prostate biopsy scheduled for 07/05/2022 will need to be delayed due to his acute infection. -Per urology.  6.  BPH/urinary retention -Foley catheter in place, urology assessed patient recommending continuation of Foley catheter. -Patient states finasteride and tamsulosin dose recently increased to twice daily and as such patient placed on twice daily dosing.   -Patient noted to have some hematuria overnight as well as Foley catheter being clotted which was irrigated. -Patient with ongoing hematuria and Foley catheter clotted again, urology following urology to reassess for further management. -Urology following.  7.  Abnormal 2D echo/depressed  EF/cardiomyopathy -Patient noted with abnormal 2D echo done for evaluation of endocarditis with no obvious vegetation however cannot completely exclude an assigned recommending TEE, EF 35%, global hypokinesis, mild concentric LVH.  Trivial MVR.  Trivial AVR. -Due to abnormal 2D echo with depressed EF and concern for cardiomyopathy cardiology consulted. -Patient for TEE tomorrow. -Per cardiology no need for ischemic evaluation at this time. -Cardiac medications adjusted and changed per cardiology.   DVT prophylaxis: SCDs Code Status: Full Family Communication: Updated patient.  No family at bedside.   Disposition: Likely home when clinically improved and cleared by ID.  Status is: Inpatient The patient will require care spanning > 2 midnights and should be moved to inpatient because: Severity of illness   Consultants:  Urology: Dr. Alinda Money 07/03/2022 ID: Dr.Comer 07/03/2022 Cardiology: Dr. Harriet Masson 07/05/2022  Procedures:  2D echo 07/04/2022  Antimicrobials:  IV Ancef 07/03/2022>>> IV Rocephin 07/02/2022 x 1 dose   Subjective: Patient laying in bed, in excruciating pain in the pelvic region and also complaining of bladder spasms.  Hematuria noted in Foley catheter.  Per nursing patient with ongoing hematuria and concern for Foley catheter being clotted off.  Similar scenario occurred overnight and Foley catheter was exchanged and irrigated.  RN awaiting on urology arrival to reassess.  Denies any chest pain.  No shortness of breath.  Some complaints of lower abdominal pain.    Objective: Vitals:   07/04/22 1303 07/04/22 2018 07/05/22 0454 07/05/22 1245  BP: (!) 159/88 (!) 160/87 (!) 144/81 (!) 160/91  Pulse: 81 91 73 88  Resp: '18 18 19 20  '$ Temp: 98.2 F (36.8 C) 99 F (37.2 C) 98.4 F (36.9 C) 98 F (36.7 C)  TempSrc: Oral Oral Oral Oral  SpO2: 98% 97% 97% 99%  Weight:      Height:        Intake/Output Summary (Last 24 hours) at 07/05/2022 1731 Last data filed at 07/05/2022 1628 Gross  per 24 hour  Intake 2051.85 ml  Output 14450 ml  Net -12398.15 ml   Filed Weights   07/03/22 0519  Weight: 99.7 kg    Examination:  General exam: NAD. Respiratory system: CTAB anterior lung fields.  No wheezes, no crackles, no rhonchi.  Fair air movement.  Speaking in full sentences.  Cardiovascular system: Tachycardia.  No murmurs rubs or gallops.  No JVD.  No lower extremity edema.  Gastrointestinal system: Abdomen is soft, nondistended, significant tenderness to palpation in the suprapubic region.  Positive bowel sounds.  No rebound.  No guarding.  Genitourinary: Indwelling Foley catheter in place with hematuria.  Blood noted around meatus. Central nervous system: Alert and oriented. No focal neurological deficits. Extremities: Symmetric 5 x 5 power. Skin: No rashes, lesions or ulcers Psychiatry: Judgement and insight appear normal. Mood & affect appropriate.     Data Reviewed: I have personally reviewed following labs and imaging studies  CBC: Recent Labs  Lab 07/02/22 0838 07/03/22 0424 07/04/22 0448 07/05/22 0518  WBC 15.0* 13.5* 6.3 6.9  NEUTROABS 11.9*  --  3.7 2.9  HGB 11.9* 10.5* 10.2* 10.0*  HCT 34.9* 32.4* 32.0* 30.6*  MCV 90.6 95.3 97.0 93.6  PLT 147* 140* 129* 141*    Basic Metabolic Panel: Recent Labs  Lab 07/02/22 0838 07/03/22 0424 07/04/22 0448 07/05/22 0518  NA 134* 137 136 136  K 3.8 4.0 3.9 4.2  CL 100 107 106 105  CO2 20* '23 22 24  '$ GLUCOSE 142* 98 110* 112*  BUN '21 17 14 14  '$ CREATININE 1.18 1.05 0.98 0.94  CALCIUM 9.2 8.2* 8.0* 8.3*  MG  --   --  1.9 2.0    GFR: Estimated Creatinine Clearance: 84.3 mL/min (by C-G formula based on SCr of 0.94 mg/dL).  Liver Function Tests: No results for input(s): "AST", "ALT", "ALKPHOS", "BILITOT", "PROT", "ALBUMIN" in the last 168 hours.  CBG: No results for input(s): "GLUCAP" in the last 168 hours.   Recent Results (from the past 240 hour(s))  Urine Culture     Status: Abnormal    Collection Time: 07/02/22  9:42 AM   Specimen: Urine, Random  Result Value Ref Range Status   Specimen Description   Final    URINE, RANDOM Performed at [facility], [address] [place], [place] [address]    Special Requests   Final    NONE Reflexed from [id number] Performed at [facility], [address], [place], [place] [address]    Culture >=100,000 COLONIES/mL STAPHYLOCOCCUS AUREUS (A)  Final   Report Status 07/04/2022 FINAL  Final   Organism ID, Bacteria STAPHYLOCOCCUS AUREUS (A)  Final      Susceptibility   Staphylococcus aureus - MIC*    CIPROFLOXACIN <=0.5 SENSITIVE Sensitive     GENTAMICIN <=0.5 SENSITIVE Sensitive     NITROFURANTOIN <=16 SENSITIVE Sensitive     OXACILLIN 0.5 SENSITIVE Sensitive     TETRACYCLINE <=1 SENSITIVE Sensitive     VANCOMYCIN <=0.5 SENSITIVE Sensitive     TRIMETH/SULFA <=10 SENSITIVE Sensitive     CLINDAMYCIN <=0.25 SENSITIVE Sensitive     RIFAMPIN <=0.5 SENSITIVE Sensitive     Inducible Clindamycin NEGATIVE Sensitive     * >=100,000 COLONIES/mL STAPHYLOCOCCUS AUREUS  Blood culture (routine x 2)     Status: Abnormal   Collection Time: 07/02/22 11:35 AM   Specimen: BLOOD  Result Value Ref Range Status   Specimen Description   Final    BLOOD RIGHT ANTECUBITAL Performed at [facility], [address], [place], [place] [address]    Special Requests   Final    BOTTLES DRAWN AEROBIC AND ANAEROBIC Blood Culture adequate volume Performed at [facility], [address], [place], [place] [address]    Culture  Setup Time   Final    GRAM POSITIVE COCCI IN CLUSTERS IN BOTH AEROBIC AND ANAEROBIC BOTTLES Organism ID to follow CRITICAL RESULT CALLED TO, READ BACK BY AND VERIFIED WITH: M LILLISTON,PHARMD'@0555'$  07/03/22 [place] Performed at [facility], [place] [address] [place],  [address]    Culture STAPHYLOCOCCUS AUREUS (A)  Final   Report Status 07/05/2022  FINAL  Final   Organism ID, Bacteria STAPHYLOCOCCUS AUREUS  Final      Susceptibility   Staphylococcus aureus - MIC*    CIPROFLOXACIN <=0.5 SENSITIVE Sensitive     ERYTHROMYCIN <=0.25 SENSITIVE Sensitive     GENTAMICIN <=0.5 SENSITIVE Sensitive     OXACILLIN 0.5 SENSITIVE Sensitive     TETRACYCLINE <=1 SENSITIVE Sensitive     VANCOMYCIN 1 SENSITIVE Sensitive     TRIMETH/SULFA <=10 SENSITIVE Sensitive     CLINDAMYCIN <=0.25 SENSITIVE Sensitive     RIFAMPIN <=0.5 SENSITIVE Sensitive     Inducible Clindamycin NEGATIVE Sensitive     * STAPHYLOCOCCUS AUREUS  Blood Culture ID Panel (Reflexed)     Status: Abnormal   Collection Time: 07/02/22 11:35 AM  Result Value Ref Range Status   Enterococcus faecalis NOT DETECTED NOT DETECTED Final   Enterococcus Faecium NOT DETECTED  NOT DETECTED Final   Listeria monocytogenes NOT DETECTED NOT DETECTED Final   Staphylococcus species DETECTED (A) NOT DETECTED Final    Comment: CRITICAL RESULT CALLED TO, READ BACK BY AND VERIFIED WITH: M LILLISTON,PHARMD'@0555'$  07/03/22 [place]    Staphylococcus aureus (BCID) DETECTED (A) NOT DETECTED Final    Comment: CRITICAL RESULT CALLED TO, READ BACK BY AND VERIFIED WITH: M LILLISTON,PHARMD'@0555'$  07/03/22 [place]    Staphylococcus epidermidis NOT DETECTED NOT DETECTED Final   Staphylococcus lugdunensis NOT DETECTED NOT DETECTED Final   Streptococcus species NOT DETECTED NOT DETECTED Final   Streptococcus agalactiae NOT DETECTED NOT DETECTED Final   Streptococcus pneumoniae NOT DETECTED NOT DETECTED Final   Streptococcus pyogenes NOT DETECTED NOT DETECTED Final   A.calcoaceticus-baumannii NOT DETECTED NOT DETECTED Final   Bacteroides fragilis NOT DETECTED NOT DETECTED Final   Enterobacterales NOT DETECTED NOT DETECTED Final   Enterobacter cloacae complex NOT DETECTED NOT DETECTED Final   Escherichia coli NOT DETECTED NOT DETECTED Final   Klebsiella aerogenes NOT DETECTED NOT DETECTED Final   Klebsiella oxytoca NOT DETECTED  NOT DETECTED Final   Klebsiella pneumoniae NOT DETECTED NOT DETECTED Final   Proteus species NOT DETECTED NOT DETECTED Final   Salmonella species NOT DETECTED NOT DETECTED Final   Serratia marcescens NOT DETECTED NOT DETECTED Final   Haemophilus influenzae NOT DETECTED NOT DETECTED Final   Neisseria meningitidis NOT DETECTED NOT DETECTED Final   Pseudomonas aeruginosa NOT DETECTED NOT DETECTED Final   Stenotrophomonas maltophilia NOT DETECTED NOT DETECTED Final   Candida albicans NOT DETECTED NOT DETECTED Final   Candida auris NOT DETECTED NOT DETECTED Final   Candida glabrata NOT DETECTED NOT DETECTED Final   Candida krusei NOT DETECTED NOT DETECTED Final   Candida parapsilosis NOT DETECTED NOT DETECTED Final   Candida tropicalis NOT DETECTED NOT DETECTED Final   Cryptococcus neoformans/gattii NOT DETECTED NOT DETECTED Final   Meth resistant mecA/C and MREJ NOT DETECTED NOT DETECTED Final    Comment: Performed at [facility], [address] [place], [place] [address]  Blood culture (routine x 2)     Status: Abnormal   Collection Time: 07/02/22 11:42 AM   Specimen: BLOOD RIGHT HAND  Result Value Ref Range Status   Specimen Description   Final    BLOOD RIGHT HAND Performed at [facility], [address], [place], [place] [address]    Special Requests   Final    BOTTLES DRAWN AEROBIC AND ANAEROBIC Blood Culture adequate volume Performed at [facility], [address], [place], [place] [address]    Culture  Setup Time   Final    GRAM POSITIVE COCCI IN CLUSTERS IN BOTH AEROBIC AND ANAEROBIC BOTTLES CRITICAL VALUE NOTED.  VALUE IS CONSISTENT WITH PREVIOUSLY REPORTED AND CALLED VALUE.    Culture (A)  Final    STAPHYLOCOCCUS AUREUS SUSCEPTIBILITIES PERFORMED ON PREVIOUS CULTURE WITHIN THE LAST 5 DAYS. Performed at [facility], [place] [address], [place], [place] [address]    Report Status 07/05/2022 FINAL  Final  Culture, blood  (Routine X 2) w Reflex to ID Panel     Status: None (Preliminary result)   Collection Time: 07/04/22  4:48 AM   Specimen: BLOOD  Result Value Ref Range Status   Specimen Description   Final    BLOOD BLOOD LEFT ARM Performed at [facility] [address], [place], [place] [address]    Special Requests   Final    BOTTLES DRAWN AEROBIC ONLY Blood  Culture adequate volume Performed at [facility] [address] [place], [place] [address]    Culture   Final    NO GROWTH 1 DAY Performed at [facility], [place] [address], [place], [place] [address]    Report Status PENDING  Incomplete  Culture, blood (Routine X 2) w Reflex to ID Panel     Status: None (Preliminary result)   Collection Time: 07/04/22  4:48 AM   Specimen: BLOOD  Result Value Ref Range Status   Specimen Description   Final    BLOOD BLOOD RIGHT ARM Performed at [facility] [address], [place], [place] [address]    Special Requests   Final    BOTTLES DRAWN AEROBIC ONLY Blood Culture adequate volume Performed at [facility] [address] [place], [place] [address]    Culture   Final    NO GROWTH 1 DAY Performed at [facility], [place] [address], [place], [place] [address]    Report Status PENDING  Incomplete         Radiology Studies: ECHOCARDIOGRAM COMPLETE  Result Date: 07/04/2022    ECHOCARDIOGRAM REPORT   Patient Name:   Steven Carter Date of Exam: 07/04/2022 Medical Rec #:  [id number]       Height:       73.0 in Accession #:    [id number]      Weight:       219.8 lb Date of Birth:  Jan 21, 1946       BSA:          2.240 m Patient Age:    60 years        BP:           159/88 mmHg Patient Gender: M               HR:           73 bpm. Exam Location:  Inpatient Procedure: 2D Echo, Cardiac Doppler and Color Doppler                                MODIFIED REPORT:      This report was modified by Dorris Carnes MD on 07/04/2022 due to  Complete                                   impression.  Indications:     bacteremia  History:         Patient has no prior history of Echocardiogram examinations.  Sonographer:     Phineas Douglas Referring Phys:  [place] Diagnosing Phys: Dorris Carnes MD IMPRESSIONS  1. No obvious vegetation though cannot completely exclude REcomm TEE if clinically indicated.  2. Left ventricular ejection fraction, by estimation, is 35%. The left ventricle demonstrates global hypokinesis. There is mild concentric left ventricular hypertrophy.  3. Right ventricular systolic function is normal. The right ventricular size is normal.  4. Trivial mitral valve regurgitation.  5. The aortic valve is tricuspid. Aortic valve regurgitation is trivial. FINDINGS  Left Ventricle: Left ventricular ejection fraction, by estimation, is 35%. The left ventricle demonstrates global hypokinesis. The left ventricular internal cavity size was normal in size. There is mild concentric left ventricular hypertrophy. Right Ventricle: The right ventricular size is normal. Right vetricular wall thickness was not assessed.  Right ventricular systolic function is normal. Left Atrium: Left atrial size was normal in size. Right Atrium: Right atrial size was normal in size. Pericardium: There is no evidence of pericardial effusion. Mitral Valve: There is mild thickening of the mitral valve leaflet(s). Trivial mitral valve regurgitation. Tricuspid Valve: The tricuspid valve is normal in structure. Tricuspid valve regurgitation is trivial. Aortic Valve: The aortic valve is tricuspid. Aortic valve regurgitation is trivial. Aortic regurgitation PHT measures 495 msec. Pulmonic Valve: The pulmonic valve was not well visualized. Pulmonic valve regurgitation is not visualized. No evidence of pulmonic stenosis. Aorta: The aortic root and ascending aorta are structurally normal, with no evidence of dilitation. IAS/Shunts: No atrial level shunt detected by color flow  Doppler.  LEFT VENTRICLE PLAX 2D LVIDd:         5.30 cm      Diastology LVIDs:         3.90 cm      LV e' medial:    5.77 cm/s LV PW:         1.20 cm      LV E/e' medial:  14.9 LV IVS:        1.30 cm      LV e' lateral:   9.90 cm/s LVOT diam:     2.00 cm      LV E/e' lateral: 8.7 LV SV:         66 LV SV Index:   29 LVOT Area:     3.14 cm  LV Volumes (MOD) LV vol d, MOD A2C: 139.0 ml LV vol d, MOD A4C: 166.0 ml LV vol s, MOD A2C: 75.0 ml LV vol s, MOD A4C: 90.6 ml LV SV MOD A2C:     64.0 ml LV SV MOD A4C:     166.0 ml LV SV MOD BP:      75.8 ml RIGHT VENTRICLE             IVC RV Basal diam:  4.00 cm     IVC diam: 1.70 cm RV S prime:     13.10 cm/s TAPSE (M-mode): 2.0 cm LEFT ATRIUM             Index        RIGHT ATRIUM           Index LA diam:        3.50 cm 1.56 cm/m   RA Area:     19.70 cm LA Vol (A2C):   68.4 ml 30.54 ml/m  RA Volume:   58.60 ml  26.16 ml/m LA Vol (A4C):   46.1 ml 20.58 ml/m LA Biplane Vol: 60.5 ml 27.01 ml/m  AORTIC VALVE LVOT Vmax:   101.00 cm/s LVOT Vmean:  71.500 cm/s LVOT VTI:    0.210 m AI PHT:      495 msec  AORTA Ao Root diam: 3.10 cm Ao Asc diam:  3.70 cm MITRAL VALVE MV Area (PHT): 4.39 cm    SHUNTS MV Decel Time: 173 msec    Systemic VTI:  0.21 m MV E velocity: 85.70 cm/s  Systemic Diam: 2.00 cm MV A velocity: 95.10 cm/s MV E/A ratio:  0.90 Dorris Carnes MD Electronically signed by Dorris Carnes MD Signature Date/Time: 07/04/2022/6:26:22 PM    Final (Updated)         Scheduled Meds:  Chlorhexidine Gluconate Cloth  6 each Topical Daily   docusate sodium  100 mg Oral BID   finasteride  5 mg Oral BID   levothyroxine  112 mcg Oral Daily   metoprolol succinate  25 mg Oral Daily   oxybutynin  5 mg Oral Daily   sacubitril-valsartan  1 tablet Oral BID   tamsulosin  0.4 mg Oral BID   Continuous Infusions:   ceFAZolin (ANCEF) IV 2 g (07/05/22 1633)   sodium chloride irrigation       LOS: 2 days    Time spent: 40 minutes    Irine Seal, MD [facility]   To contact the attending provider between 7A-7P or the covering provider during after hours 7P-7A, please log into the web site [email or web address] and access using universal [place] password for that web site. If you do not have the password, please call the hospital operator.  07/05/2022, 5:31 PM

## 2022-07-05 NOTE — Progress Notes (Addendum)
Notifed pt with recurrent hematuria. Nursing staff unable to irrigate. Assessed at bedside. 18 Fr catheter removed. Placed 24 french 3 way with return of 1L pink urine. Irrigates with minimal clot. Started CBI. Pt tolerated well and feels much better that bladder empty. Urine running clear see through on fast drip.    A/P Gross hematuria: Again suspect etiology prostatic, possible trauma with foley balloon into prostate -Keep 24 3 way catheter hubbed at penis. Do not use a stat lock as this is contributing to tension and likely hematuria. -Titrate CBI to light pink. -No indication for cysto/fulguration at this time, however if clot retention, this would be indicated -Will order CT hematuria protocol -Needs cysto outpatient -Continue abx -Continue finasteride -He is undergoing outpatient evaluation with GI and gen surgery to work up his perirectal lymph nodes.  -Following  Matt R. [facility]  Pager: [phone]

## 2022-07-05 NOTE — Progress Notes (Addendum)
       Overnight   NAME: Steven Carter MRN: [id number] DOB : [birth year]    Date of Service   07/05/2022   HPI/Events of Note   Notified by RN for patient complaint of bladder spasms, lack of volume in Foley bag.  Patient increasing discomfort, bladder spasms, lack of urine in collection device. Attempts to flush Foley were unsuccessful Bladder scan revealed greater than 700 mL of urine.   Bedside visit: Due to severe discomfort, Foley exchanged with success and return of 1000 mL of urine .  Patient pain/discomfort is alleviated now Foley is draining properly.    Interventions/ Plan   Maintain Foley as directed by urology previously Monitor for increasing discomfort as prior. Monitor for particulates, blood, clots in urine.       [name] BSN MSNA MSN ACNPC-AG Acute Care Nurse Practitioner [place]

## 2022-07-05 NOTE — Progress Notes (Signed)
Patient continue to have blood clots, obstruction foley/CBI flow, needing frequent hand irrigation with lots of resistance, patient in sever pain, had to change foley again because of obstruction, Dr. Abner Greenspan notified again and on the way to see patient.

## 2022-07-05 NOTE — Progress Notes (Signed)
PCCM Update:  Transfusion orders cancelled as he has been weaned off vasopressor support at this time. Will continue to monitor BP closely overnight.   Freda Jackson, MD [facility] Office: [phone]   See Amion for personal pager PCCM on call pager [phone] until 7pm. Please call [place] 7p-7a. [phone]

## 2022-07-05 NOTE — Progress Notes (Signed)
eLink Physician-Brief Progress Note Patient Name: Steven Carter DOB: 1945/09/20 MRN: [id number]   Date of Service  07/05/2022  HPI/Events of Note  77 year old male with a history of recent urinary tract infection and bacteremia secondary to methicillin sensitive Staphylococcus aureus. He developed significant gross hematuria today complicated by clot retention.  He underwent cystourethroscopy and had a 24 [place] three-way hematuria catheter placed now receiving continuous bladder irrigation.  Required low-dose phenylephrine during the procedure.  Postoperatively, blood pressures seem to be improved but needs low dose phenyl.  Other vital signs stable.  Last hemoglobin 8.2 (down from 10 this morning)   eICU Interventions  Down trending pressors with albumin infusion, abx ordered, no pain or discomfort, urine is peach colored.  No acute intervention indicated, continue to wean phenylephrine as tolerated     Intervention Category Evaluation Type: New Patient Evaluation    07/05/2022, 10:09 PM

## 2022-07-05 NOTE — Progress Notes (Signed)
       Overnight   NAME: Steven Carter MRN: [id number] DOB : Jan 29, 1946    Date of Service   07/05/2022   HPI/Events of Note   Notified by Attending/Anesthesia from PACU for need for change of status due to hypotension requiring vasopressor.  Procedure noted as Cystourethroscopy, clot evacuation with fulguration.      Interventions/ Plan   Transfer to ICU status CCM consulted/ call by Dr Grandville Silos - completed 2019 hrs       [name] BSN MSNA MSN [place]

## 2022-07-05 NOTE — Progress Notes (Addendum)
Pt stated he is having bladder spasms. No flow from foley. Bladder scan show 721m. Notified provider. Irrigation done. No flow again. Place a new foley. Empty 10076m

## 2022-07-05 NOTE — Anesthesia Preprocedure Evaluation (Signed)
Anesthesia Evaluation  Patient identified by MRN, date of birth, ID band Patient awake    Reviewed: Allergy & Precautions, NPO status , Patient's Chart, lab work & pertinent test results  Airway Mallampati: II  TM Distance: >3 FB     Dental  (+) Dental Advisory Given   Pulmonary neg pulmonary ROS   breath sounds clear to auscultation       Cardiovascular hypertension,  Rhythm:Regular Rate:Normal  TTE 07/04/2022  1. No obvious vegetation though cannot completely exclude REcomm TEE if clinically indicated.  2. Left ventricular ejection fraction, by estimation, is 35%. The left ventricle demonstrates global hypokinesis. There is mild concentric left ventricular hypertrophy.  3. Right ventricular systolic function is normal. The right ventricular size is normal.  4. Trivial mitral valve regurgitation.  5. The aortic valve is tricuspid. Aortic valve regurgitation is trivial    Neuro/Psych negative neurological ROS     GI/Hepatic negative GI ROS, Neg liver ROS,,,  Endo/Other  Hypothyroidism    Renal/GU      Musculoskeletal   Abdominal   Peds  Hematology  (+) Blood dyscrasia, anemia   Anesthesia Other Findings   Reproductive/Obstetrics                              Anesthesia Physical Anesthesia Plan  ASA: 3 and emergent  Anesthesia Plan: General   Post-op Pain Management:    Induction: Intravenous  PONV Risk Score and Plan: 2 and Dexamethasone, Ondansetron and Treatment may vary due to age or medical condition  Airway Management Planned: Oral ETT  Additional Equipment:   Intra-op Plan:   Post-operative Plan: Extubation in OR  Informed Consent: I have reviewed the patients History and Physical, chart, labs and discussed the procedure including the risks, benefits and alternatives for the proposed anesthesia with the patient or authorized representative who has indicated his/her  understanding and acceptance.     Dental advisory given  Plan Discussed with:   Anesthesia Plan Comments:          Anesthesia Quick Evaluation

## 2022-07-05 NOTE — Transfer of Care (Signed)
Immediate Anesthesia Transfer of Care Note  Patient: Steven Carter  Procedure(s) Performed: CYSTOSCOPY WITH FULGERATION, CLOT EVACUATION  Patient Location: PACU  Anesthesia Type:General  Level of Consciousness: awake, alert , and oriented  Airway & Oxygen Therapy: Patient Spontanous Breathing and Patient connected to face mask oxygen  Post-op Assessment: Report given to RN and Post -op Vital signs reviewed and stable  Post vital signs: Reviewed and stable  Last Vitals:  Vitals Value Taken Time  BP 96/61 07/05/22 1951  Temp    Pulse 101 07/05/22 1953  Resp 15 07/05/22 1953  SpO2 100 % 07/05/22 1953  Vitals shown include unvalidated device data.  Last Pain:  Vitals:   07/05/22 1810  TempSrc: Oral  PainSc: 0-No pain      Patients Stated Pain Goal: 3 ([id number])  Complications: No notable events documented.

## 2022-07-05 NOTE — Progress Notes (Signed)
Notified charge RN had to exchange 24 Fr catheter around 3PM as it clotted off on high flow CBI. Irrigated 24 Fr 3 way with return of 100cc clots. Irrigates easily and pink after irrigation. CT hematuria still not done. Pt feels subjectively weaker. Afebrile.  -Changed CT hematuria order to stat to evaluate source of bleeding -Bedside ultrasound with significant clot burden -CBC -NPO -Added on for cysto fulguration tonight as he continues to clot off on high flow CBI. Called OR.   Matt R. [facility]  Pager: [phone]

## 2022-07-05 NOTE — Consult Note (Addendum)
NAME:  Steven Carter, MRN:  [id number], DOB:  1946/02/09, LOS: 2 ADMISSION DATE:  07/02/2022, CONSULTATION DATE:  07/05/22 REFERRING MD:  Irine Seal, MD CHIEF COMPLAINT: Hypotension    History of Present Illness:  Steven Carter is a 77 year old male with history of hypothyroidism and BPH who is admitted with MSSA UTI and bacteremia along with hematuria. He had issues with clotting of foley catheter today and had foley exchanged for 16f catheter and put on high flow CBI. He was taken to the OR by Urology this evening due to on going hematuria. He had bleeding at small area of left bladder neck and left lateral lobe of prostate that was treated with electrocautery with good effect. He was started on neosynephrine during the procedure and briefly weaned off in the PACU but then resumed due to return of hypotension.   Post-op hemoglobin was 8.2g/dL from 10 g/dL.   Patient is feeling better post-op. He denies any breathing issues, chest pain, abdominal pain or nausea.   Pertinent  Medical History   Past Medical History:  Diagnosis Date   BPH (benign prostatic hyperplasia)    Hypothyroidism    Thyroid disease    Significant Hospital Events: Including procedures, antibiotic start and stop dates in addition to other pertinent events   3/2 admitted for Hematuria, started on rocephin 3/3 ID consulted for MSSA bacteremia and UTI, started on cefazolin   Interim History / Subjective:  As above in HPI  Objective   Blood pressure 110/69, pulse 83, temperature (!) 97.4 F (36.3 C), resp. rate 20, height '6\' 1"'$  (1.854 m), weight 99.7 kg, SpO2 95 %.        Intake/Output Summary (Last 24 hours) at 07/05/2022 2121 Last data filed at 07/05/2022 2059 Gross per 24 hour  Intake 2390 ml  Output 17900 ml  Net -15510 ml   Filed Weights   07/03/22 0519  Weight: 99.7 kg   Examination: General: elderly male, no acute distress, pale appearing, resting in bed HENT: [place]/AT, moist mucous membranes,  sclera anicteric Lungs: clear to auscultation bilaterally, no wheezing Cardiovascular: rrr, no murmurs Abdomen: soft, non-tender, non-distended, BS+ Extremities: warm, no edema Neuro: alert, oriented x 3, moving all extremities GU: foley in place  Resolved Hospital Problem list     Assessment & Plan:  Hematuria MSSA Urinary Tract Infection MSSA Bacteremia Acute Blood Loss Anemia due to hematuria Post-Operative Hypotension Acute Heart Failure reduced EF, 35%   Plan: - Continue phenylephrine for MAP goal of 65 or greater - Will transfuse 1 unit PRBCs - Ordered albumin by primary team - If patient remains on vasopressor support by tomorrow morning, PCCM will take over as primary service. - CBI per urology for hematuria - Follow up CT Urinary Tract results - Cardiology following for heart failure, hold entresto and metoprolol while on pressors - Continue cefazolin for MSSA UTI and bacteremia per ID - Will need TEE for evaluation of possible endocarditis   Best Practice (right click and "Reselect all SmartList Selections" daily)   Diet/type: NPO w/ oral meds DVT prophylaxis: SCD GI prophylaxis: N/A Lines: N/A Foley:  Yes, and it is still needed Code Status:  full code Last date of multidisciplinary goals of care discussion [pending]  Labs   CBC: Recent Labs  Lab 07/02/22 0838 07/03/22 0424 07/04/22 0448 07/05/22 0518 07/05/22 1737 07/05/22 1829 07/05/22 2038  WBC 15.0* 13.5* 6.3 6.9 9.0  --  8.8  NEUTROABS 11.9*  --  3.7 2.9 6.4  --   --  HGB 11.9* 10.5* 10.2* 10.0* 9.7* 10.2* 8.2*  HCT 34.9* 32.4* 32.0* 30.6* 30.6* 30.0* 25.5*  MCV 90.6 95.3 97.0 93.6 95.9  --  95.5  PLT 147* 140* 129* 141* 148*  --  [id number]    Basic Metabolic Panel: Recent Labs  Lab 07/02/22 0838 07/03/22 0424 07/04/22 0448 07/05/22 0518 07/05/22 1829  NA 134* 137 136 136 135  K 3.8 4.0 3.9 4.2 4.7  CL 100 107 106 105  --   CO2 20* '23 22 24  '$ --   GLUCOSE 142* 98 110* 112*  --   BUN  '21 17 14 14  '$ --   CREATININE 1.18 1.05 0.98 0.94  --   CALCIUM 9.2 8.2* 8.0* 8.3*  --   MG  --   --  1.9 2.0  --    GFR: Estimated Creatinine Clearance: 84.3 mL/min (by C-G formula based on SCr of 0.94 mg/dL). Recent Labs  Lab 07/04/22 0448 07/05/22 0518 07/05/22 1737 07/05/22 2038  WBC 6.3 6.9 9.0 8.8    Liver Function Tests: No results for input(s): "AST", "ALT", "ALKPHOS", "BILITOT", "PROT", "ALBUMIN" in the last 168 hours. No results for input(s): "LIPASE", "AMYLASE" in the last 168 hours. No results for input(s): "AMMONIA" in the last 168 hours.  ABG    Component Value Date/Time   HCO3 22.9 07/05/2022 1829   TCO2 24 07/05/2022 1829   ACIDBASEDEF 3.0 (H) 07/05/2022 1829   O2SAT 27 07/05/2022 1829     Coagulation Profile: Recent Labs  Lab 07/02/22 0838  INR 1.3*    Cardiac Enzymes: No results for input(s): "CKTOTAL", "CKMB", "CKMBINDEX", "TROPONINI" in the last 168 hours.  HbA1C: No results found for: "HGBA1C"  CBG: No results for input(s): "GLUCAP" in the last 168 hours.  Review of Systems:   A 10 point review of systems was performed and is otherwise negative unless stated above.  Past Medical History:  He,  has a past medical history of BPH (benign prostatic hyperplasia), Hypothyroidism, and Thyroid disease.   Surgical History:  History reviewed. No pertinent surgical history.   Social History:   reports that he has never smoked. He has never used smokeless tobacco. He reports current alcohol use of about 7.0 standard drinks of alcohol per week. He reports that he does not use drugs.   Family History:  His family history is not on file.   Allergies No Known Allergies   Home Medications  Prior to Admission medications   Medication Sig Start Date End Date Taking? Authorizing Provider  cephALEXin (KEFLEX) 500 MG capsule Take 1 capsule (500 mg total) by mouth 4 (four) times daily. 07/02/22  Yes Regan Lemming, MD  finasteride (PROSCAR) 5 MG tablet  Take 5 mg by mouth daily. 04/19/19  Yes [provider]  levofloxacin (LEVAQUIN) 750 MG tablet Take 750 mg by mouth daily. 07/01/22  Yes [provider]  levothyroxine (SYNTHROID) 112 MCG tablet Take 112 mcg by mouth daily. 01/17/19  Yes [provider]  oxybutynin (DITROPAN-XL) 5 MG 24 hr tablet Take 5 mg by mouth daily. 06/28/22  Yes [provider]  tamsulosin (FLOMAX) 0.4 MG CAPS capsule Take 0.8 mg by mouth daily.   Yes [provider]  gentamicin cream (GARAMYCIN) 0.1 % Apply 1 application topically 2 (two) times daily. Patient not taking: Reported on 07/02/2022 04/03/19   Edrick Kins, DPM  meloxicam (MOBIC) 15 MG tablet Take 15 mg by mouth daily as needed. Patient not taking: Reported on 07/02/2022 03/31/19  [provider]    Critical care time: 40 minutes    Freda Jackson, MD [place] Office: [phone]   See Amion for personal pager PCCM on call pager [phone] until 7pm. Please call Elink 7p-7a. [phone]

## 2022-07-05 NOTE — Progress Notes (Signed)
Patient taken to OR.

## 2022-07-05 NOTE — Progress Notes (Signed)
Subjective: Notified pt with red urine with clots overnight. Irrigated this AM with removal of 5m clot until catheter clear. Catheter was on tension, possible cause of bleeding. Secured closer to penis and nearly hubbed given long prostatic urethra.   Objective: Vital signs in last 24 hours: Temp:  [98.2 F (36.8 C)-99 F (37.2 C)] 98.4 F (36.9 C) (03/05 0454) Pulse Rate:  [73-91] 73 (03/05 0454) Resp:  [18-19] 19 (03/05 0454) BP: (144-160)/(81-88) 144/81 (03/05 0454) SpO2:  [97 %-98 %] 97 % (03/05 0454)  Intake/Output from previous day: 03/04 0701 - 03/05 0700 In: 2771.9 [P.O.:1320; I.V.:1451.9] Out: 4900 [Urine:4900] Intake/Output this shift: No intake/output data recorded.  Physical Exam:  General: Alert and oriented CV: RRR Lungs: Clear Abdomen: Soft, ND, NT Ext: NT, No erythema  Lab Results: Recent Labs    07/03/22 0424 07/04/22 0448 07/05/22 0518  HGB 10.5* 10.2* 10.0*  HCT 32.4* 32.0* 30.6*   BMET Recent Labs    07/04/22 0448 07/05/22 0518  NA 136 136  K 3.9 4.2  CL 106 105  CO2 22 24  GLUCOSE 110* 112*  BUN 14 14  CREATININE 0.98 0.94  CALCIUM 8.0* 8.3*     Studies/Results: ECHOCARDIOGRAM COMPLETE  Result Date: 07/04/2022    ECHOCARDIOGRAM REPORT   Patient Name:   Steven ARRENDALEDate of Exam: 07/04/2022 Medical Rec #:  [id number]      Height:       73.0 in Accession #:    [id number]     Weight:       219.8 lb Date of Birth:  422-Aug-1947      BSA:          2.240 m Patient Age:    89years        BP:           159/88 mmHg Patient Gender: M               HR:           73 bpm. Exam Location:  Inpatient Procedure: 2D Echo, Cardiac Doppler and Color Doppler                                MODIFIED REPORT:      This report was modified by PDorris CarnesMD on 07/04/2022 due to Complete                                   impression.  Indications:     bacteremia  History:         Patient has no prior history of Echocardiogram examinations.  Sonographer:     LPhineas DouglasReferring Phys:  [email or web address] Phys: PDorris CarnesMD IMPRESSIONS  1. No obvious vegetation though cannot completely exclude REcomm TEE if clinically indicated.  2. Left ventricular ejection fraction, by estimation, is 35%. The left ventricle demonstrates global hypokinesis. There is mild concentric left ventricular hypertrophy.  3. Right ventricular systolic function is normal. The right ventricular size is normal.  4. Trivial mitral valve regurgitation.  5. The aortic valve is tricuspid. Aortic valve regurgitation is trivial. FINDINGS  Left Ventricle: Left ventricular ejection fraction, by estimation, is 35%. The left ventricle demonstrates global hypokinesis. The left ventricular internal cavity size was normal in size. There is mild concentric left ventricular hypertrophy.  Right Ventricle: The right ventricular size is normal. Right vetricular wall thickness was not assessed. Right ventricular systolic function is normal. Left Atrium: Left atrial size was normal in size. Right Atrium: Right atrial size was normal in size. Pericardium: There is no evidence of pericardial effusion. Mitral Valve: There is mild thickening of the mitral valve leaflet(s). Trivial mitral valve regurgitation. Tricuspid Valve: The tricuspid valve is normal in structure. Tricuspid valve regurgitation is trivial. Aortic Valve: The aortic valve is tricuspid. Aortic valve regurgitation is trivial. Aortic regurgitation PHT measures 495 msec. Pulmonic Valve: The pulmonic valve was not well visualized. Pulmonic valve regurgitation is not visualized. No evidence of pulmonic stenosis. Aorta: The aortic root and ascending aorta are structurally normal, with no evidence of dilitation. IAS/Shunts: No atrial level shunt detected by color flow Doppler.  LEFT VENTRICLE PLAX 2D LVIDd:         5.30 cm      Diastology LVIDs:         3.90 cm      LV e' medial:    5.77 cm/s LV PW:         1.20 cm      LV E/e' medial:  14.9 LV IVS:         1.30 cm      LV e' lateral:   9.90 cm/s LVOT diam:     2.00 cm      LV E/e' lateral: 8.7 LV SV:         66 LV SV Index:   29 LVOT Area:     3.14 cm  LV Volumes (MOD) LV vol d, MOD A2C: 139.0 ml LV vol d, MOD A4C: 166.0 ml LV vol s, MOD A2C: 75.0 ml LV vol s, MOD A4C: 90.6 ml LV SV MOD A2C:     64.0 ml LV SV MOD A4C:     166.0 ml LV SV MOD BP:      75.8 ml RIGHT VENTRICLE             IVC RV Basal diam:  4.00 cm     IVC diam: 1.70 cm RV S prime:     13.10 cm/s TAPSE (M-mode): 2.0 cm LEFT ATRIUM             Index        RIGHT ATRIUM           Index LA diam:        3.50 cm 1.56 cm/m   RA Area:     19.70 cm LA Vol (A2C):   68.4 ml 30.54 ml/m  RA Volume:   58.60 ml  26.16 ml/m LA Vol (A4C):   46.1 ml 20.58 ml/m LA Biplane Vol: 60.5 ml 27.01 ml/m  AORTIC VALVE LVOT Vmax:   101.00 cm/s LVOT Vmean:  71.500 cm/s LVOT VTI:    0.210 m AI PHT:      495 msec  AORTA Ao Root diam: 3.10 cm Ao Asc diam:  3.70 cm MITRAL VALVE MV Area (PHT): 4.39 cm    SHUNTS MV Decel Time: 173 msec    Systemic VTI:  0.21 m MV E velocity: 85.70 cm/s  Systemic Diam: 2.00 cm MV A velocity: 95.10 cm/s MV E/A ratio:  0.90 Dorris Carnes MD Electronically signed by Dorris Carnes MD Signature Date/Time: 07/04/2022/6:26:22 PM    Final (Updated)     Assessment/Plan: Hematuria: resolved UTI Elevated PSA/prostate lesion on MRI BPH/retention  -Leave foley catheter to gravity drainage. Now clear. Irrigate  as needed for decreased drainage or clots. Ensure catheter secured without tension, likely contributing to bleeding. -Continue ancef -Urine cx >100K staph -Appreciate ID recs -Will arrange outpatient f/u for cysto and [place] evaluation -Will delay MRI fusion biopsy until he recovers from infection -He is undergoing outpatient evaluation with GI and gen surgery to work up his perirectal lymph nodes -Following peripherally. Please call with questions.   LOS: 2 days   Matt R.  MD 07/05/2022, 7:52 AM [facility]  Pager: [phone]

## 2022-07-05 NOTE — Anesthesia Procedure Notes (Signed)
Procedure Name: Intubation Date/Time: 07/05/2022 7:00 PM  Performed by: Gean Maidens, CRNAPre-anesthesia Checklist: Emergency Drugs available, Patient identified, Suction available, Patient being monitored and Timeout performed Patient Re-evaluated:Patient Re-evaluated prior to induction Oxygen Delivery Method: Circle system utilized Preoxygenation: Pre-oxygenation with 100% oxygen Induction Type: IV induction Ventilation: Mask ventilation without difficulty Laryngoscope Size: Mac Grade View: Grade I Tube type: Oral Tube size: 7.5 mm Number of attempts: 1 Airway Equipment and Method: Stylet Placement Confirmation: ETT inserted through vocal cords under direct vision, positive ETCO2 and breath sounds checked- equal and bilateral Secured at: 23 cm Tube secured with: Tape Dental Injury: Teeth and Oropharynx as per pre-operative assessment

## 2022-07-06 ENCOUNTER — Inpatient Hospital Stay (HOSPITAL_COMMUNITY): Payer: Medicare Other

## 2022-07-06 ENCOUNTER — Encounter (HOSPITAL_COMMUNITY): Payer: Self-pay | Admitting: Urology

## 2022-07-06 DIAGNOSIS — D62 Acute posthemorrhagic anemia: Secondary | ICD-10-CM | POA: Diagnosis not present

## 2022-07-06 DIAGNOSIS — R7881 Bacteremia: Secondary | ICD-10-CM | POA: Diagnosis not present

## 2022-07-06 DIAGNOSIS — N179 Acute kidney failure, unspecified: Secondary | ICD-10-CM

## 2022-07-06 DIAGNOSIS — I5022 Chronic systolic (congestive) heart failure: Secondary | ICD-10-CM | POA: Insufficient documentation

## 2022-07-06 DIAGNOSIS — E039 Hypothyroidism, unspecified: Secondary | ICD-10-CM | POA: Insufficient documentation

## 2022-07-06 DIAGNOSIS — R590 Localized enlarged lymph nodes: Secondary | ICD-10-CM | POA: Insufficient documentation

## 2022-07-06 DIAGNOSIS — N3001 Acute cystitis with hematuria: Secondary | ICD-10-CM | POA: Diagnosis not present

## 2022-07-06 DIAGNOSIS — B9561 Methicillin susceptible Staphylococcus aureus infection as the cause of diseases classified elsewhere: Secondary | ICD-10-CM | POA: Diagnosis not present

## 2022-07-06 DIAGNOSIS — D500 Iron deficiency anemia secondary to blood loss (chronic): Secondary | ICD-10-CM

## 2022-07-06 LAB — CBC WITH DIFFERENTIAL/PLATELET
Abs Immature Granulocytes: 0.25 10*3/uL — ABNORMAL HIGH (ref 0.00–0.07)
Basophils Absolute: 0 10*3/uL (ref 0.0–0.1)
Basophils Relative: 0 %
Eosinophils Absolute: 0 10*3/uL (ref 0.0–0.5)
Eosinophils Relative: 0 %
HCT: 23.7 % — ABNORMAL LOW (ref 39.0–52.0)
Hemoglobin: 7.7 g/dL — ABNORMAL LOW (ref 13.0–17.0)
Immature Granulocytes: 3 %
Lymphocytes Relative: 11 %
Lymphs Abs: 1 10*3/uL (ref 0.7–4.0)
MCH: 30.9 pg (ref 26.0–34.0)
MCHC: 32.5 g/dL (ref 30.0–36.0)
MCV: 95.2 fL (ref 80.0–100.0)
Monocytes Absolute: 1.2 10*3/uL — ABNORMAL HIGH (ref 0.1–1.0)
Monocytes Relative: 13 %
Neutro Abs: 6.8 10*3/uL (ref 1.7–7.7)
Neutrophils Relative %: 73 %
Platelets: 142 10*3/uL — ABNORMAL LOW (ref 150–400)
RBC: 2.49 MIL/uL — ABNORMAL LOW (ref 4.22–5.81)
RDW: 14.4 % (ref 11.5–15.5)
WBC: 9.2 10*3/uL (ref 4.0–10.5)
nRBC: 0 % (ref 0.0–0.2)

## 2022-07-06 LAB — COMPREHENSIVE METABOLIC PANEL
ALT: 14 U/L (ref 0–44)
AST: 21 U/L (ref 15–41)
Albumin: 3.4 g/dL — ABNORMAL LOW (ref 3.5–5.0)
Alkaline Phosphatase: 39 U/L (ref 38–126)
Anion gap: 8 (ref 5–15)
BUN: 25 mg/dL — ABNORMAL HIGH (ref 8–23)
CO2: 23 mmol/L (ref 22–32)
Calcium: 8.5 mg/dL — ABNORMAL LOW (ref 8.9–10.3)
Chloride: 104 mmol/L (ref 98–111)
Creatinine, Ser: 1.68 mg/dL — ABNORMAL HIGH (ref 0.61–1.24)
GFR, Estimated: 42 mL/min — ABNORMAL LOW (ref >60)
Glucose, Bld: 156 mg/dL — ABNORMAL HIGH (ref 70–99)
Potassium: 4.7 mmol/L (ref 3.5–5.1)
Sodium: 135 mmol/L (ref 135–145)
Total Bilirubin: 0.4 mg/dL (ref 0.3–1.2)
Total Protein: 6.1 g/dL — ABNORMAL LOW (ref 6.5–8.1)

## 2022-07-06 LAB — LIPID PANEL
Cholesterol: 85 mg/dL (ref 0–200)
HDL: 18 mg/dL — ABNORMAL LOW (ref >40)
LDL Cholesterol: 55 mg/dL (ref 0–99)
Total CHOL/HDL Ratio: 4.7 RATIO
Triglycerides: 59 mg/dL (ref <150)
VLDL: 12 mg/dL (ref 0–40)

## 2022-07-06 LAB — HEMOGLOBIN AND HEMATOCRIT, BLOOD
HCT: 24.4 % — ABNORMAL LOW (ref 39.0–52.0)
Hemoglobin: 7.7 g/dL — ABNORMAL LOW (ref 13.0–17.0)

## 2022-07-06 LAB — HEMOGLOBIN A1C
Hgb A1c MFr Bld: 5.7 % — ABNORMAL HIGH (ref 4.8–5.6)
Mean Plasma Glucose: 117 mg/dL

## 2022-07-06 LAB — MRSA NEXT GEN BY PCR, NASAL: MRSA by PCR Next Gen: NOT DETECTED

## 2022-07-06 LAB — MAGNESIUM: Magnesium: 2.1 mg/dL (ref 1.7–2.4)

## 2022-07-06 MED ORDER — SODIUM CHLORIDE 0.9 % IV SOLN
INTRAVENOUS | Status: AC
  Filled 2022-07-06: qty 250

## 2022-07-06 MED ORDER — IOHEXOL 300 MG/ML  SOLN
100.0000 mL | Freq: Once | INTRAMUSCULAR | Status: AC | PRN
  Administered 2022-07-06: 80 mL via INTRAVENOUS

## 2022-07-06 MED ORDER — SODIUM CHLORIDE (PF) 0.9 % IJ SOLN
INTRAMUSCULAR | Status: AC
  Filled 2022-07-06: qty 50

## 2022-07-06 MED ORDER — SODIUM CHLORIDE 0.9 % IV SOLN: INTRAVENOUS | Status: AC

## 2022-07-06 NOTE — Assessment & Plan Note (Signed)
S/p Cystourethroscopy, Clot Evacuation with Fulguration on 3/5 by Dr. [name] noted trauma in prostatic urethra and right bladder neck, also blood effluxing from Steven Carter on cysto Follow up US renal negative, follow up CT today showed mild right ureteral hydro and debris - Continue CBI - Consult Urology, appreciate cares

## 2022-07-06 NOTE — Progress Notes (Signed)
[place] for Infectious Disease    Date of Admission:  07/02/2022   Total days of antibiotics 5   ID: Steven Carter is a 77 y.o. male with  MSSA uti and secondary bacteremia Principal Problem:   MSSA bacteremia Active Problems:   UTI (urinary tract infection)   Gross hematuria   Tachycardia   Benign prostatic hyperplasia   Hypothyroidism   Chronic systolic CHF (congestive heart failure) (HCC)   AKI (acute kidney injury) ([place])    Subjective: Afebrile, went to or last night for management of hematuria for clot evacuation  Medications:   sodium chloride   Intravenous Once   Chlorhexidine Gluconate Cloth  6 each Topical Daily   docusate sodium  100 mg Oral BID   finasteride  5 mg Oral BID   levothyroxine  112 mcg Oral Daily   oxybutynin  5 mg Oral Daily   tamsulosin  0.4 mg Oral BID    Objective: Vital signs in last 24 hours: Temp:  [97.4 F (36.3 C)-98.5 F (36.9 C)] 97.5 F (36.4 C) (03/06 1200) Pulse Rate:  [70-101] 86 (03/06 1200) Resp:  [12-24] 23 (03/06 1100) BP: (83-146)/(45-79) 126/45 (03/06 1200) SpO2:  [92 %-100 %] 99 % (03/06 1200) Weight:  [98.7 kg] 98.7 kg (03/05 2121)   Physical Exam  Constitutional: He is oriented to person, place, and time. He appears well-developed and well-nourished. No distress.  HENT:  Mouth/Throat: Oropharynx is clear and moist. No oropharyngeal exudate.  Cardiovascular: Normal rate, regular rhythm and normal heart sounds. Exam reveals no gallop and no friction rub.  No murmur heard.  Pulmonary/Chest: Effort normal and breath sounds normal. No respiratory distress. He has no wheezes.  Abdominal: Soft. Bowel sounds are normal. He exhibits no distension. There is no tenderness.  Lymphadenopathy:  He has no cervical adenopathy.  Neurological: He is alert and oriented to person, place, and time.  Skin: Skin is warm and dry. No rash noted. No erythema.  Psychiatric: He has a normal mood and affect. His behavior is  normal.   Lab Results Recent Labs    07/05/22 0518 07/05/22 1737 07/05/22 1829 07/05/22 2038 07/06/22 0249  WBC 6.9   < >  --  8.8 9.2  HGB 10.0*   < > 10.2* 8.2* 7.7*  HCT 30.6*   < > 30.0* 25.5* 23.7*  NA 136  --  135  --  135  K 4.2  --  4.7  --  4.7  CL 105  --   --   --  104  CO2 24  --   --   --  23  BUN 14  --   --   --  25*  CREATININE 0.94  --   --   --  1.68*   < > = values in this interval not displayed.   Liver Panel Recent Labs    07/06/22 0249  PROT 6.1*  ALBUMIN 3.4*  AST 21  ALT 14  ALKPHOS 39  BILITOT 0.4   Sedimentation Rate No results for input(s): "ESRSEDRATE" in the last 72 hours. C-Reactive Protein No results for input(s): "CRP" in the last 72 hours.  Microbiology: 3/4  blood cx NGTd 3/2 blood cx MSSA and urine cx MSSA Staphylococcus aureus      MIC    CIPROFLOXACIN <=0.5 SENSI... Sensitive    CLINDAMYCIN <=0.25 SENS... Sensitive    ERYTHROMYCIN <=0.25 SENS... Sensitive    GENTAMICIN <=0.5 SENSI... Sensitive    Inducible  Clindamycin NEGATIVE Sensitive    OXACILLIN 0.5 SENSITIVE Sensitive    RIFAMPIN <=0.5 SENSI... Sensitive    TETRACYCLINE <=1 SENSITIVE Sensitive    TRIMETH/SULFA <=10 SENSIT... Sensitive    VANCOMYCIN 1 SENSITIVE Sensitive    Studies/Results: CT HEMATURIA WORKUP  Result Date: 07/06/2022 CLINICAL DATA:  Gross hematuria. Status cystoscopy and clot evacuation. Fulguration. EXAM: CT ABDOMEN AND PELVIS WITHOUT AND WITH CONTRAST TECHNIQUE: Multidetector CT imaging of the abdomen and pelvis was performed following the standard protocol before and following the bolus administration of intravenous contrast. RADIATION DOSE REDUCTION: This exam was performed according to the departmental dose-optimization program which includes automated exposure control, adjustment of the mA and/or kV according to patient size and/or use of iterative reconstruction technique. CONTRAST:  80m OMNIPAQUE IOHEXOL 300 MG/ML  SOLN COMPARISON:  Prostate MRI  06/24/2022 FINDINGS: Lower chest: Lung bases are clear. Hepatobiliary: No focal hepatic lesion. Normal gallbladder. No biliary duct dilatation. Common bile duct is normal. Pancreas: Pancreas is normal. No ductal dilatation. No pancreatic inflammation. Spleen: Normal spleen Adrenals/urinary tract: Adrenal glands normal. No enhancing renal cortical lesion. Mild pelvicaliectasis and hydroureter on the RIGHT. No obstructing lesion identified. Bladder is collapsed from Foley catheter. Enlarged prostate gland again noted. Delayed imaging demonstrates contrast through the entirety of the LEFT ureter entering the bladder. No obstruction. There is delayed excretion and no significant contrast within the mildly dilated RIGHT ureter. Stomach/Bowel: The stomach, duodenum, and small bowel normal. The colon and rectosigmoid colon are normal. Vascular/Lymphatic: Abdominal aorta normal caliber. Multiple small lymph nodes in the operator space is. Small lymph node in the LEFT perirectal fat measuring 5 mm on image 83/4) Reproductive: Prostatomegaly.  Foley catheter in place. Other: No free fluid. Musculoskeletal: No aggressive osseous lesion. IMPRESSION: 1. Mild pelvicaliectasis and hydroureter on the RIGHT. No obstructing lesion identified. Potential debris within the RIGHT ureter. RIGHT ureter not opacified on postcontrast delayed imaging. 2. Bladder collapsed from Foley catheter. 3. Prostatomegaly. 4. Small lymph nodes in the operator space and LEFT perirectal fat. Electronically Signed   By: SSuzy BouchardM.D.   On: 07/06/2022 12:50   [place]  Result Date: 07/05/2022 CLINICAL DATA:  Gross hematuria EXAM: RENAL / URINARY TRACT ULTRASOUND COMPLETE COMPARISON:  CT abdomen 09/09/2010 FINDINGS: Right Kidney: Renal measurements: 11.2 by 6.2 by 4.8 cm = volume: 176 mL. Echogenicity within normal limits. No mass or hydronephrosis visualized. Left Kidney: Renal measurements: 11.5 by 7.0 by 4.7 cm = volume: 199 mL. Echogenicity  within normal limits. No mass or hydronephrosis visualized. Bladder: Foley catheter noted in collapsed urinary bladder, heterogeneous masslike appearance around the catheter could be from clot, tumor, or extensive wall thickening from neurogenic bladder. Other: Prostatomegaly suspected. IMPRESSION: 1. No hydronephrosis. 2. Foley catheter noted in collapsed urinary bladder, heterogeneous masslike appearance around the catheter could be from intraluminal clot, tumor, or extensive wall thickening from neurogenic bladder. Cystoscopy may be indicated. 3. Prostatomegaly. Electronically Signed   By: WVan ClinesM.D.   On: 07/05/2022 18:04   ECHOCARDIOGRAM COMPLETE  Result Date: 07/04/2022    ECHOCARDIOGRAM REPORT   Patient Name:   Steven ZEEDYKDate of Exam: 07/04/2022 Medical Rec #:  [id number]      Height:       73.0 in Accession #:    [id number]     Weight:       219.8 lb Date of Birth:  4Oct 11, 1947      BSA:  2.240 m Patient Age:    50 years        BP:           159/88 mmHg Patient Gender: M               HR:           73 bpm. Exam Location:  Inpatient Procedure: 2D Echo, Cardiac Doppler and Color Doppler                                MODIFIED REPORT:      This report was modified by Dorris Carnes MD on 07/04/2022 due to Complete                                   impression.  Indications:     bacteremia  History:         Patient has no prior history of Echocardiogram examinations.  Sonographer:     Phineas Douglas Referring Phys:  [place] Diagnosing Phys: Dorris Carnes MD IMPRESSIONS  1. No obvious vegetation though cannot completely exclude REcomm TEE if clinically indicated.  2. Left ventricular ejection fraction, by estimation, is 35%. The left ventricle demonstrates global hypokinesis. There is mild concentric left ventricular hypertrophy.  3. Right ventricular systolic function is normal. The right ventricular size is normal.  4. Trivial mitral valve regurgitation.  5. The aortic valve is  tricuspid. Aortic valve regurgitation is trivial. FINDINGS  Left Ventricle: Left ventricular ejection fraction, by estimation, is 35%. The left ventricle demonstrates global hypokinesis. The left ventricular internal cavity size was normal in size. There is mild concentric left ventricular hypertrophy. Right Ventricle: The right ventricular size is normal. Right vetricular wall thickness was not assessed. Right ventricular systolic function is normal. Left Atrium: Left atrial size was normal in size. Right Atrium: Right atrial size was normal in size. Pericardium: There is no evidence of pericardial effusion. Mitral Valve: There is mild thickening of the mitral valve leaflet(s). Trivial mitral valve regurgitation. Tricuspid Valve: The tricuspid valve is normal in structure. Tricuspid valve regurgitation is trivial. Aortic Valve: The aortic valve is tricuspid. Aortic valve regurgitation is trivial. Aortic regurgitation PHT measures 495 msec. Pulmonic Valve: The pulmonic valve was not well visualized. Pulmonic valve regurgitation is not visualized. No evidence of pulmonic stenosis. Aorta: The aortic root and ascending aorta are structurally normal, with no evidence of dilitation. IAS/Shunts: No atrial level shunt detected by color flow Doppler.  LEFT VENTRICLE PLAX 2D LVIDd:         5.30 cm      Diastology LVIDs:         3.90 cm      LV e' medial:    5.77 cm/s LV PW:         1.20 cm      LV E/e' medial:  14.9 LV IVS:        1.30 cm      LV e' lateral:   9.90 cm/s LVOT diam:     2.00 cm      LV E/e' lateral: 8.7 LV SV:         66 LV SV Index:   29 LVOT Area:     3.14 cm  LV Volumes (MOD) LV vol d, MOD A2C: 139.0 ml LV vol d, MOD A4C: 166.0 ml LV vol  s, MOD A2C: 75.0 ml LV vol s, MOD A4C: 90.6 ml LV SV MOD A2C:     64.0 ml LV SV MOD A4C:     166.0 ml LV SV MOD BP:      75.8 ml RIGHT VENTRICLE             IVC RV Basal diam:  4.00 cm     IVC diam: 1.70 cm RV S prime:     13.10 cm/s TAPSE (M-mode): 2.0 cm LEFT ATRIUM              Index        RIGHT ATRIUM           Index LA diam:        3.50 cm 1.56 cm/m   RA Area:     19.70 cm LA Vol (A2C):   68.4 ml 30.54 ml/m  RA Volume:   58.60 ml  26.16 ml/m LA Vol (A4C):   46.1 ml 20.58 ml/m LA Biplane Vol: 60.5 ml 27.01 ml/m  AORTIC VALVE LVOT Vmax:   101.00 cm/s LVOT Vmean:  71.500 cm/s LVOT VTI:    0.210 m AI PHT:      495 msec  AORTA Ao Root diam: 3.10 cm Ao Asc diam:  3.70 cm MITRAL VALVE MV Area (PHT): 4.39 cm    SHUNTS MV Decel Time: 173 msec    Systemic VTI:  0.21 m MV E velocity: 85.70 cm/s  Systemic Diam: 2.00 cm MV A velocity: 95.10 cm/s MV E/A ratio:  0.90 Dorris Carnes MD Electronically signed by Dorris Carnes MD Signature Date/Time: 07/04/2022/6:26:22 PM    Final (Updated)      Assessment/Plan: MSSA UTI with secondary bacteremia thought to be due to urologic instrumentation = continue on cefazolin, awaiting TEE to decide on length of course of therapy  Hematuria = defer to urology for further management. POD#1 from clot evacuation with fulguration. Would monitor H/H. Continues on CBI.  [facility] for Infectious Diseases Pager: [phone]  07/06/2022, 2:38 PM

## 2022-07-06 NOTE — Progress Notes (Signed)
Heart Failure Navigator Progress Note  Following this hospitalization to assess for HV TOC readiness.   EF 35% HFrEF Plan TEE 07/07/2022 MSSA bacteremia Plan to interview for HF [facility] appointment  Earnestine Leys, BSN, RN Heart Failure Nurse Navigator Secure Chat Only

## 2022-07-06 NOTE — TOC Initial Note (Signed)
Transition of Care [facility]) - Initial/Assessment Note    Patient Details  Name: Steven Carter MRN: [id number] Date of Birth: [birth year]  Transition of [facility]) CM/SW Contact:    Dessa Phi, RN Phone Number: 07/06/2022, 10:38 AM  Clinical Narrative: MSSA Bacteremia, TEE today. Monitor for d/c plans.                  Expected Discharge Plan: Home/Self Care Barriers to Discharge: Continued Medical Work up   Patient Goals and CMS Choice Patient states their goals for this hospitalization and ongoing recovery are::  (Home)          Expected Discharge Plan and Services                                              Prior Living Arrangements/Services                       Activities of Daily Living Home Assistive Devices/Equipment: Eyeglasses, Hearing aid ADL Screening (condition at time of admission) Patient's cognitive ability adequate to safely complete daily activities?: Yes Is the patient deaf or have difficulty hearing?: Yes Does the patient have difficulty seeing, even when wearing glasses/contacts?: No Does the patient have difficulty concentrating, remembering, or making decisions?: No Patient able to express need for assistance with ADLs?: Yes Does the patient have difficulty dressing or bathing?: No Independently performs ADLs?: Yes (appropriate for developmental age) Does the patient have difficulty walking or climbing stairs?: No Weakness of Legs: None Weakness of Arms/Hands: None  Permission Sought/Granted                  Emotional Assessment              Admission diagnosis:  UTI (urinary tract infection) [N39.0] Gross hematuria [R31.0] SIRS (systemic inflammatory response syndrome) ([place]) [R65.10] Problem with Foley catheter, initial encounter ([place]) [id number]] Urinary tract infection associated with indwelling urethral catheter, initial encounter ([place]) [id number], N39.0] Patient Active Problem List   Diagnosis Date Noted    Hypothyroidism [id number]   Chronic systolic CHF (congestive heart failure) ([place]) 07/06/2022   AKI (acute kidney injury) ([place]) 07/06/2022   MSSA bacteremia 07/03/2022   Benign prostatic hyperplasia 07/03/2022   UTI (urinary tract infection) 07/02/2022   Gross hematuria 07/02/2022   Tachycardia 07/02/2022   PCP:  [place], [place]:   [facility] [address] [place] - [address] [place] [place] [address] Phone: [phone] Fax: [phone]  [facility] [address] [place], [place] [address] Phone: [phone] Fax: [phone]     Social Determinants of Health (SDOH) Social History: [place]: No Food Insecurity (07/04/2022)  Housing: Low Risk  (07/04/2022)  Transportation Needs: No Transportation Needs (07/04/2022)  Utilities: Not At Risk (07/04/2022)  Tobacco Use: Low Risk  (07/06/2022)   SDOH Interventions:     Readmission Risk Interventions     No data to display

## 2022-07-06 NOTE — Assessment & Plan Note (Signed)
-   Continue flomax and finasteride

## 2022-07-06 NOTE — Assessment & Plan Note (Signed)
Due to instrumentation.  Will be covered by bacteremia coverage.

## 2022-07-06 NOTE — Assessment & Plan Note (Signed)
Discussed with GI, regarding timing of colonoscopy/flex sig

## 2022-07-06 NOTE — Assessment & Plan Note (Addendum)
Baseline Cr 0.9, today up to 1.7 FOley in place and [place] yesterday shows normal draiange.  LIkely hypotensive with possilbe anemia component. Unfortunately CT with contrast administered prior to my knowledge. - Avoid nephrotoxins - Strict I/Os - Gentle IVF today

## 2022-07-06 NOTE — Hospital Course (Addendum)
Steven Carter is a 77 y.o. M with hypothyroidism and recently diagnosed urinary retention who presented with pelvic pain and inability to urinate.    (Has chronic LUTS/BPH symptoms, developed urinary retention in mid-Feb requiring foley; in that context, had MRI prostate due to elevated PSA and found to have mesenteric adenopathy, plan was for colonoscopy and also prostate biopsy; while this work up was pending, had above acute pain/retention symptoms and presented)  After admission, incidentally found to have MSSA bacteremia

## 2022-07-06 NOTE — Progress Notes (Signed)
1 Day Post-Op Subjective: Pt feels well. Denies pain. Urine clear on slow drip CBI. Afebrile. BP in 110s/60s.  Objective: Vital signs in last 24 hours: Temp:  [97.4 F (36.3 C)-98 F (36.7 C)] 97.9 F (36.6 C) (03/06 0337) Pulse Rate:  [70-101] 86 (03/06 0500) Resp:  [12-24] 16 (03/06 0500) BP: (83-160)/(57-91) 110/58 (03/06 0500) SpO2:  [92 %-100 %] 95 % (03/06 0500) Weight:  [98.7 kg] 98.7 kg (03/05 2121)  Intake/Output from previous day: 03/05 0701 - 03/06 0700 In: 7571 [P.O.:240; I.V.:853.5; IV Piggyback:1077.5] Out: 21250 [Urine:21250] Intake/Output this shift: Total I/O In: 7331 [I.V.:853.5; Other:5400; IV Piggyback:1077.5] Out: [id number] [Urine:7450]  Physical Exam:  General: Alert and oriented CV: RRR Lungs: Clear Abdomen: Soft, ND, NT Ext: NT, No erythema  Lab Results: Recent Labs    07/05/22 1829 07/05/22 2038 07/06/22 0249  HGB 10.2* 8.2* 7.7*  HCT 30.0* 25.5* 23.7*   BMET Recent Labs    07/05/22 0518 07/05/22 1829 07/06/22 0249  NA 136 135 135  K 4.2 4.7 4.7  CL 105  --  104  CO2 24  --  23  GLUCOSE 112*  --  156*  BUN 14  --  25*  CREATININE 0.94  --  1.68*  CALCIUM 8.3*  --  8.5*     Studies/Results: US RENAL  Result Date: 07/05/2022 CLINICAL DATA:  Gross hematuria EXAM: RENAL / URINARY TRACT ULTRASOUND COMPLETE COMPARISON:  CT abdomen 09/09/2010 FINDINGS: Right Kidney: Renal measurements: 11.2 by 6.2 by 4.8 cm = volume: 176 mL. Echogenicity within normal limits. No mass or hydronephrosis visualized. Left Kidney: Renal measurements: 11.5 by 7.0 by 4.7 cm = volume: 199 mL. Echogenicity within normal limits. No mass or hydronephrosis visualized. Bladder: Foley catheter noted in collapsed urinary bladder, heterogeneous masslike appearance around the catheter could be from clot, tumor, or extensive wall thickening from neurogenic bladder. Other: Prostatomegaly suspected. IMPRESSION: 1. No hydronephrosis. 2. Foley catheter noted in collapsed urinary  bladder, heterogeneous masslike appearance around the catheter could be from intraluminal clot, tumor, or extensive wall thickening from neurogenic bladder. Cystoscopy may be indicated. 3. Prostatomegaly. Electronically Signed   By: Van Clines M.D.   On: 07/05/2022 18:04   ECHOCARDIOGRAM COMPLETE  Result Date: 07/04/2022    ECHOCARDIOGRAM REPORT   Patient Name:   Steven Carter Date of Exam: 07/04/2022 Medical Rec #:  [id number]       Height:       73.0 in Accession #:    [id number]      Weight:       219.8 lb Date of Birth:  01-11-1946       BSA:          2.240 m Patient Age:    77 years        BP:           159/88 mmHg Patient Gender: M               HR:           73 bpm. Exam Location:  Inpatient Procedure: 2D Echo, Cardiac Doppler and Color Doppler                                MODIFIED REPORT:      This report was modified by Dorris Carnes MD on 07/04/2022 due to Complete  impression.  Indications:     bacteremia  History:         Patient has no prior history of Echocardiogram examinations.  Sonographer:     Phineas Douglas Referring Phys:  [place] Diagnosing Phys: Dorris Carnes MD IMPRESSIONS  1. No obvious vegetation though cannot completely exclude REcomm TEE if clinically indicated.  2. Left ventricular ejection fraction, by estimation, is 35%. The left ventricle demonstrates global hypokinesis. There is mild concentric left ventricular hypertrophy.  3. Right ventricular systolic function is normal. The right ventricular size is normal.  4. Trivial mitral valve regurgitation.  5. The aortic valve is tricuspid. Aortic valve regurgitation is trivial. FINDINGS  Left Ventricle: Left ventricular ejection fraction, by estimation, is 35%. The left ventricle demonstrates global hypokinesis. The left ventricular internal cavity size was normal in size. There is mild concentric left ventricular hypertrophy. Right Ventricle: The right ventricular size is normal. Right  vetricular wall thickness was not assessed. Right ventricular systolic function is normal. Left Atrium: Left atrial size was normal in size. Right Atrium: Right atrial size was normal in size. Pericardium: There is no evidence of pericardial effusion. Mitral Valve: There is mild thickening of the mitral valve leaflet(s). Trivial mitral valve regurgitation. Tricuspid Valve: The tricuspid valve is normal in structure. Tricuspid valve regurgitation is trivial. Aortic Valve: The aortic valve is tricuspid. Aortic valve regurgitation is trivial. Aortic regurgitation PHT measures 495 msec. Pulmonic Valve: The pulmonic valve was not well visualized. Pulmonic valve regurgitation is not visualized. No evidence of pulmonic stenosis. Aorta: The aortic root and ascending aorta are structurally normal, with no evidence of dilitation. IAS/Shunts: No atrial level shunt detected by color flow Doppler.  LEFT VENTRICLE PLAX 2D LVIDd:         5.30 cm      Diastology LVIDs:         3.90 cm      LV e' medial:    5.77 cm/s LV PW:         1.20 cm      LV E/e' medial:  14.9 LV IVS:        1.30 cm      LV e' lateral:   9.90 cm/s LVOT diam:     2.00 cm      LV E/e' lateral: 8.7 LV SV:         66 LV SV Index:   29 LVOT Area:     3.14 cm  LV Volumes (MOD) LV vol d, MOD A2C: 139.0 ml LV vol d, MOD A4C: 166.0 ml LV vol s, MOD A2C: 75.0 ml LV vol s, MOD A4C: 90.6 ml LV SV MOD A2C:     64.0 ml LV SV MOD A4C:     166.0 ml LV SV MOD BP:      75.8 ml RIGHT VENTRICLE             IVC RV Basal diam:  4.00 cm     IVC diam: 1.70 cm RV S prime:     13.10 cm/s TAPSE (M-mode): 2.0 cm LEFT ATRIUM             Index        RIGHT ATRIUM           Index LA diam:        3.50 cm 1.56 cm/m   RA Area:     19.70 cm LA Vol (A2C):   68.4 ml 30.54 ml/m  RA Volume:  58.60 ml  26.16 ml/m LA Vol (A4C):   46.1 ml 20.58 ml/m LA Biplane Vol: 60.5 ml 27.01 ml/m  AORTIC VALVE LVOT Vmax:   101.00 cm/s LVOT Vmean:  71.500 cm/s LVOT VTI:    0.210 m AI PHT:      495 msec   AORTA Ao Root diam: 3.10 cm Ao Asc diam:  3.70 cm MITRAL VALVE MV Area (PHT): 4.39 cm    SHUNTS MV Decel Time: 173 msec    Systemic VTI:  0.21 m MV E velocity: 85.70 cm/s  Systemic Diam: 2.00 cm MV A velocity: 95.10 cm/s MV E/A ratio:  0.90 Dorris Carnes MD Electronically signed by Dorris Carnes MD Signature Date/Time: 07/04/2022/6:26:22 PM    Final (Updated)     Assessment/Plan: [place] hematuria: S/p cysto/clot evac/fulguration on 07/05/2022. Identified trauma in prostatic urethra and right bladder neck, also blood effluxing from RUO. Renal u/s 3/5 with no upper tract abnormality. CTU pending. UTI: Ucx 07/02/2022 >100K staph aureus Elevated PSA/PIRADS 4 prostate lesion on MRI 06/2022. Was scheduled for MRI fusion biopsy this week however this is postponed due to admission with UTI. BPH/retention: Developed urinary retention in 06/2022 requiring foley placement outpatient  -Urine is clear on CBI. Wean. Titrate to clear or light pink. Hand irrigate as needed for clots or decreased drainage. Leave foley to gravity. -CT Urogram pending. Discussed finding of bloody efflux of urine from right UO. Renal ultrasound yesterday unremarkable for upper tract disease -Continue ancef -Will delay MRI fusion biopsy until he recovers from infection -He is undergoing outpatient evaluation with GI and gen surgery to work up his perirectal lymph nodes -Following   LOS: 3 days   Matt R.  MD 07/06/2022, 6:36 AM [facility]  Pager: [phone]

## 2022-07-06 NOTE — Assessment & Plan Note (Signed)
Due to hematuria Hgb down to 7 today - Transfuse 1u given renal dysfunction

## 2022-07-06 NOTE — Assessment & Plan Note (Addendum)
New diagnosis.  Acute CHF ruled out. Has reduced EF, no active symptoms. Did not tolerate addition of GDMT - Hold Entresto - Follow up with Cardiology after discharge, defer ischemic work up to after resolution of current illness

## 2022-07-06 NOTE — Progress Notes (Addendum)
Rounding Note    Patient Name: Steven Carter Date of Encounter: 07/06/2022  [place] Cardiologist: Berniece Salines, DO   Subjective   Yesterday, patient continued to have gross hematuria and developed pelvic pain. Eventually developed weakness. He was seen by urology and was taken to the OR that evening for cystourethroscopy and clot evacuation with fulguration. Found to have bleeding at a small area of the left bladder neck and left lateral lobe of prostate and was treated with electrocautery. He required neosynephrine during the procedure, was weaned in the PACU. However, continued to have hypotension and was resumed on neosynephrine. Weaned off pressors yesterday at 2220.   Today, patient reports feeling well. No chest pain, palpitations, shortness of breath, ankle edema, or abdominal distention   Inpatient Medications    Scheduled Meds:  sodium chloride   Intravenous Once   Chlorhexidine Gluconate Cloth  6 each Topical Daily   docusate sodium  100 mg Oral BID   finasteride  5 mg Oral BID   levothyroxine  112 mcg Oral Daily   oxybutynin  5 mg Oral Daily   tamsulosin  0.4 mg Oral BID   Continuous Infusions:  sodium chloride 20 mL/hr at 07/06/22 0400   sodium chloride Stopped (07/05/22 2210)    ceFAZolin (ANCEF) IV Stopped (07/06/22 0112)   sodium chloride irrigation     PRN Meds: sodium chloride, acetaminophen **OR** acetaminophen, hydrALAZINE, HYDROmorphone (DILAUDID) injection, ondansetron **OR** ondansetron (ZOFRAN) IV, mouth rinse, oxybutynin, polyethylene glycol   Vital Signs    Vitals:   07/06/22 0300 07/06/22 0337 07/06/22 0400 07/06/22 0500  BP: 124/71  119/68 (!) 110/58  Pulse: 86  79 86  Resp: '17  19 16  '$ Temp:  97.9 F (36.6 C)    TempSrc:  Oral    SpO2: 100%  100% 95%  Weight:      Height:        Intake/Output Summary (Last 24 hours) at 07/06/2022 0801 Last data filed at 07/06/2022 0732 Gross per 24 hour  Intake 7571.03 ml  Output 21425 ml   Net -13853.97 ml      07/05/2022    9:21 PM 07/03/2022    5:19 AM  Last 3 Weights  Weight (lbs) 217 lb 9.5 oz 219 lb 12.8 oz  Weight (kg) 98.7 kg 99.701 kg      Telemetry    Normal sinus rhythm, HR in the 60s-80s - Personally Reviewed  ECG     No new tracings - Personally Reviewed  Physical Exam   GEN: No acute distress.  Sitting upright in the bed  Neck: No JVD Cardiac: RRR, no murmurs, rubs, or gallops. Radial pulses 2+ bilaterally  Respiratory: Fine crackles in bilateral lung bases, otherwise clear to auscultation bilaterally. Normal work of breathing on room air  GI: Soft, nontender, non-distended  MS: No edema in BLE; No deformity. Neuro:  Nonfocal  Psych: Normal affect   Labs    High Sensitivity Troponin:  No results for input(s): "TROPONINIHS" in the last 720 hours.   Chemistry Recent Labs  Lab 07/04/22 0448 07/05/22 0518 07/05/22 1829 07/06/22 0249  NA 136 136 135 135  K 3.9 4.2 4.7 4.7  CL 106 105  --  104  CO2 22 24  --  23  GLUCOSE 110* 112*  --  156*  BUN 14 14  --  25*  CREATININE 0.98 0.94  --  1.68*  CALCIUM 8.0* 8.3*  --  8.5*  MG 1.9 2.0  --  2.1  PROT  --   --   --  6.1*  ALBUMIN  --   --   --  3.4*  AST  --   --   --  21  ALT  --   --   --  14  ALKPHOS  --   --   --  39  BILITOT  --   --   --  0.4  GFRNONAA >60 >60  --  42*  ANIONGAP 8 7  --  8    Lipids  Recent Labs  Lab 07/06/22 0249  CHOL 85  TRIG 59  HDL 18*  LDLCALC 55  CHOLHDL 4.7    Hematology Recent Labs  Lab 07/05/22 1737 07/05/22 1829 07/05/22 2038 07/06/22 0249  WBC 9.0  --  8.8 9.2  RBC 3.19*  --  2.67* 2.49*  HGB 9.7* 10.2* 8.2* 7.7*  HCT 30.6* 30.0* 25.5* 23.7*  MCV 95.9  --  95.5 95.2  MCH 30.4  --  30.7 30.9  MCHC 31.7  --  32.2 32.5  RDW 14.3  --  14.3 14.4  PLT 148*  --  151 142*   Thyroid No results for input(s): "TSH", "FREET4" in the last 168 hours.  BNPNo results for input(s): "BNP", "PROBNP" in the last 168 hours.  DDimer No results for  input(s): "DDIMER" in the last 168 hours.   Radiology    US RENAL  Result Date: 07/05/2022 CLINICAL DATA:  Gross hematuria EXAM: RENAL / URINARY TRACT ULTRASOUND COMPLETE COMPARISON:  CT abdomen 09/09/2010 FINDINGS: Right Kidney: Renal measurements: 11.2 by 6.2 by 4.8 cm = volume: 176 mL. Echogenicity within normal limits. No mass or hydronephrosis visualized. Left Kidney: Renal measurements: 11.5 by 7.0 by 4.7 cm = volume: 199 mL. Echogenicity within normal limits. No mass or hydronephrosis visualized. Bladder: Foley catheter noted in collapsed urinary bladder, heterogeneous masslike appearance around the catheter could be from clot, tumor, or extensive wall thickening from neurogenic bladder. Other: Prostatomegaly suspected. IMPRESSION: 1. No hydronephrosis. 2. Foley catheter noted in collapsed urinary bladder, heterogeneous masslike appearance around the catheter could be from intraluminal clot, tumor, or extensive wall thickening from neurogenic bladder. Cystoscopy may be indicated. 3. Prostatomegaly. Electronically Signed   By: Van Clines M.D.   On: 07/05/2022 18:04   ECHOCARDIOGRAM COMPLETE  Result Date: 07/04/2022    ECHOCARDIOGRAM REPORT   Patient Name:   [name] Date of Exam: 07/04/2022 Medical Rec #:  [id number]       Height:       73.0 in Accession #:    [id number]      Weight:       219.8 lb Date of Birth:  [birth year]       BSA:          2.240 m Patient Age:    77 years        BP:           159/88 mmHg Patient Gender: M               HR:           73 bpm. Exam Location:  Inpatient Procedure: 2D Echo, Cardiac Doppler and Color Doppler                                MODIFIED REPORT:      This report was modified by Dorris Carnes MD on 07/04/2022  due to Complete                                   impression.  Indications:     bacteremia  History:         Patient has no prior history of Echocardiogram examinations.  Sonographer:     Phineas Douglas Referring Phys:  [place] Diagnosing  Phys: Dorris Carnes MD IMPRESSIONS  1. No obvious vegetation though cannot completely exclude REcomm TEE if clinically indicated.  2. Left ventricular ejection fraction, by estimation, is 35%. The left ventricle demonstrates global hypokinesis. There is mild concentric left ventricular hypertrophy.  3. Right ventricular systolic function is normal. The right ventricular size is normal.  4. Trivial mitral valve regurgitation.  5. The aortic valve is tricuspid. Aortic valve regurgitation is trivial. FINDINGS  Left Ventricle: Left ventricular ejection fraction, by estimation, is 35%. The left ventricle demonstrates global hypokinesis. The left ventricular internal cavity size was normal in size. There is mild concentric left ventricular hypertrophy. Right Ventricle: The right ventricular size is normal. Right vetricular wall thickness was not assessed. Right ventricular systolic function is normal. Left Atrium: Left atrial size was normal in size. Right Atrium: Right atrial size was normal in size. Pericardium: There is no evidence of pericardial effusion. Mitral Valve: There is mild thickening of the mitral valve leaflet(s). Trivial mitral valve regurgitation. Tricuspid Valve: The tricuspid valve is normal in structure. Tricuspid valve regurgitation is trivial. Aortic Valve: The aortic valve is tricuspid. Aortic valve regurgitation is trivial. Aortic regurgitation PHT measures 495 msec. Pulmonic Valve: The pulmonic valve was not well visualized. Pulmonic valve regurgitation is not visualized. No evidence of pulmonic stenosis. Aorta: The aortic root and ascending aorta are structurally normal, with no evidence of dilitation. IAS/Shunts: No atrial level shunt detected by color flow Doppler.  LEFT VENTRICLE PLAX 2D LVIDd:         5.30 cm      Diastology LVIDs:         3.90 cm      LV e' medial:    5.77 cm/s LV PW:         1.20 cm      LV E/e' medial:  14.9 LV IVS:        1.30 cm      LV e' lateral:   9.90 cm/s LVOT diam:      2.00 cm      LV E/e' lateral: 8.7 LV SV:         66 LV SV Index:   29 LVOT Area:     3.14 cm  LV Volumes (MOD) LV vol d, MOD A2C: 139.0 ml LV vol d, MOD A4C: 166.0 ml LV vol s, MOD A2C: 75.0 ml LV vol s, MOD A4C: 90.6 ml LV SV MOD A2C:     64.0 ml LV SV MOD A4C:     166.0 ml LV SV MOD BP:      75.8 ml RIGHT VENTRICLE             IVC RV Basal diam:  4.00 cm     IVC diam: 1.70 cm RV S prime:     13.10 cm/s TAPSE (M-mode): 2.0 cm LEFT ATRIUM             Index        RIGHT ATRIUM  Index LA diam:        3.50 cm 1.56 cm/m   RA Area:     19.70 cm LA Vol (A2C):   68.4 ml 30.54 ml/m  RA Volume:   58.60 ml  26.16 ml/m LA Vol (A4C):   46.1 ml 20.58 ml/m LA Biplane Vol: 60.5 ml 27.01 ml/m  AORTIC VALVE LVOT Vmax:   101.00 cm/s LVOT Vmean:  71.500 cm/s LVOT VTI:    0.210 m AI PHT:      495 msec  AORTA Ao Root diam: 3.10 cm Ao Asc diam:  3.70 cm MITRAL VALVE MV Area (PHT): 4.39 cm    SHUNTS MV Decel Time: 173 msec    Systemic VTI:  0.21 m MV E velocity: 85.70 cm/s  Systemic Diam: 2.00 cm MV A velocity: 95.10 cm/s MV E/A ratio:  0.90 Dorris Carnes MD Electronically signed by Dorris Carnes MD Signature Date/Time: 07/04/2022/6:26:22 PM    Final (Updated)     Cardiac Studies   Echocardiogram 07/04/22   1. No obvious vegetation though cannot completely exclude Recommend TEE if  clinically indicated.   2. Left ventricular ejection fraction, by estimation, is 35%. The left  ventricle demonstrates global hypokinesis. There is mild concentric left  ventricular hypertrophy.   3. Right ventricular systolic function is normal. The right ventricular  size is normal.   4. Trivial mitral valve regurgitation.   5. The aortic valve is tricuspid. Aortic valve regurgitation is trivial.   Patient Profile     77 y.o. male with a hx of BPH, hypothyroidism who is being seen for the evaluation of reduced EF  Assessment & Plan    Acute Systolic Heart Failure  - Patient presented on 3/2 complaining of clogged foley, gross  hematuria, pelvic pain. Found to have staph UTI and MSSA bacteremia. Now on IV antibiotics.  - Echocardiogram on 3/4 showed EF 35%, no obvious vegetation  -Patient denies history of hypertension, hyperlipidemia, diabetes, family history of coronary artery disease.  He smoked a little bit in his 83s, but has not smoked cigarettes since. LDL 55 this admission  - Patient denies chest pain, shortness of breath.  - Given limited risk factors for coronary artery disease and lack of symptoms, will start GDMT and reassess EF in 3 months. Can consider ischemic evaluation at that time - Would not pursue ischemic evaluation at this time as patient has gross hematuria and is pending prostate biopsy  - Started entresto 24-26 mg BID and metoprolol succinate 25 mg daily yesterday. However, patient became hypotensive and required pressor support after his urologic surgery yesterday - OK to hold entresto and metoprolol until BP improves  - Hold off on SGLT2i given UTI   Bacteremia  - ID recommending TEE, scheduled for tomorrow  - See consult note from 3/5 for consent  For questions or updates, please contact [place] Please consult [email or web address] for contact info under  Signed, Margie Billet, PA-C  07/06/2022, 8:01 AM    Patient seen and examined, note reviewed with the signed Advanced Practice Provider. I personally reviewed laboratory data, imaging studies and relevant notes. I independently examined the patient and formulated the important aspects of the plan. I have personally discussed the plan with the patient and/or family. Comments or changes to the note/plan are indicated below.  Sitting up in the chair during my encounter. Recent diagnosis of heart failure with depressed ef - plan to use medical therapy for now given current clinical picture with hematuria/pending  prostate biopsy and bacteremia. No further ischemic eval in this inpatient setting. Thankfully he has no angina symptoms.    His GDMT hads been held due to hypotension - plan to reinitiate once blood pressure improves.  He is anemic 7.7- acute blood loss anemia, he is not symptomatic. FU hgb. If drop may need PRBC transfusion   TEE tomorrow - he is aware Shared Decision Making/Informed Consent The risks [esophageal damage, perforation (1:10,000 risk), bleeding, pharyngeal hematoma as well as other potential complications associated with conscious sedation including aspiration, arrhythmia, respiratory failure and death], benefits (treatment guidance and diagnostic support) and alternatives of a transesophageal echocardiogram were discussed in detail with Mr. Summerson and he is willing to proceed.    Berniece Salines DO, MS [facility] Attending Cardiologist [place]  [address] [place],  [address] [phone] Website: [email or web address]

## 2022-07-06 NOTE — Assessment & Plan Note (Signed)
Continue levothyroxine 

## 2022-07-06 NOTE — Assessment & Plan Note (Signed)
No current satellites of infection (other than urine). Repeat cultures from 3/4 no growth - Continue cefazolin - Consult ID, appreciate cares - Plan for TEE today

## 2022-07-06 NOTE — Progress Notes (Signed)
  Progress Note   Patient: Steven Carter [id number] DOB: 11/28/1945 DOA: 07/02/2022     3 DOS: the patient was seen and examined on 07/06/2022 at 9:07AM      Brief hospital course: Steven Carter is a 77 y.o. M with hypothyroidism and recently diagnosed urinary retention who presented with pelvic pain and inability to urinate.    (Has chronic LUTS/BPH symptoms, developed urinary retention in mid-Feb requiring foley; in that context, had MRI prostate due to elevated PSA and found to have mesenteric adenopathy, plan was for colonoscopy and also prostate biopsy; while this work up was pending, had above acute pain/retention symptoms and presented)  After admission, incidentally found to have MSSA bacteremia      Assessment and Plan: * MSSA bacteremia No current satellites of infection (other than urine). Repeat cultures from 3/3 ngtd - Continue cefazolin - Consult ID, appreciate cares - Plan for TEE tomorrow    AKI (acute kidney injury) ([name]) Baseline Cr 0.9, today up to 1.7 FOley in place and [place] yesterday shows normal draiange.  LIkely hypotensive with possilbe anemia component. Unfortunately CT with contrast administered prior to my knowledge. - Avoid nephrotoxins - Strict I/Os - Gentle IVF today  UTI (urinary tract infection) Due to instrumentation.  Will be covered by bacteremia coverage.  Acute blood loss anemia Due to hematuria - Trend Hgb - Transfusion threshold 7 g/dL  Gross hematuria S/p Cystourethroscopy, Clot Evacuation with Fulguration on 3/5 by Dr. [name] [facility] noted trauma in prostatic urethra and right bladder neck, also blood effluxing from [place] on cysto Follow up US renal negative, follow up CT today showed mild right ureteral hydro and debris - Continue CBI - Consult Urology, appreciate cares   Mesenteric lymphadenopathy Discussed with GI, regarding timing of colonoscopy/flex sig  Chronic systolic CHF (congestive heart failure) ([name]) New diagnosis.   Acute CHF ruled out. Has reduced EF, no active symptoms. Did not tolerate addition of GDMT - Hold Entresto - Follow up with Cardiology after discharge, defer ischemic work up to after resolution of current illness  Hypothyroidism - Continue levothyroxine  Benign prostatic hyperplasia - Continue flomax and finasteride          Subjective: Patient's urine is cleared up. He's had no dizziness, no confusion.  He had cystoscopy yesterday.  Planning for TEE tomorrow.  No syncope, confusion, chest pain, dyspnea.     Physical Exam: BP (!) 126/45   Pulse 86   Temp (!) 97.5 F (36.4 C) (Oral)   Resp (!) 23   Ht '6\' 1"'$  (1.854 m)   Wt 98.7 kg   SpO2 99%   BMI 28.71 kg/m   Pale adult male, sitting up in bed, interactive and appropriate Slightly tachycardic, regular, no murmurs, no peripheral edema Respiratory rate normal, lungs clear without rales or wheezes Abdomen soft, no grimace to palpation, no distention Attention normal, affect appropriate, judgment and insight appear normal, moves upper extremities with generalized weakness but symmetric strength, speech fluent, face symmetric    Data Reviewed: Discussed with gastroenterology Creatinine up to 1.7 Lipids normal Hemoglobin 8.2 overnight down to 7.7 today CT abdomen and pelvis reviewed, ultrasound renal reviewed, summarized above     Family Communication: Wife at the bedside    Disposition: Status is: Inpatient Home after TEE, finalization of antibiotics course, stabilization of Hgb        Author: Edwin Dada, MD 07/06/2022 3:20 PM  For on call review [email or web address].

## 2022-07-07 ENCOUNTER — Other Ambulatory Visit (HOSPITAL_COMMUNITY): Payer: Self-pay

## 2022-07-07 ENCOUNTER — Inpatient Hospital Stay (HOSPITAL_COMMUNITY): Payer: Medicare Other

## 2022-07-07 ENCOUNTER — Inpatient Hospital Stay (HOSPITAL_COMMUNITY): Payer: Medicare Other | Admitting: Anesthesiology

## 2022-07-07 ENCOUNTER — Encounter (HOSPITAL_COMMUNITY)
Admission: EM | Disposition: A | Discharge status: Home Health | Payer: Self-pay | Source: Home / Self Care | Attending: Family Medicine

## 2022-07-07 ENCOUNTER — Encounter (HOSPITAL_COMMUNITY): Payer: Self-pay | Admitting: Internal Medicine

## 2022-07-07 DIAGNOSIS — I509 Heart failure, unspecified: Secondary | ICD-10-CM | POA: Diagnosis not present

## 2022-07-07 DIAGNOSIS — I351 Nonrheumatic aortic (valve) insufficiency: Secondary | ICD-10-CM | POA: Diagnosis not present

## 2022-07-07 DIAGNOSIS — D638 Anemia in other chronic diseases classified elsewhere: Secondary | ICD-10-CM

## 2022-07-07 DIAGNOSIS — I110 Hypertensive heart disease with heart failure: Secondary | ICD-10-CM

## 2022-07-07 DIAGNOSIS — R935 Abnormal findings on diagnostic imaging of other abdominal regions, including retroperitoneum: Secondary | ICD-10-CM

## 2022-07-07 DIAGNOSIS — R7881 Bacteremia: Secondary | ICD-10-CM

## 2022-07-07 DIAGNOSIS — N179 Acute kidney failure, unspecified: Secondary | ICD-10-CM | POA: Diagnosis not present

## 2022-07-07 DIAGNOSIS — D62 Acute posthemorrhagic anemia: Secondary | ICD-10-CM | POA: Diagnosis not present

## 2022-07-07 DIAGNOSIS — I255 Ischemic cardiomyopathy: Secondary | ICD-10-CM

## 2022-07-07 DIAGNOSIS — E039 Hypothyroidism, unspecified: Secondary | ICD-10-CM

## 2022-07-07 DIAGNOSIS — B9561 Methicillin susceptible Staphylococcus aureus infection as the cause of diseases classified elsewhere: Secondary | ICD-10-CM | POA: Diagnosis not present

## 2022-07-07 DIAGNOSIS — K5900 Constipation, unspecified: Secondary | ICD-10-CM

## 2022-07-07 DIAGNOSIS — N3001 Acute cystitis with hematuria: Secondary | ICD-10-CM | POA: Diagnosis not present

## 2022-07-07 HISTORY — PX: TEE WITHOUT CARDIOVERSION: SHX5443

## 2022-07-07 LAB — COMPREHENSIVE METABOLIC PANEL
ALT: 8 U/L (ref 0–44)
AST: 19 U/L (ref 15–41)
Albumin: 3.3 g/dL — ABNORMAL LOW (ref 3.5–5.0)
Alkaline Phosphatase: 35 U/L — ABNORMAL LOW (ref 38–126)
Anion gap: 8 (ref 5–15)
BUN: 29 mg/dL — ABNORMAL HIGH (ref 8–23)
CO2: 21 mmol/L — ABNORMAL LOW (ref 22–32)
Calcium: 8.1 mg/dL — ABNORMAL LOW (ref 8.9–10.3)
Chloride: 105 mmol/L (ref 98–111)
Creatinine, Ser: 1.93 mg/dL — ABNORMAL HIGH (ref 0.61–1.24)
GFR, Estimated: 36 mL/min — ABNORMAL LOW (ref >60)
Glucose, Bld: 108 mg/dL — ABNORMAL HIGH (ref 70–99)
Potassium: 3.9 mmol/L (ref 3.5–5.1)
Sodium: 134 mmol/L — ABNORMAL LOW (ref 135–145)
Total Bilirubin: 0.6 mg/dL (ref 0.3–1.2)
Total Protein: 5.8 g/dL — ABNORMAL LOW (ref 6.5–8.1)

## 2022-07-07 LAB — CBC
HCT: 23.6 % — ABNORMAL LOW (ref 39.0–52.0)
Hemoglobin: 7 g/dL — ABNORMAL LOW (ref 13.0–17.0)
MCH: 30.2 pg (ref 26.0–34.0)
MCHC: 29.7 g/dL — ABNORMAL LOW (ref 30.0–36.0)
MCV: 101.7 fL — ABNORMAL HIGH (ref 80.0–100.0)
Platelets: 146 10*3/uL — ABNORMAL LOW (ref 150–400)
RBC: 2.32 MIL/uL — ABNORMAL LOW (ref 4.22–5.81)
RDW: 14.7 % (ref 11.5–15.5)
WBC: 9.4 10*3/uL (ref 4.0–10.5)
nRBC: 0 % (ref 0.0–0.2)

## 2022-07-07 LAB — PREPARE RBC (CROSSMATCH)

## 2022-07-07 LAB — HEMOGLOBIN AND HEMATOCRIT, BLOOD
HCT: 25.2 % — ABNORMAL LOW (ref 39.0–52.0)
Hemoglobin: 8.1 g/dL — ABNORMAL LOW (ref 13.0–17.0)

## 2022-07-07 LAB — ECHO TEE: Est EF: 40

## 2022-07-07 SURGERY — ECHOCARDIOGRAM, TRANSESOPHAGEAL
Anesthesia: Monitor Anesthesia Care

## 2022-07-07 MED ORDER — SODIUM CHLORIDE 0.9 % IR SOLN: 500.0000 mL | Freq: Once | Status: DC

## 2022-07-07 MED ORDER — CHLORHEXIDINE GLUCONATE CLOTH 2 % EX PADS
6.0000 | MEDICATED_PAD | Freq: Every day | CUTANEOUS | Status: DC
  Administered 2022-07-07 – 2022-07-09 (×3): 6 via TOPICAL

## 2022-07-07 MED ORDER — PROPOFOL 500 MG/50ML IV EMUL
INTRAVENOUS | Status: DC | PRN
  Administered 2022-07-07: 100 ug/kg/min via INTRAVENOUS

## 2022-07-07 MED ORDER — POLYETHYLENE GLYCOL 3350 17 G PO PACK
17.0000 g | PACK | Freq: Every day | ORAL | Status: DC
  Administered 2022-07-08 – 2022-07-10 (×3): 17 g via ORAL
  Filled 2022-07-07 (×3): qty 1

## 2022-07-07 MED ORDER — PROPOFOL 10 MG/ML IV BOLUS
INTRAVENOUS | Status: DC | PRN
  Administered 2022-07-07: 50 mg via INTRAVENOUS

## 2022-07-07 MED ORDER — SODIUM CHLORIDE 0.9% IV SOLUTION: Freq: Once | INTRAVENOUS | Status: AC

## 2022-07-07 NOTE — Transfer of Care (Signed)
Immediate Anesthesia Transfer of Care Note  Patient: Steven Carter  Procedure(s) Performed: TRANSESOPHAGEAL ECHOCARDIOGRAM (TEE)  Patient Location: Endoscopy Unit  Anesthesia Type:MAC  Level of Consciousness: drowsy and patient cooperative  Airway & Oxygen Therapy: Patient Spontanous Breathing  Post-op Assessment: Report given to RN, Post -op Vital signs reviewed and stable, and Patient moving all extremities X 4  Post vital signs: Reviewed and stable  Last Vitals:  Vitals Value Taken Time  BP    Temp    Pulse    Resp    SpO2      Last Pain:  Vitals:   07/07/22 1303  TempSrc: Temporal  PainSc: 0-No pain      Patients Stated Pain Goal: 0 ([phone] [id number])  Complications: No notable events documented.

## 2022-07-07 NOTE — Anesthesia Procedure Notes (Signed)
Procedure Name: MAC Date/Time: 07/07/2022 1:29 PM  Performed by: Darletta Moll, CRNAPre-anesthesia Checklist: Patient identified, Emergency Drugs available, Suction available and Patient being monitored Patient Re-evaluated:Patient Re-evaluated prior to induction Oxygen Delivery Method: Nasal cannula

## 2022-07-07 NOTE — H&P (View-Only) (Signed)
Progress Note  Patient Name: Steven Carter Date of Encounter: 07/07/2022  Primary Cardiologist: Berniece Salines, DO   Subjective   Patient seen examined his bedside.  Aware of TEE today.   Inpatient Medications    Scheduled Meds:  sodium chloride   Intravenous Once   Chlorhexidine Gluconate Cloth  6 each Topical QHS   docusate sodium  100 mg Oral BID   finasteride  5 mg Oral BID   levothyroxine  112 mcg Oral Daily   oxybutynin  5 mg Oral Daily   tamsulosin  0.4 mg Oral BID   Continuous Infusions:  sodium chloride 20 mL/hr at 07/07/22 0849   sodium chloride Stopped (07/05/22 2210)    ceFAZolin (ANCEF) IV Stopped (07/07/22 0820)   sodium chloride irrigation     PRN Meds: sodium chloride, acetaminophen **OR** acetaminophen, hydrALAZINE, HYDROmorphone (DILAUDID) injection, ondansetron **OR** ondansetron (ZOFRAN) IV, mouth rinse, oxybutynin, polyethylene glycol   Vital Signs    Vitals:   07/07/22 0800 07/07/22 0817 07/07/22 0930 07/07/22 0949  BP:   133/65 (!) 148/65  Pulse: 81  87 84  Resp: 18  (!) 21 19  Temp:  98.2 F (36.8 C) 98.1 F (36.7 C) 97.8 F (36.6 C)  TempSrc:  Oral Oral Oral  SpO2: 96%  100% 100%  Weight:      Height:        Intake/Output Summary (Last 24 hours) at 07/07/2022 1137 Last data filed at 07/07/2022 0849 Gross per 24 hour  Intake 7197.68 ml  Output 5875 ml  Net 1322.68 ml   Filed Weights   07/03/22 0519 07/05/22 2121  Weight: 99.7 kg 98.7 kg    Telemetry    Sinus rhythm- Personally Reviewed  ECG    None today - Personally Reviewed  Physical Exam     General: Comfortable, sitting up in a chair Head: Atraumatic, normal size  Eyes: PEERLA, EOMI  Neck: Supple, normal JVD Cardiac: Normal S1, S2; RRR; no murmurs, rubs, or gallops Lungs: Clear to auscultation bilaterally Abd: Soft, nontender, no hepatomegaly  Ext: warm, no edema Musculoskeletal: No deformities, BUE and BLE strength normal and equal Skin: Warm and dry, no rashes    Neuro: Alert and oriented to person, place, time, and situation, CNII-XII grossly intact, no focal deficits  Psych: Normal mood and affect   Labs    Chemistry Recent Labs  Lab 07/05/22 0518 07/05/22 1829 07/06/22 0249 07/07/22 0307  NA 136 135 135 134*  K 4.2 4.7 4.7 3.9  CL 105  --  104 105  CO2 24  --  23 21*  GLUCOSE 112*  --  156* 108*  BUN 14  --  25* 29*  CREATININE 0.94  --  1.68* 1.93*  CALCIUM 8.3*  --  8.5* 8.1*  PROT  --   --  6.1* 5.8*  ALBUMIN  --   --  3.4* 3.3*  AST  --   --  21 19  ALT  --   --  14 8  ALKPHOS  --   --  39 35*  BILITOT  --   --  0.4 0.6  GFRNONAA >60  --  42* 36*  ANIONGAP 7  --  8 8     Hematology Recent Labs  Lab 07/05/22 2038 07/06/22 0249 07/06/22 1544 07/07/22 0307  WBC 8.8 9.2  --  9.4  RBC 2.67* 2.49*  --  2.32*  HGB 8.2* 7.7* 7.7* 7.0*  HCT 25.5* 23.7* 24.4* 23.6*  MCV 95.5 95.2  --  101.7*  MCH 30.7 30.9  --  30.2  MCHC 32.2 32.5  --  29.7*  RDW 14.3 14.4  --  14.7  PLT 151 142*  --  146*    Cardiac EnzymesNo results for input(s): "TROPONINI" in the last 168 hours. No results for input(s): "TROPIPOC" in the last 168 hours.   BNPNo results for input(s): "BNP", "PROBNP" in the last 168 hours.   DDimer No results for input(s): "DDIMER" in the last 168 hours.   Radiology    CT HEMATURIA WORKUP  Result Date: 07/06/2022 CLINICAL DATA:  Gross hematuria. Status cystoscopy and clot evacuation. Fulguration. EXAM: CT ABDOMEN AND PELVIS WITHOUT AND WITH CONTRAST TECHNIQUE: Multidetector CT imaging of the abdomen and pelvis was performed following the standard protocol before and following the bolus administration of intravenous contrast. RADIATION DOSE REDUCTION: This exam was performed according to the departmental dose-optimization program which includes automated exposure control, adjustment of the mA and/or kV according to patient size and/or use of iterative reconstruction technique. CONTRAST:  9m OMNIPAQUE IOHEXOL 300  MG/ML  SOLN COMPARISON:  Prostate MRI 06/24/2022 FINDINGS: Lower chest: Lung bases are clear. Hepatobiliary: No focal hepatic lesion. Normal gallbladder. No biliary duct dilatation. Common bile duct is normal. Pancreas: Pancreas is normal. No ductal dilatation. No pancreatic inflammation. Spleen: Normal spleen Adrenals/urinary tract: Adrenal glands normal. No enhancing renal cortical lesion. Mild pelvicaliectasis and hydroureter on the RIGHT. No obstructing lesion identified. Bladder is collapsed from Foley catheter. Enlarged prostate gland again noted. Delayed imaging demonstrates contrast through the entirety of the LEFT ureter entering the bladder. No obstruction. There is delayed excretion and no significant contrast within the mildly dilated RIGHT ureter. Stomach/Bowel: The stomach, duodenum, and small bowel normal. The colon and rectosigmoid colon are normal. Vascular/Lymphatic: Abdominal aorta normal caliber. Multiple small lymph nodes in the operator space is. Small lymph node in the LEFT perirectal fat measuring 5 mm on image 83/4) Reproductive: Prostatomegaly.  Foley catheter in place. Other: No free fluid. Musculoskeletal: No aggressive osseous lesion. IMPRESSION: 1. Mild pelvicaliectasis and hydroureter on the RIGHT. No obstructing lesion identified. Potential debris within the RIGHT ureter. RIGHT ureter not opacified on postcontrast delayed imaging. 2. Bladder collapsed from Foley catheter. 3. Prostatomegaly. 4. Small lymph nodes in the operator space and LEFT perirectal fat. Electronically Signed   By: SSuzy BouchardM.D.   On: 07/06/2022 12:50   [place]RENAL  Result Date: 07/05/2022 CLINICAL DATA:  Gross hematuria EXAM: RENAL / URINARY TRACT ULTRASOUND COMPLETE COMPARISON:  CT abdomen 09/09/2010 FINDINGS: Right Kidney: Renal measurements: 11.2 by 6.2 by 4.8 cm = volume: 176 mL. Echogenicity within normal limits. No mass or hydronephrosis visualized. Left Kidney: Renal measurements: 11.5 by 7.0 by 4.7  cm = volume: 199 mL. Echogenicity within normal limits. No mass or hydronephrosis visualized. Bladder: Foley catheter noted in collapsed urinary bladder, heterogeneous masslike appearance around the catheter could be from clot, tumor, or extensive wall thickening from neurogenic bladder. Other: Prostatomegaly suspected. IMPRESSION: 1. No hydronephrosis. 2. Foley catheter noted in collapsed urinary bladder, heterogeneous masslike appearance around the catheter could be from intraluminal clot, tumor, or extensive wall thickening from neurogenic bladder. Cystoscopy may be indicated. 3. Prostatomegaly. Electronically Signed   By: WVan ClinesM.D.   On: 07/05/2022 18:04    Cardiac Studies   Echocardiogram 07/04/22   1. No obvious vegetation though cannot completely exclude Recommend TEE if  clinically indicated.   2. Left ventricular ejection fraction, by estimation,  is 35%. The left  ventricle demonstrates global hypokinesis. There is mild concentric left  ventricular hypertrophy.   3. Right ventricular systolic function is normal. The right ventricular  size is normal.   4. Trivial mitral valve regurgitation.   5. The aortic valve is tricuspid. Aortic valve regurgitation is trivial.   Patient Profile     77 y.o. 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 Recent diagnosis of heart failure with depressed ef - plan to use medical therapy for now given current clinical picture with hematuria/pending prostate biopsy and bacteremia. No further ischemic eval in this inpatient setting. Thankfully he has no angina symptoms.    His GDMT hads been held due to hypotension - plan to reinitiate once blood pressure improves.  He is planned for TEE today for his MSSA bacteremia.  He is  aware of this.  Shared Decision Making/Informed Consent The risks [esophageal damage, perforation (1:10,000 risk), bleeding, pharyngeal hematoma as well as other potential complications associated with conscious sedation including aspiration, arrhythmia, respiratory failure and death], benefits (treatment guidance and diagnostic support) and alternatives of a transesophageal echocardiogram were discussed in detail with Mr. Catino and he is willing to proceed.    For questions or updates, please contact [place] Please consult [email or web address] for contact info under Cardiology/STEMI.      Signed, Berniece Salines, DO  07/07/2022, 11:37 AM

## 2022-07-07 NOTE — Progress Notes (Signed)
Progress Note   Patient: Steven Carter [id number] DOB: 1945-09-04 DOA: 07/02/2022     4 DOS: the patient was seen and examined on 07/07/2022 at 8:13AM      Brief hospital course: Steven Carter is a 77 y.o. M with hypothyroidism and recently diagnosed urinary retention who presented with pelvic pain and inability to urinate.    (Has chronic LUTS/BPH symptoms, developed urinary retention in mid-Feb requiring foley; in that context, had MRI prostate due to elevated PSA and found to have mesenteric adenopathy, plan was for colonoscopy and also prostate biopsy; while this work up was pending, had above acute pain/retention symptoms and presented)  After admission, incidentally found to have MSSA bacteremia      Assessment and Plan: * MSSA bacteremia No current satellites of infection (other than urine). Repeat cultures from 3/4 no growth - Continue cefazolin - Consult ID, appreciate cares - Plan for TEE today    AKI (acute kidney injury) ([place]) Baseline Cr 0.9, Cr trending up to 1.9 due to anemia, hypotension.    - Give 1 unit PRBCs today - Avoid nephrotoxins - Strict I/Os    Acute blood loss anemia Due to hematuria Hgb down to 7 today - Transfuse 1u given renal dysfunction  Gross hematuria S/p Cystourethroscopy, Clot Evacuation with Fulguration on 3/5 by Dr. [place] [facility] noted trauma in prostatic urethra and right bladder neck, also blood effluxing from RUO on cysto - CBI and foley per Urology   Mesenteric lymphadenopathy - Consult GI, appreciate cares  Chronic systolic CHF (congestive heart failure) ([place]) New diagnosis.  Acute CHF ruled out. Has reduced EF, no active symptoms. Did not tolerate addition of GDMT - Hold Entresto and metoprolol - Avoid SGLT2i in setting of MSSA UTI/bacteremia - Follow up with Cardiology after discharge, defer ischemic work up to after resolution of current illness  Hypothyroidism - Continue levothyroxine  Benign prostatic  hyperplasia - Continue flomax and finasteride          Subjective: No bleeding, no confusion, no fever.  Tired.  Pale.  Got CT yesterday, plan for TEE today after transfusion.     Physical Exam: BP (!) 148/65   Pulse 84   Temp 97.8 F (36.6 C) (Oral)   Resp 19   Ht '6\' 1"'$  (1.854 m)   Wt 98.7 kg   SpO2 100%   BMI 28.71 kg/m   Adult male, sitting up in recliner, interactive and appropriate RRR, no murmurs, no peripheral edema Respiratory rate normal, lungs clear without rales or wheezes Abdomen soft without tenderness palpation or guarding Skin pale Attention normal, affect appropriate, judgment insight appear normal  Data Reviewed: Discussed with GI, urology Hemoglobin down to 7, platelets and white blood cell normal Patient metabolic panel shows creatinine up to 1.9    Family Communication: Wife by phone    Disposition: Status is: Inpatient Patient was admitted with urinary retention, found to have gross hematuria requiring cystoscopy and CBI, also found to have MSSA bacteremia  At this point, from the standpoint of his MSSA bacteremia, he will need a PICC line and either 2 or 6 weeks of IV antibiotics pending on the TEE  From the standpoint of his hematuria, he will need to come off of CBI, and have established follow-up with urology for the Foley  From the standpoint of his adenopathy, he will need to be evaluated by gastroenterology to decide if he should have colonoscopy now or in the future  Author: Edwin Dada, MD 07/07/2022 11:08 AM  For on call review [email or web address].

## 2022-07-07 NOTE — Interval H&P Note (Signed)
History and Physical Interval Note:  07/07/2022 1:10 PM  Steven Carter  has presented today for surgery, with the diagnosis of BATERIMIA.  The various methods of treatment have been discussed with the patient and family. After consideration of risks, benefits and other options for treatment, the patient has consented to  Procedure(s): TRANSESOPHAGEAL ECHOCARDIOGRAM (TEE) (N/A) as a surgical intervention.  The patient's history has been reviewed, patient examined, no change in status, stable for surgery.  I have reviewed the patient's chart and labs.  Questions were answered to the patient's satisfaction.      Harrell Gave

## 2022-07-07 NOTE — CV Procedure (Signed)
    TRANSESOPHAGEAL ECHOCARDIOGRAM   NAME:  Steven Carter   MRN: [id number] DOB:  [birth year]   ADMIT DATE: 07/02/2022  INDICATIONS: MSSA bacteremia  PROCEDURE:   Informed consent was obtained prior to the procedure. The risks, benefits and alternatives for the procedure were discussed and the patient comprehended these risks.  Risks include, but are not limited to, cough, sore throat, vomiting, nausea, somnolence, esophageal and stomach trauma or perforation, bleeding, low blood pressure, aspiration, pneumonia, infection, trauma to the teeth and death.    Procedural time out performed.   Patient received monitored anesthesia care under the supervision of Dr. Ola Spurr. Patient received a total of 218 mg propofol during the procedure.  The transesophageal probe was inserted in the esophagus and stomach without difficulty and multiple views were obtained.    COMPLICATIONS:    There were no immediate complications.  FINDINGS:  LEFT VENTRICLE: EF = 40% with global hypokinesis.  RIGHT VENTRICLE: Normal size and function.   LEFT ATRIUM: No thrombus/mass.  LEFT ATRIAL APPENDAGE: No thrombus/mass.   RIGHT ATRIUM: No thrombus/mass.  AORTIC VALVE:  Trileaflet. Mild regurgitation. No vegetation.  MITRAL VALVE:    Normal structure. Trivial regurgitation. No vegetation.  TRICUSPID VALVE: Normal structure. Trivial regurgitation. No vegetation.  PULMONIC VALVE: Grossly normal structure. Trivial regurgitation. No apparent vegetation.  INTERATRIAL SEPTUM: No PFO or ASD seen by color Doppler.  PERICARDIUM: Trivial effusion noted.  DESCENDING AORTA: Mild diffuse plaque seen   CONCLUSION: no evidence of endocarditis   Buford Dresser, MD, PhD, [facility] at [facility] at [facility] [address] [place], [place] [address] [phone]   1:40 PM

## 2022-07-07 NOTE — Progress Notes (Signed)
PHARMACY CONSULT NOTE FOR:  OUTPATIENT  PARENTERAL ANTIBIOTIC THERAPY (OPAT)  Indication: MSSA bacteremia Regimen: Cefazolin 2g IV every 8 hours End date: 07/18/22 (2 weeks from neg BCx on 07/04/22)  After completion of Cefazolin, the patient will transition to Linezolid to complete an additional 2 weeks for a total LOT of 4 weeks   IV antibiotic discharge orders are pended. To discharging provider:  please sign these orders via discharge navigator,  Select New Orders & click on the button choice - Manage This Unsigned Work.     Thank you for allowing pharmacy to be a part of this patient's care.  Alycia Rossetti, PharmD, BCPS Infectious Diseases Clinical Pharmacist 07/07/2022 4:04 PM   **Pharmacist phone directory can now be found on [email or web address] ([email or web address]).  Listed under [place].

## 2022-07-07 NOTE — TOC Benefit Eligibility Note (Signed)
Patient Teacher, English as a foreign language completed.    The patient is currently admitted and upon discharge could be taking linezolid (Zyvox) 600 mg tablets.  The current 14 day co-pay is $5.00.   The patient is insured through [employer], [place] Patient [place] Patient Advocate Team Direct Number: [phone]  Fax: [phone]

## 2022-07-07 NOTE — Anesthesia Preprocedure Evaluation (Signed)
Anesthesia Evaluation  Patient identified by MRN, date of birth, ID band Patient awake    Reviewed: Allergy & Precautions, NPO status , Patient's Chart, lab work & pertinent test results  Airway Mallampati: II  TM Distance: >3 FB     Dental  (+) Dental Advisory Given   Pulmonary neg pulmonary ROS   breath sounds clear to auscultation       Cardiovascular hypertension, +CHF   Rhythm:Regular Rate:Normal  TTE 07/04/2022  1. No obvious vegetation though cannot completely exclude REcomm TEE if clinically indicated.  2. Left ventricular ejection fraction, by estimation, is 35%. The left ventricle demonstrates global hypokinesis. There is mild concentric left ventricular hypertrophy.  3. Right ventricular systolic function is normal. The right ventricular size is normal.  4. Trivial mitral valve regurgitation.  5. The aortic valve is tricuspid. Aortic valve regurgitation is trivial    Neuro/Psych negative neurological ROS     GI/Hepatic negative GI ROS, Neg liver ROS,,,  Endo/Other  Hypothyroidism    Renal/GU Renal disease     Musculoskeletal   Abdominal   Peds  Hematology  (+) Blood dyscrasia, anemia   Anesthesia Other Findings   Reproductive/Obstetrics                              Anesthesia Physical Anesthesia Plan  ASA: 3  Anesthesia Plan: MAC   Post-op Pain Management:    Induction: Intravenous  PONV Risk Score and Plan: 2 and Dexamethasone, Ondansetron and Treatment may vary due to age or medical condition  Airway Management Planned: Natural Airway, Simple Face Mask and Mask  Additional Equipment: None  Intra-op Plan:   Post-operative Plan: Extubation in OR  Informed Consent: I have reviewed the patients History and Physical, chart, labs and discussed the procedure including the risks, benefits and alternatives for the proposed anesthesia with the patient or authorized  representative who has indicated his/her understanding and acceptance.     Dental advisory given  Plan Discussed with: Anesthesiologist and CRNA  Anesthesia Plan Comments:          Anesthesia Quick Evaluation

## 2022-07-07 NOTE — Progress Notes (Signed)
Latest Reference Range & Units Most Recent  Hemoglobin 13.0 - 17.0 g/dL 7.0 (L) 07/07/22 03:07   NP notified of HGB 7.0.  no clots noted in foley since 2000, urine now clear, yellow/amber color.  Pt asymptomatic, last b/p 151/61, HR 70s-80s.  Per MD note, threshold 7g/dL.  Per NP, hold on transfusing at this time as pt is asymptomatic.

## 2022-07-07 NOTE — Progress Notes (Signed)
2 Days Post-Op Subjective: Feels well. No complaints. CBI off and urine clear see through.  Objective: Vital signs in last 24 hours: Temp:  [97.5 F (36.4 C)-98.5 F (36.9 C)] 97.9 F (36.6 C) (03/07 1135) Pulse Rate:  [74-96] 79 (03/07 1135) Resp:  [14-25] 22 (03/07 1135) BP: (126-170)/(45-80) 151/75 (03/07 1135) SpO2:  [95 %-100 %] 100 % (03/07 1135)  Intake/Output from previous day: 03/06 0701 - 03/07 0700 In: 4220 [I.V.:920; IV Piggyback:300] Out: 5425 [Urine:5425] Intake/Output this shift: Total I/O In: 3538.1 [I.V.:103.1; Blood:337.5; Other:3000; IV Piggyback:97.5] Out: 925 [Urine:925]  Physical Exam:  General: Alert and oriented CV: RRR Lungs: Clear Abdomen: Soft, ND, NT Ext: NT, No erythema  Lab Results: Recent Labs    07/06/22 0249 07/06/22 1544 07/07/22 0307  HGB 7.7* 7.7* 7.0*  HCT 23.7* 24.4* 23.6*   BMET Recent Labs    07/06/22 0249 07/07/22 0307  NA 135 134*  K 4.7 3.9  CL 104 105  CO2 23 21*  GLUCOSE 156* 108*  BUN 25* 29*  CREATININE 1.68* 1.93*  CALCIUM 8.5* 8.1*     Studies/Results: CT HEMATURIA WORKUP  Result Date: 07/06/2022 CLINICAL DATA:  Gross hematuria. Status cystoscopy and clot evacuation. Fulguration. EXAM: CT ABDOMEN AND PELVIS WITHOUT AND WITH CONTRAST TECHNIQUE: Multidetector CT imaging of the abdomen and pelvis was performed following the standard protocol before and following the bolus administration of intravenous contrast. RADIATION DOSE REDUCTION: This exam was performed according to the departmental dose-optimization program which includes automated exposure control, adjustment of the mA and/or kV according to patient size and/or use of iterative reconstruction technique. CONTRAST:  59m OMNIPAQUE IOHEXOL 300 MG/ML  SOLN COMPARISON:  Prostate MRI 06/24/2022 FINDINGS: Lower chest: Lung bases are clear. Hepatobiliary: No focal hepatic lesion. Normal gallbladder. No biliary duct dilatation. Common bile duct is normal.  Pancreas: Pancreas is normal. No ductal dilatation. No pancreatic inflammation. Spleen: Normal spleen Adrenals/urinary tract: Adrenal glands normal. No enhancing renal cortical lesion. Mild pelvicaliectasis and hydroureter on the RIGHT. No obstructing lesion identified. Bladder is collapsed from Foley catheter. Enlarged prostate gland again noted. Delayed imaging demonstrates contrast through the entirety of the LEFT ureter entering the bladder. No obstruction. There is delayed excretion and no significant contrast within the mildly dilated RIGHT ureter. Stomach/Bowel: The stomach, duodenum, and small bowel normal. The colon and rectosigmoid colon are normal. Vascular/Lymphatic: Abdominal aorta normal caliber. Multiple small lymph nodes in the operator space is. Small lymph node in the LEFT perirectal fat measuring 5 mm on image 83/4) Reproductive: Prostatomegaly.  Foley catheter in place. Other: No free fluid. Musculoskeletal: No aggressive osseous lesion. IMPRESSION: 1. Mild pelvicaliectasis and hydroureter on the RIGHT. No obstructing lesion identified. Potential debris within the RIGHT ureter. RIGHT ureter not opacified on postcontrast delayed imaging. 2. Bladder collapsed from Foley catheter. 3. Prostatomegaly. 4. Small lymph nodes in the operator space and LEFT perirectal fat. Electronically Signed   By: SSuzy BouchardM.D.   On: 07/06/2022 12:50   [place]NAL  Result Date: 07/05/2022 CLINICAL DATA:  Gross hematuria EXAM: RENAL / URINARY TRACT ULTRASOUND COMPLETE COMPARISON:  CT abdomen 09/09/2010 FINDINGS: Right Kidney: Renal measurements: 11.2 by 6.2 by 4.8 cm = volume: 176 mL. Echogenicity within normal limits. No mass or hydronephrosis visualized. Left Kidney: Renal measurements: 11.5 by 7.0 by 4.7 cm = volume: 199 mL. Echogenicity within normal limits. No mass or hydronephrosis visualized. Bladder: Foley catheter noted in collapsed urinary bladder, heterogeneous masslike appearance around the catheter  could be from clot,  tumor, or extensive wall thickening from neurogenic bladder. Other: Prostatomegaly suspected. IMPRESSION: 1. No hydronephrosis. 2. Foley catheter noted in collapsed urinary bladder, heterogeneous masslike appearance around the catheter could be from intraluminal clot, tumor, or extensive wall thickening from neurogenic bladder. Cystoscopy may be indicated. 3. Prostatomegaly. Electronically Signed   By: Van Clines M.D.   On: 07/05/2022 18:04    Assessment/Plan: 1. Gross hematuria: S/p cysto/clot evac/fulguration on 07/05/2022. Identified trauma in prostatic urethra and right bladder neck, also blood effluxing from RUO. Renal u/s 3/5 with no upper tract abnormality. CTU with mild right hydro and debris in ureter. 2. UTI: Ucx 07/02/2022 >100K staph aureus 3. Elevated PSA/PIRADS 4 prostate lesion on MRI 06/2022. Was scheduled for MRI fusion biopsy this week however this is postponed due to admission with UTI. 4. BPH/retention: Developed urinary retention in 06/2022 requiring foley placement outpatient   -Clamped CBI. Ideally will downsize foley catheter prior to discharge however may leave in place for a few days to avoid recurrent hematuria. -CTU with mild right hydro and debris in ureter. Will require outpatient ureteroscopy with possible biopsy.  -Continue antibiotics -Will delay MRI fusion biopsy until he recovers from infection -He is undergoing outpatient evaluation with GI and gen surgery to work up his perirectal lymph nodes -Following -Likely ok to discharge home tomorrow from GU standpoint.   LOS: 4 days   Matt R.  MD 07/07/2022, 11:55 AM [facility]  Pager: [phone]

## 2022-07-07 NOTE — Progress Notes (Signed)
[facility] Infectious Diseases Follow Up Note  Patient Identification: Patient Name: Steven Carter MRN: [id number] [place] Date: 07/02/2022  7:58 AM Age: 77 y.o.Today's Date: 07/07/2022  Reason for Visit: Follow up on MSSA bacteremia   Principal Problem:   MSSA bacteremia Active Problems:   UTI (urinary tract infection)   Gross hematuria   Benign prostatic hyperplasia   Hypothyroidism   Chronic systolic CHF (congestive heart failure) (HCC)   AKI (acute kidney injury) ([place])   Acute blood loss anemia   Mesenteric lymphadenopathy  Antibiotics:  Cefazolin 3/3-c Ceftriaxone 3/2  Lines/Hardwares:   Interval Events: Continues to be afebrile, no leukocytosis, hemoglobin 7. CT from 3/6 noted   Assessment 77 year old male with PMH as below including BPH, Hypothyroidism as well as recent episode of urinary retention requiring Foley catheter placement with also concern for rectal neoplasm versus locally invasive prostate neoplasm undergoing malignancy work up who presented to the ED with concern for clogged Foley catheter as well as blood in his urine and suprapubic abdominal pain. Admitted with   # MSSA bacteremia likely 2/2 MSSA UTI in the setting of urologoc instrumentation - Repeat blood cx 3/4 NG in 3 days  - 3/4 TTE negative for vegetations - 4/7 TEE negative for vegetations or endocarditis   # Gross hematuria  # Prostate lesion on MRI 06/2022/elevated PSA # BPH - hb stable at 7 - 3/5 Cystourethroscopy, Clot Evacuation with Fulguration  - Urology following, on CBI, defer management to them   # Mesenteric lymphadenopathy  - GI consulted, plan for OP colonoscopy/sigmoidoscopy  # CHF/Dilated Cardiomyopathy - Cardiology following and ischemic work up once hematuria/bacteremia resolved   Recommendations Continue cefazolin Needs midline  Plan for 2 weeks IV cefazolin ( EOT 3/18) to be followed by 2 weeks of PO linezolid  starting 3/19.  Fu in [place] in 2 weeks  Fu with Urology and GI as planned  ID available as needed, please call with questions   Rest of the management as per the primary team. Thank you for the consult. Please page with pertinent questions or concerns.  ______________________________________________________________________ Subjective patient seen and examined at the bedside. Doing well with no complaints   Past Medical History:  Diagnosis Date   BPH (benign prostatic hyperplasia)    Hypothyroidism    Thyroid disease    Past Surgical History:  Procedure Laterality Date   CYSTOSCOPY WITH FULGERATION N/A 07/05/2022   Procedure: CYSTOSCOPY WITH FULGERATION, CLOT EVACUATION;  Surgeon: Pamala Hurry, MD;  Location: [facility];  Service: Urology;  Laterality: N/A;    Vitals BP (!) 151/61 (BP Location: Right Arm)   Pulse 79   Temp 98.2 F (36.8 C) (Oral)   Resp 18   Ht '6\' 1"'$  (1.854 m)   Wt 98.7 kg   SpO2 100%   BMI 28.71 kg/m     Physical Exam Constitutional: Elderly male sitting in the chair and appears comfortable    Comments: HEENT WNL  Cardiovascular:     Rate and Rhythm: Normal rate and regular rhythm.     Heart sounds: s1s2   Pulmonary:     Effort: Pulmonary effort is normal.     Comments: Normal breath sounds  Abdominal:     Palpations: Abdomen is soft.     Tenderness: Nondistended and nontender, Foley's draining clear urine   Musculoskeletal:        General: No swelling or tenderness in peripheral joints.  No vertebral tenderness,  No metatstatic sites of infection Skin:  Comments: No rashes  Neurological:     General: Awake/alert and oriented.  Grossly nonfocal  Psychiatric:        Mood and Affect: Mood normal.   Pertinent Microbiology Results for orders placed or performed during the hospital encounter of 07/02/22  Urine Culture     Status: Abnormal   Collection Time: 07/02/22  9:42 AM   Specimen: Urine, Random  Result Value Ref Range Status    Specimen Description   Final    URINE, RANDOM Performed at [facility], [address] [place], [place] [address]    Special Requests   Final    NONE Reflexed from [id number] Performed at [facility], [address] [place], [place] [address]    Culture >=100,000 COLONIES/mL STAPHYLOCOCCUS AUREUS (A)  Final   Report Status 07/04/2022 FINAL  Final   Organism ID, Bacteria STAPHYLOCOCCUS AUREUS (A)  Final      Susceptibility   Staphylococcus aureus - MIC*    CIPROFLOXACIN <=0.5 SENSITIVE Sensitive     GENTAMICIN <=0.5 SENSITIVE Sensitive     NITROFURANTOIN <=16 SENSITIVE Sensitive     OXACILLIN 0.5 SENSITIVE Sensitive     TETRACYCLINE <=1 SENSITIVE Sensitive     VANCOMYCIN <=0.5 SENSITIVE Sensitive     TRIMETH/SULFA <=10 SENSITIVE Sensitive     CLINDAMYCIN <=0.25 SENSITIVE Sensitive     RIFAMPIN <=0.5 SENSITIVE Sensitive     Inducible Clindamycin NEGATIVE Sensitive     * >=100,000 COLONIES/mL STAPHYLOCOCCUS AUREUS  Blood culture (routine x 2)     Status: Abnormal   Collection Time: 07/02/22 11:35 AM   Specimen: BLOOD  Result Value Ref Range Status   Specimen Description   Final    BLOOD RIGHT ANTECUBITAL Performed at [facility], [address] [place], [place] [address]    Special Requests   Final    BOTTLES DRAWN AEROBIC AND ANAEROBIC Blood Culture adequate volume Performed at [facility], [address] [place], [place] [address]    Culture  Setup Time   Final    GRAM POSITIVE COCCI IN CLUSTERS IN BOTH AEROBIC AND ANAEROBIC BOTTLES Organism ID to follow CRITICAL RESULT CALLED TO, READ BACK BY AND VERIFIED WITH: M LILLISTON,PHARMD'@0555'$  07/03/22 [place] Performed at [facility], [place] [address] [place], [place] [address]    Culture STAPHYLOCOCCUS AUREUS (A)  Final   Report Status 07/05/2022 FINAL  Final   Organism ID, Bacteria STAPHYLOCOCCUS AUREUS  Final      Susceptibility    Staphylococcus aureus - MIC*    CIPROFLOXACIN <=0.5 SENSITIVE Sensitive     ERYTHROMYCIN <=0.25 SENSITIVE Sensitive     GENTAMICIN <=0.5 SENSITIVE Sensitive     OXACILLIN 0.5 SENSITIVE Sensitive     TETRACYCLINE <=1 SENSITIVE Sensitive     VANCOMYCIN 1 SENSITIVE Sensitive     TRIMETH/SULFA <=10 SENSITIVE Sensitive     CLINDAMYCIN <=0.25 SENSITIVE Sensitive     RIFAMPIN <=0.5 SENSITIVE Sensitive     Inducible Clindamycin NEGATIVE Sensitive     * STAPHYLOCOCCUS AUREUS  Blood Culture ID Panel (Reflexed)     Status: Abnormal   Collection Time: 07/02/22 11:35 AM  Result Value Ref Range Status   Enterococcus faecalis NOT DETECTED NOT DETECTED Final   Enterococcus Faecium NOT DETECTED NOT DETECTED Final   Listeria monocytogenes NOT DETECTED NOT DETECTED Final   Staphylococcus species DETECTED (A) NOT DETECTED Final    Comment: CRITICAL RESULT CALLED TO, READ BACK BY AND VERIFIED WITH: M LILLISTON,PHARMD'@0555'$  07/03/22 [place]  Staphylococcus aureus ([facility]) DETECTED (A) NOT DETECTED Final    Comment: CRITICAL RESULT CALLED TO, READ BACK BY AND VERIFIED WITH: M LILLISTON,PHARMD'@0555'$  07/03/22 [place]    Staphylococcus epidermidis NOT DETECTED NOT DETECTED Final   Staphylococcus lugdunensis NOT DETECTED NOT DETECTED Final   Streptococcus species NOT DETECTED NOT DETECTED Final   Streptococcus agalactiae NOT DETECTED NOT DETECTED Final   Streptococcus pneumoniae NOT DETECTED NOT DETECTED Final   Streptococcus pyogenes NOT DETECTED NOT DETECTED Final   A.calcoaceticus-baumannii NOT DETECTED NOT DETECTED Final   Bacteroides fragilis NOT DETECTED NOT DETECTED Final   Enterobacterales NOT DETECTED NOT DETECTED Final   Enterobacter cloacae complex NOT DETECTED NOT DETECTED Final   Escherichia coli NOT DETECTED NOT DETECTED Final   Klebsiella aerogenes NOT DETECTED NOT DETECTED Final   Klebsiella oxytoca NOT DETECTED NOT DETECTED Final   Klebsiella pneumoniae NOT DETECTED NOT DETECTED Final   Proteus  species NOT DETECTED NOT DETECTED Final   Salmonella species NOT DETECTED NOT DETECTED Final   Serratia marcescens NOT DETECTED NOT DETECTED Final   Haemophilus influenzae NOT DETECTED NOT DETECTED Final   Neisseria meningitidis NOT DETECTED NOT DETECTED Final   Pseudomonas aeruginosa NOT DETECTED NOT DETECTED Final   Stenotrophomonas maltophilia NOT DETECTED NOT DETECTED Final   Candida albicans NOT DETECTED NOT DETECTED Final   Candida auris NOT DETECTED NOT DETECTED Final   Candida glabrata NOT DETECTED NOT DETECTED Final   Candida krusei NOT DETECTED NOT DETECTED Final   Candida parapsilosis NOT DETECTED NOT DETECTED Final   Candida tropicalis NOT DETECTED NOT DETECTED Final   Cryptococcus neoformans/gattii NOT DETECTED NOT DETECTED Final   Meth resistant mecA/C and MREJ NOT DETECTED NOT DETECTED Final    Comment: Performed at [facility], [address] [place], [place] [address]  Blood culture (routine x 2)     Status: Abnormal   Collection Time: 07/02/22 11:42 AM   Specimen: BLOOD RIGHT HAND  Result Value Ref Range Status   Specimen Description   Final    BLOOD RIGHT HAND Performed at [facility], [address], [place], [place] [address]    Special Requests   Final    BOTTLES DRAWN AEROBIC AND ANAEROBIC Blood Culture adequate volume Performed at [facility], [address], [place], [place] [address]    Culture  Setup Time   Final    GRAM POSITIVE COCCI IN CLUSTERS IN BOTH AEROBIC AND ANAEROBIC BOTTLES CRITICAL VALUE NOTED.  VALUE IS CONSISTENT WITH PREVIOUSLY REPORTED AND CALLED VALUE.    Culture (A)  Final    STAPHYLOCOCCUS AUREUS SUSCEPTIBILITIES PERFORMED ON PREVIOUS CULTURE WITHIN THE LAST 5 DAYS. Performed at [facility], [place] [address] [place], [place] [address]    Report Status 07/05/2022 FINAL  Final  Culture, blood (Routine X 2) w Reflex to ID Panel     Status: None (Preliminary result)   Collection  Time: 07/04/22  4:48 AM   Specimen: BLOOD  Result Value Ref Range Status   Specimen Description   Final    BLOOD BLOOD LEFT ARM Performed at [facility] [address], [place], [place] [address]    Special Requests   Final    BOTTLES DRAWN AEROBIC ONLY Blood Culture adequate volume Performed at [facility] [address], [place],  [address]    Culture   Final    NO GROWTH 2 DAYS Performed at [facility] [address], [place], [place]  [address]    Report Status PENDING  Incomplete  Culture, blood (Routine X 2) w Reflex to ID Panel     Status: None (Preliminary result)   Collection Time: 07/04/22  4:48 AM   Specimen: BLOOD  Result Value Ref Range Status   Specimen Description   Final    BLOOD BLOOD RIGHT ARM Performed at [facility] [address], [place], [place] [address]    Special Requests   Final    BOTTLES DRAWN AEROBIC ONLY Blood Culture adequate volume Performed at [facility] [address] [place], [place] [address]    Culture   Final    NO GROWTH 2 DAYS Performed at [facility] [address], [place], [place] [address]    Report Status PENDING  Incomplete  MRSA Next Gen by PCR, Nasal     Status: None   Collection Time: 07/05/22 11:22 PM   Specimen: Nasal Mucosa; Nasal Swab  Result Value Ref Range Status   MRSA by PCR Next Gen NOT DETECTED NOT DETECTED Final    Comment: (NOTE) The GeneXpert MRSA Assay (FDA approved for NASAL specimens only), is one component of a comprehensive MRSA colonization surveillance program. It is not intended to diagnose MRSA infection nor to guide or monitor treatment for MRSA infections. Test performance is not FDA approved in patients less than 29 years old. Performed at [facility], [place] [address] [place], [place] [address]    Pertinent Lab.    Latest Ref Rng & Units 07/07/2022    3:07 AM 07/06/2022     3:44 PM 07/06/2022    2:49 AM  CBC  WBC 4.0 - 10.5 K/uL 9.4   9.2   Hemoglobin 13.0 - 17.0 g/dL 7.0  7.7  7.7   Hematocrit 39.0 - 52.0 % 23.6  24.4  23.7   Platelets 150 - 400 K/uL 146   142       Latest Ref Rng & Units 07/07/2022    3:07 AM 07/06/2022    2:49 AM 07/05/2022    6:29 PM  CMP  Glucose 70 - 99 mg/dL 108  156    BUN 8 - 23 mg/dL 29  25    Creatinine 0.61 - 1.24 mg/dL 1.93  1.68    Sodium 135 - 145 mmol/L 134  135  135   Potassium 3.5 - 5.1 mmol/L 3.9  4.7  4.7   Chloride 98 - 111 mmol/L 105  104    CO2 22 - 32 mmol/L 21  23    Calcium 8.9 - 10.3 mg/dL 8.1  8.5    Total Protein 6.5 - 8.1 g/dL 5.8  6.1    Total Bilirubin 0.3 - 1.2 mg/dL 0.6  0.4    Alkaline Phos 38 - 126 U/L 35  39    AST 15 - 41 U/L 19  21    ALT 0 - 44 U/L 8  14      Pertinent Imaging today Plain films and CT images have been personally visualized and interpreted; radiology reports have been reviewed. Decision making incorporated into the Impression / Recommendations.  CT HEMATURIA WORKUP  Result Date: 07/06/2022 CLINICAL DATA:  Gross hematuria. Status cystoscopy and clot evacuation. Fulguration. EXAM: CT ABDOMEN AND PELVIS WITHOUT AND WITH CONTRAST TECHNIQUE: Multidetector CT imaging of the abdomen and pelvis was performed following the standard protocol before and following the bolus administration of intravenous contrast. RADIATION DOSE REDUCTION: This exam was performed  according to the departmental dose-optimization program which includes automated exposure control, adjustment of the mA and/or kV according to patient size and/or use of iterative reconstruction technique. CONTRAST:  34m OMNIPAQUE IOHEXOL 300 MG/ML  SOLN COMPARISON:  Prostate MRI 06/24/2022 FINDINGS: Lower chest: Lung bases are clear. Hepatobiliary: No focal hepatic lesion. Normal gallbladder. No biliary duct dilatation. Common bile duct is normal. Pancreas: Pancreas is normal. No ductal dilatation. No pancreatic inflammation. Spleen:  Normal spleen Adrenals/urinary tract: Adrenal glands normal. No enhancing renal cortical lesion. Mild pelvicaliectasis and hydroureter on the RIGHT. No obstructing lesion identified. Bladder is collapsed from Foley catheter. Enlarged prostate gland again noted. Delayed imaging demonstrates contrast through the entirety of the LEFT ureter entering the bladder. No obstruction. There is delayed excretion and no significant contrast within the mildly dilated RIGHT ureter. Stomach/Bowel: The stomach, duodenum, and small bowel normal. The colon and rectosigmoid colon are normal. Vascular/Lymphatic: Abdominal aorta normal caliber. Multiple small lymph nodes in the operator space is. Small lymph node in the LEFT perirectal fat measuring 5 mm on image 83/4) Reproductive: Prostatomegaly.  Foley catheter in place. Other: No free fluid. Musculoskeletal: No aggressive osseous lesion. IMPRESSION: 1. Mild pelvicaliectasis and hydroureter on the RIGHT. No obstructing lesion identified. Potential debris within the RIGHT ureter. RIGHT ureter not opacified on postcontrast delayed imaging. 2. Bladder collapsed from Foley catheter. 3. Prostatomegaly. 4. Small lymph nodes in the operator space and LEFT perirectal fat. Electronically Signed   By: SSuzy BouchardM.D.   On: 07/06/2022 12:50   [place]  Result Date: 07/05/2022 CLINICAL DATA:  Gross hematuria EXAM: RENAL / URINARY TRACT ULTRASOUND COMPLETE COMPARISON:  CT abdomen 09/09/2010 FINDINGS: Right Kidney: Renal measurements: 11.2 by 6.2 by 4.8 cm = volume: 176 mL. Echogenicity within normal limits. No mass or hydronephrosis visualized. Left Kidney: Renal measurements: 11.5 by 7.0 by 4.7 cm = volume: 199 mL. Echogenicity within normal limits. No mass or hydronephrosis visualized. Bladder: Foley catheter noted in collapsed urinary bladder, heterogeneous masslike appearance around the catheter could be from clot, tumor, or extensive wall thickening from neurogenic bladder. Other:  Prostatomegaly suspected. IMPRESSION: 1. No hydronephrosis. 2. Foley catheter noted in collapsed urinary bladder, heterogeneous masslike appearance around the catheter could be from intraluminal clot, tumor, or extensive wall thickening from neurogenic bladder. Cystoscopy may be indicated. 3. Prostatomegaly. Electronically Signed   By: WVan ClinesM.D.   On: 07/05/2022 18:04   ECHOCARDIOGRAM COMPLETE  Result Date: 07/04/2022    ECHOCARDIOGRAM REPORT   Patient Name:   CKELLAR CAMPIDate of Exam: 07/04/2022 Medical Rec #:  [id number]      Height:       73.0 in Accession #:    [id number]     Weight:       219.8 lb Date of Birth:  [birth year]      BSA:          2.240 m Patient Age:    89years        BP:           159/88 mmHg Patient Gender: M               HR:           73 bpm. Exam Location:  Inpatient Procedure: 2D Echo, Cardiac Doppler and Color Doppler  MODIFIED REPORT:      This report was modified by Dorris Carnes MD on 07/04/2022 due to Complete                                   impression.  Indications:     bacteremia  History:         Patient has no prior history of Echocardiogram examinations.  Sonographer:     Phineas Douglas Referring Phys:  [place] Diagnosing Phys: Dorris Carnes MD IMPRESSIONS  1. No obvious vegetation though cannot completely exclude REcomm TEE if clinically indicated.  2. Left ventricular ejection fraction, by estimation, is 35%. The left ventricle demonstrates global hypokinesis. There is mild concentric left ventricular hypertrophy.  3. Right ventricular systolic function is normal. The right ventricular size is normal.  4. Trivial mitral valve regurgitation.  5. The aortic valve is tricuspid. Aortic valve regurgitation is trivial. FINDINGS  Left Ventricle: Left ventricular ejection fraction, by estimation, is 35%. The left ventricle demonstrates global hypokinesis. The left ventricular internal cavity size was normal in size. There is mild  concentric left ventricular hypertrophy. Right Ventricle: The right ventricular size is normal. Right vetricular wall thickness was not assessed. Right ventricular systolic function is normal. Left Atrium: Left atrial size was normal in size. Right Atrium: Right atrial size was normal in size. Pericardium: There is no evidence of pericardial effusion. Mitral Valve: There is mild thickening of the mitral valve leaflet(s). Trivial mitral valve regurgitation. Tricuspid Valve: The tricuspid valve is normal in structure. Tricuspid valve regurgitation is trivial. Aortic Valve: The aortic valve is tricuspid. Aortic valve regurgitation is trivial. Aortic regurgitation PHT measures 495 msec. Pulmonic Valve: The pulmonic valve was not well visualized. Pulmonic valve regurgitation is not visualized. No evidence of pulmonic stenosis. Aorta: The aortic root and ascending aorta are structurally normal, with no evidence of dilitation. IAS/Shunts: No atrial level shunt detected by color flow Doppler.  LEFT VENTRICLE PLAX 2D LVIDd:         5.30 cm      Diastology LVIDs:         3.90 cm      LV e' medial:    5.77 cm/s LV PW:         1.20 cm      LV E/e' medial:  14.9 LV IVS:        1.30 cm      LV e' lateral:   9.90 cm/s LVOT diam:     2.00 cm      LV E/e' lateral: 8.7 LV SV:         66 LV SV Index:   29 LVOT Area:     3.14 cm  LV Volumes (MOD) LV vol d, MOD A2C: 139.0 ml LV vol d, MOD A4C: 166.0 ml LV vol s, MOD A2C: 75.0 ml LV vol s, MOD A4C: 90.6 ml LV SV MOD A2C:     64.0 ml LV SV MOD A4C:     166.0 ml LV SV MOD BP:      75.8 ml RIGHT VENTRICLE             IVC RV Basal diam:  4.00 cm     IVC diam: 1.70 cm RV S prime:     13.10 cm/s TAPSE (M-mode): 2.0 cm LEFT ATRIUM             Index  RIGHT ATRIUM           Index LA diam:        3.50 cm 1.56 cm/m   RA Area:     19.70 cm LA Vol (A2C):   68.4 ml 30.54 ml/m  RA Volume:   58.60 ml  26.16 ml/m LA Vol (A4C):   46.1 ml 20.58 ml/m LA Biplane Vol: 60.5 ml 27.01 ml/m  AORTIC  VALVE LVOT Vmax:   101.00 cm/s LVOT Vmean:  71.500 cm/s LVOT VTI:    0.210 m AI PHT:      495 msec  AORTA Ao Root diam: 3.10 cm Ao Asc diam:  3.70 cm MITRAL VALVE MV Area (PHT): 4.39 cm    SHUNTS MV Decel Time: 173 msec    Systemic VTI:  0.21 m MV E velocity: 85.70 cm/s  Systemic Diam: 2.00 cm MV A velocity: 95.10 cm/s MV E/A ratio:  0.90 Dorris Carnes MD Electronically signed by Dorris Carnes MD Signature Date/Time: 07/04/2022/6:26:22 PM    Final (Updated)    MR PROSTATE W WO CONTRAST  Result Date: 06/24/2022 CLINICAL DATA:  A 77 year old male presents for evaluation of elevated PSA to 4.86. EXAM: MR PROSTATE WITHOUT AND WITH CONTRAST TECHNIQUE: Multiplanar multis a 77 year old male with history of elevated PSA to 4.86. [facility] MRI images were obtained of the pelvis centered about the prostate. Pre and post contrast images were obtained. CONTRAST:  10 mL [place] COMPARISON:  Prior prostate MR from 2016. FINDINGS: Prostate: Prostatomegaly. Signs of BPH. Transitional zone: Area of vague T2 hypointensity on image 29/8 at the boundary of apical RIGHT paramidline transitional and peripheral zone. This measures 9 mm on image 30/6 and 10 mm on image 29/8. PIRADS category 4 displaying both hypointense features on T2 and marked restricted diffusion. Peripheral zone: Marked thinning of the peripheral zone without high-risk lesion. Volume: 157 cc. Transcapsular spread: Bulging of the anterior capsule on image 84/9 raising the question of extracapsular extension. Seminal vesicle involvement: Absent, atrophic bilateral seminal vesicles. Neurovascular bundle involvement: Absent Pelvic adenopathy: No lymph nodes in the expected pathway or usual pathway for prostate neoplasm. There are numerous rounded and enhancing lymph nodes in the mesorectum and superior rectal pathway (image 42/14 5 mm enhancing lymph node in this location with rounded morphology. (Image 24 and 25/14) 7 mm lymph node along superior rectal pathways. RIGHT pelvic  sidewall lymph node 11 mm.  (Image 28/14) Bone metastasis: Absent Other findings: No visible rectal lesion which would be the most common cause of mesorectal lymph nodes. Rectum is decompressed. Trace fluid in the pelvis. IMPRESSION: 1. PIRADS category 4 lesion at the boundary of the RIGHT paramidline apical transitional and peripheral zone. 2. Bulging of the anterior capsule raising the question of extracapsular extension. 3. Numerous rounded and enhancing lymph nodes in the mesorectum and superior rectal pathway. Findings are unusual for reactive lymph nodes and are usually seen in the setting of rectal neoplasm. Consider correlation with recent colonoscopy if available or sigmoidoscopy to further evaluate if not recently performed. 4. Top-normal sized lymph node along the RIGHT pelvis also of uncertain significance. Imaging of the abdomen and pelvis may be helpful with direction of further workup as dictated by biopsy results. These results will be called to the ordering clinician or representative by the Radiologist Assistant, and communication documented in the PACS or [employer]. Electronically Signed   By: Zetta Bills M.D.   On: 06/24/2022 18:56     I spent 55 minutes for this  patient encounter including review of prior medical records, coordination of care with primary/other specialist with greater than 50% of time being face to face/counseling and discussing diagnostics/treatment plan with the patient/family.  Electronically signed by:   Rosiland Oz, MD Infectious Disease Physician [facility] Pager: [phone]

## 2022-07-07 NOTE — Consult Note (Addendum)
Referring Provider: No ref. provider found Primary Care Physician:  Center, [facility] Primary Gastroenterologist:  Dr.  Luiz Iron for Consultation:  Abnormal imaging of the rectum  HPI: Steven Carter is a 77 y.o. male with hypothyroidism and recently diagnosed with urinary retention who presented with pelvic pain and inability urinate.  Was incidentally found to have MSSA bacteremia and he is being treated for that.  He is getting a TEE today.  He had MRI imaging of his prostate that showed the following:  Numerous rounded and enhancing lymph nodes in the mesorectum and superior rectal pathway. Findings are unusual for reactive lymph nodes and are usually seen in the setting of rectal neoplasm. Consider correlation with recent colonoscopy if available or sigmoidoscopy to further evaluate if not recently performed.  He tells me that he has had colonoscopy in the past by [facility].  He tells me he was referred there again by his PCP recently, but the wait was so long so then they decided to try to get into our office instead.  The referral to our office was just placed a couple of days ago for screening colonoscopy.  He tells me that he has had some constipation issues recently because of his enlarged prostate.  He does use Dulcolax at home, which did help.  Here he is only getting Colace here and MiraLAX is listed as needed.  No rectal bleeding.  Hemoglobin low due to bladder hemorrhage.  Got a unit of packed red blood cells this morning for hemoglobin of 7 g.  Past Medical History:  Diagnosis Date   BPH (benign prostatic hyperplasia)    Hypothyroidism    Thyroid disease     Past Surgical History:  Procedure Laterality Date   CYSTOSCOPY WITH FULGERATION N/A 07/05/2022   Procedure: CYSTOSCOPY WITH FULGERATION, CLOT EVACUATION;  Surgeon: Pamala Hurry, MD;  Location: [facility];  Service: Urology;  Laterality: N/A;    Prior to Admission medications   Medication Sig Start Date  End Date Taking? Authorizing Provider  cephALEXin (KEFLEX) 500 MG capsule Take 1 capsule (500 mg total) by mouth 4 (four) times daily. 07/02/22  Yes Regan Lemming, MD  finasteride (PROSCAR) 5 MG tablet Take 5 mg by mouth daily. 04/19/19  Yes [provider]  levofloxacin (LEVAQUIN) 750 MG tablet Take 750 mg by mouth daily. 07/01/22  Yes [provider]  levothyroxine (SYNTHROID) 112 MCG tablet Take 112 mcg by mouth daily. 01/17/19  Yes [provider]  oxybutynin (DITROPAN-XL) 5 MG 24 hr tablet Take 5 mg by mouth daily. 06/28/22  Yes [provider]  tamsulosin (FLOMAX) 0.4 MG CAPS capsule Take 0.8 mg by mouth daily.   Yes [provider]  gentamicin cream (GARAMYCIN) 0.1 % Apply 1 application topically 2 (two) times daily. Patient not taking: Reported on 07/02/2022 04/03/19   Edrick Kins, DPM  meloxicam (MOBIC) 15 MG tablet Take 15 mg by mouth daily as needed. Patient not taking: Reported on 07/02/2022 03/31/19   [provider]    Current Facility-Administered Medications  Medication Dose Route Frequency Provider Last Rate Last Admin   0.9 %  sodium chloride infusion (Manually program via Guardrails IV Fluids)   Intravenous Once Freddi Starr, MD       0.9 %  sodium chloride infusion   Intravenous Continuous Margie Billet, PA-C 20 mL/hr at 07/07/22 0849 Infusion Verify at 07/07/22 0849   0.9 %  sodium chloride infusion   Intravenous PRN Grandville Silos,  Malachy Moan, MD   Stopped at 07/05/22 2210   acetaminophen (TYLENOL) tablet 650 mg  650 mg Oral Q6H PRN Lucillie Garfinkel, MD   650 mg at 07/03/22 1803   Or   acetaminophen (TYLENOL) suppository 650 mg  650 mg Rectal Q6H PRN Hollice Gong, Mir M, MD       ceFAZolin (ANCEF) IVPB 2g/100 mL premix  2 g Intravenous Q8H Thomes Lolling, [facility]   Stopped at 07/07/22 0820   Chlorhexidine Gluconate Cloth 2 % PADS 6 each  6 each Topical QHS Danford, Suann Larry, MD       docusate sodium (COLACE) capsule  100 mg  100 mg Oral BID Hollice Gong, Mir M, MD   100 mg at 07/07/22 [id number]   finasteride (PROSCAR) tablet 5 mg  5 mg Oral BID Eugenie Filler, MD   5 mg at 07/07/22 [id number]   hydrALAZINE (APRESOLINE) injection 5 mg  5 mg Intravenous Q6H PRN Hollice Gong, Mir M, MD       HYDROmorphone (DILAUDID) injection 0.5 mg  0.5 mg Intravenous Q4H PRN Eugenie Filler, MD   0.5 mg at 07/05/22 1629   levothyroxine (SYNTHROID) tablet 112 mcg  112 mcg Oral Daily Hollice Gong, Mir M, MD   112 mcg at 07/07/22 0429   ondansetron (ZOFRAN) tablet 4 mg  4 mg Oral Q6H PRN Hollice Gong, Mir M, MD       Or   ondansetron [facility]) injection 4 mg  4 mg Intravenous Q6H PRN Lucillie Garfinkel, MD       Oral care mouth rinse  15 mL Mouth Rinse PRN Eugenie Filler, MD       oxybutynin (DITROPAN) tablet 5 mg  5 mg Oral Q8H PRN Eugenie Filler, MD   5 mg at 07/06/22 0548   oxybutynin (DITROPAN-XL) 24 hr tablet 5 mg  5 mg Oral Daily Eugenie Filler, MD   5 mg at 07/07/22 [id number]   polyethylene glycol (MIRALAX / GLYCOLAX) packet 17 g  17 g Oral Daily PRN Hollice Gong, Mir M, MD   17 g at 07/06/22 1333   sodium chloride irrigation 0.9 % 3,000 mL  3,000 mL Irrigation Continuous Janith Lima, MD   3,000 mL at 07/07/22 0755   tamsulosin (FLOMAX) capsule 0.4 mg  0.4 mg Oral BID Eugenie Filler, MD   0.4 mg at 07/07/22 [id number]    Allergies as of 07/02/2022   (No Known Allergies)    History reviewed. No pertinent family history.  Social History   Socioeconomic History   Marital status: Married    Spouse name: Not on file   Number of children: Not on file   Years of education: Not on file   Highest education level: Not on file  Occupational History   Not on file  Tobacco Use   Smoking status: Never   Smokeless tobacco: Never  Substance and Sexual Activity   Alcohol use: Yes    Alcohol/week: 7.0 standard drinks of alcohol    Types: 7 Glasses of wine per week   Drug use: Never   Sexual activity: Not on file  Other Topics  Concern   Not on file  Social History Narrative   Not on file   Social Determinants of Health   Financial Resource Strain: Not on file  Food Insecurity: No Food Insecurity (07/04/2022)   Hunger Vital Sign    Worried About Running Out of Food in the Last Year: Never true    Ran Out  of Food in the Last Year: Never true  Transportation Needs: No Transportation Needs (07/04/2022)   PRAPARE - Hydrologist (Medical): No    Lack of Transportation (Non-Medical): No  Physical Activity: Not on file  Stress: Not on file  Social Connections: Not on file  Intimate Partner Violence: Not At Risk (07/04/2022)   Humiliation, Afraid, Rape, and Kick questionnaire    Fear of Current or Ex-Partner: No    Emotionally Abused: No    Physically Abused: No    Sexually Abused: No    Review of Systems: ROS is O/W negative except as mentioned in HPI.  Physical Exam: Vital signs in last 24 hours: Temp:  [97.5 F (36.4 C)-98.5 F (36.9 C)] 97.8 F (36.6 C) (03/07 0949) Pulse Rate:  [76-96] 84 (03/07 0949) Resp:  [16-25] 19 (03/07 0949) BP: (124-170)/(45-80) 148/65 (03/07 0949) SpO2:  [95 %-100 %] 100 % (03/07 0949) Last BM Date : 07/06/22 General:  Alert, Well-developed, well-nourished, pleasant and cooperative in NAD Head:  Normocephalic and atraumatic. Eyes:  Sclera clear, no icterus.  Conjunctiva pink. Ears:  Normal auditory acuity. Mouth:  No deformity or lesions.   Lungs:  Clear throughout to auscultation.  No wheezes, crackles, or rhonchi.  Heart:  Regular rate and rhythm; no murmurs, clicks, rubs, or gallops. Abdomen:  Soft, non-distended.  BS present.  Some suprapubic TTP.   Msk:  Symmetrical without gross deformities. Pulses:  Normal pulses noted. Extremities:  Without clubbing or edema. Neurologic:  Alert and oriented x 4;  grossly normal neurologically. Skin:  Intact without significant lesions or rashes. Psych:  Alert and cooperative. Normal mood and  affect.  Intake/Output from previous day: 03/06 0701 - 03/07 0700 In: 4220 [I.V.:920; IV Piggyback:300] Out: 5425 [Urine:5425] Intake/Output this shift: Total I/O In: 3200.6 [I.V.:103.1; Other:3000; IV Piggyback:97.5] Out: 925 [Urine:925]  Lab Results: Recent Labs    07/05/22 2038 07/06/22 0249 07/06/22 1544 07/07/22 0307  WBC 8.8 9.2  --  9.4  HGB 8.2* 7.7* 7.7* 7.0*  HCT 25.5* 23.7* 24.4* 23.6*  PLT 151 142*  --  146*   BMET Recent Labs    07/05/22 0518 07/05/22 1829 07/06/22 0249 07/07/22 0307  NA 136 135 135 134*  K 4.2 4.7 4.7 3.9  CL 105  --  104 105  CO2 24  --  23 21*  GLUCOSE 112*  --  156* 108*  BUN 14  --  25* 29*  CREATININE 0.94  --  1.68* 1.93*  CALCIUM 8.3*  --  8.5* 8.1*   LFT Recent Labs    07/07/22 0307  PROT 5.8*  ALBUMIN 3.3*  AST 19  ALT 8  ALKPHOS 35*  BILITOT 0.6   PT/INR No results for input(s): "LABPROT", "INR" in the last 72 hours. Hepatitis Panel No results for input(s): "HEPBSAG", "HCVAB", "HEPAIGM", "HEPBIGM" in the last 72 hours.    Studies/Results: CT HEMATURIA WORKUP  Result Date: 07/06/2022 CLINICAL DATA:  Gross hematuria. Status cystoscopy and clot evacuation. Fulguration. EXAM: CT ABDOMEN AND PELVIS WITHOUT AND WITH CONTRAST TECHNIQUE: Multidetector CT imaging of the abdomen and pelvis was performed following the standard protocol before and following the bolus administration of intravenous contrast. RADIATION DOSE REDUCTION: This exam was performed according to the departmental dose-optimization program which includes automated exposure control, adjustment of the mA and/or kV according to patient size and/or use of iterative reconstruction technique. CONTRAST:  69m OMNIPAQUE IOHEXOL 300 MG/ML  SOLN COMPARISON:  Prostate MRI 06/24/2022  FINDINGS: Lower chest: Lung bases are clear. Hepatobiliary: No focal hepatic lesion. Normal gallbladder. No biliary duct dilatation. Common bile duct is normal. Pancreas: Pancreas is normal.  No ductal dilatation. No pancreatic inflammation. Spleen: Normal spleen Adrenals/urinary tract: Adrenal glands normal. No enhancing renal cortical lesion. Mild pelvicaliectasis and hydroureter on the RIGHT. No obstructing lesion identified. Bladder is collapsed from Foley catheter. Enlarged prostate gland again noted. Delayed imaging demonstrates contrast through the entirety of the LEFT ureter entering the bladder. No obstruction. There is delayed excretion and no significant contrast within the mildly dilated RIGHT ureter. Stomach/Bowel: The stomach, duodenum, and small bowel normal. The colon and rectosigmoid colon are normal. Vascular/Lymphatic: Abdominal aorta normal caliber. Multiple small lymph nodes in the operator space is. Small lymph node in the LEFT perirectal fat measuring 5 mm on image 83/4) Reproductive: Prostatomegaly.  Foley catheter in place. Other: No free fluid. Musculoskeletal: No aggressive osseous lesion. IMPRESSION: 1. Mild pelvicaliectasis and hydroureter on the RIGHT. No obstructing lesion identified. Potential debris within the RIGHT ureter. RIGHT ureter not opacified on postcontrast delayed imaging. 2. Bladder collapsed from Foley catheter. 3. Prostatomegaly. 4. Small lymph nodes in the operator space and LEFT perirectal fat. Electronically Signed   By: Suzy Bouchard M.D.   On: 07/06/2022 12:50   US RENAL  Result Date: 07/05/2022 CLINICAL DATA:  Gross hematuria EXAM: RENAL / URINARY TRACT ULTRASOUND COMPLETE COMPARISON:  CT abdomen 09/09/2010 FINDINGS: Right Kidney: Renal measurements: 11.2 by 6.2 by 4.8 cm = volume: 176 mL. Echogenicity within normal limits. No mass or hydronephrosis visualized. Left Kidney: Renal measurements: 11.5 by 7.0 by 4.7 cm = volume: 199 mL. Echogenicity within normal limits. No mass or hydronephrosis visualized. Bladder: Foley catheter noted in collapsed urinary bladder, heterogeneous masslike appearance around the catheter could be from clot, tumor, or  extensive wall thickening from neurogenic bladder. Other: Prostatomegaly suspected. IMPRESSION: 1. No hydronephrosis. 2. Foley catheter noted in collapsed urinary bladder, heterogeneous masslike appearance around the catheter could be from intraluminal clot, tumor, or extensive wall thickening from neurogenic bladder. Cystoscopy may be indicated. 3. Prostatomegaly. Electronically Signed   By: Van Clines M.D.   On: 07/05/2022 18:04    IMPRESSION:  -Bladder hemorrhage/clot/urinary retention found incidentally to have MSSA bacteremia:  Getting TEE today. -MRI prostate/pelvis showing numerous rounded and enhancing lymph nodes in the mesorectum and superior rectal pathway. Findings are unusual for reactive lymph nodes and are usually seen in the setting of rectal neoplasm.  Consider correlation with recent colonoscopy if available or sigmoidoscopy to further evaluate if not recently performed. *Constipation: Reports he had been using Dulcolax at home which was helping.  They only have him on Colace here and then MiraLAX as needed.  Will order MiraLAX daily. *EF of 35% *Anemia due to bladder hemorrhage: Hemoglobin was 7 grams this a.m. so he received a unit of packed red blood cells.  PLAN: -Patient does not want colonoscopy right now as inpatient.  He would like a little bit of time to recover from this bacteremia.  Can plan for this as an outpatient in the coming weeks.   Laban Emperor. Zehr  07/07/2022, 10:12 AM  GI ATTENDING  History, laboratories, x-rays reviewed.  Agree with comprehensive consultation note as outlined above.  Patient is not interested in colonoscopy right now.  We will have him follow-up with Janett Billow in our office few weeks.  He will need colonoscopy set up to clarify findings on CT. Will sign off   N. Geri Seminole., M.D.  [facility]

## 2022-07-07 NOTE — Progress Notes (Signed)
Progress Note  Patient Name: Steven Carter Date of Encounter: 07/07/2022  Primary Cardiologist: Berniece Salines, DO   Subjective   Patient seen examined his bedside.  Aware of TEE today.   Inpatient Medications    Scheduled Meds:  sodium chloride   Intravenous Once   Chlorhexidine Gluconate Cloth  6 each Topical QHS   docusate sodium  100 mg Oral BID   finasteride  5 mg Oral BID   levothyroxine  112 mcg Oral Daily   oxybutynin  5 mg Oral Daily   tamsulosin  0.4 mg Oral BID   Continuous Infusions:  sodium chloride 20 mL/hr at 07/07/22 0849   sodium chloride Stopped (07/05/22 2210)    ceFAZolin (ANCEF) IV Stopped (07/07/22 0820)   sodium chloride irrigation     PRN Meds: sodium chloride, acetaminophen **OR** acetaminophen, hydrALAZINE, HYDROmorphone (DILAUDID) injection, ondansetron **OR** ondansetron (ZOFRAN) IV, mouth rinse, oxybutynin, polyethylene glycol   Vital Signs    Vitals:   07/07/22 0800 07/07/22 0817 07/07/22 0930 07/07/22 0949  BP:   133/65 (!) 148/65  Pulse: 81  87 84  Resp: 18  (!) 21 19  Temp:  98.2 F (36.8 C) 98.1 F (36.7 C) 97.8 F (36.6 C)  TempSrc:  Oral Oral Oral  SpO2: 96%  100% 100%  Weight:      Height:        Intake/Output Summary (Last 24 hours) at 07/07/2022 1137 Last data filed at 07/07/2022 0849 Gross per 24 hour  Intake 7197.68 ml  Output 5875 ml  Net 1322.68 ml   Filed Weights   07/03/22 0519 07/05/22 2121  Weight: 99.7 kg 98.7 kg    Telemetry    Sinus rhythm- Personally Reviewed  ECG    None today - Personally Reviewed  Physical Exam     General: Comfortable, sitting up in a chair Head: Atraumatic, normal size  Eyes: PEERLA, EOMI  Neck: Supple, normal JVD Cardiac: Normal S1, S2; RRR; no murmurs, rubs, or gallops Lungs: Clear to auscultation bilaterally Abd: Soft, nontender, no hepatomegaly  Ext: warm, no edema Musculoskeletal: No deformities, BUE and BLE strength normal and equal Skin: Warm and dry, no rashes    Neuro: Alert and oriented to person, place, time, and situation, CNII-XII grossly intact, no focal deficits  Psych: Normal mood and affect   Labs    Chemistry Recent Labs  Lab 07/05/22 0518 07/05/22 1829 07/06/22 0249 07/07/22 0307  NA 136 135 135 134*  K 4.2 4.7 4.7 3.9  CL 105  --  104 105  CO2 24  --  23 21*  GLUCOSE 112*  --  156* 108*  BUN 14  --  25* 29*  CREATININE 0.94  --  1.68* 1.93*  CALCIUM 8.3*  --  8.5* 8.1*  PROT  --   --  6.1* 5.8*  ALBUMIN  --   --  3.4* 3.3*  AST  --   --  21 19  ALT  --   --  14 8  ALKPHOS  --   --  39 35*  BILITOT  --   --  0.4 0.6  GFRNONAA >60  --  42* 36*  ANIONGAP 7  --  8 8     Hematology Recent Labs  Lab 07/05/22 2038 07/06/22 0249 07/06/22 1544 07/07/22 0307  WBC 8.8 9.2  --  9.4  RBC 2.67* 2.49*  --  2.32*  HGB 8.2* 7.7* 7.7* 7.0*  HCT 25.5* 23.7* 24.4* 23.6*  MCV 95.5 95.2  --  101.7*  MCH 30.7 30.9  --  30.2  MCHC 32.2 32.5  --  29.7*  RDW 14.3 14.4  --  14.7  PLT 151 142*  --  146*    Cardiac EnzymesNo results for input(s): "TROPONINI" in the last 168 hours. No results for input(s): "TROPIPOC" in the last 168 hours.   BNPNo results for input(s): "BNP", "PROBNP" in the last 168 hours.   DDimer No results for input(s): "DDIMER" in the last 168 hours.   Radiology    CT HEMATURIA WORKUP  Result Date: 07/06/2022 CLINICAL DATA:  Gross hematuria. Status cystoscopy and clot evacuation. Fulguration. EXAM: CT ABDOMEN AND PELVIS WITHOUT AND WITH CONTRAST TECHNIQUE: Multidetector CT imaging of the abdomen and pelvis was performed following the standard protocol before and following the bolus administration of intravenous contrast. RADIATION DOSE REDUCTION: This exam was performed according to the departmental dose-optimization program which includes automated exposure control, adjustment of the mA and/or kV according to patient size and/or use of iterative reconstruction technique. CONTRAST:  85m OMNIPAQUE IOHEXOL 300  MG/ML  SOLN COMPARISON:  Prostate MRI 06/24/2022 FINDINGS: Lower chest: Lung bases are clear. Hepatobiliary: No focal hepatic lesion. Normal gallbladder. No biliary duct dilatation. Common bile duct is normal. Pancreas: Pancreas is normal. No ductal dilatation. No pancreatic inflammation. Spleen: Normal spleen Adrenals/urinary tract: Adrenal glands normal. No enhancing renal cortical lesion. Mild pelvicaliectasis and hydroureter on the RIGHT. No obstructing lesion identified. Bladder is collapsed from Foley catheter. Enlarged prostate gland again noted. Delayed imaging demonstrates contrast through the entirety of the LEFT ureter entering the bladder. No obstruction. There is delayed excretion and no significant contrast within the mildly dilated RIGHT ureter. Stomach/Bowel: The stomach, duodenum, and small bowel normal. The colon and rectosigmoid colon are normal. Vascular/Lymphatic: Abdominal aorta normal caliber. Multiple small lymph nodes in the operator space is. Small lymph node in the LEFT perirectal fat measuring 5 mm on image 83/4) Reproductive: Prostatomegaly.  Foley catheter in place. Other: No free fluid. Musculoskeletal: No aggressive osseous lesion. IMPRESSION: 1. Mild pelvicaliectasis and hydroureter on the RIGHT. No obstructing lesion identified. Potential debris within the RIGHT ureter. RIGHT ureter not opacified on postcontrast delayed imaging. 2. Bladder collapsed from Foley catheter. 3. Prostatomegaly. 4. Small lymph nodes in the operator space and LEFT perirectal fat. Electronically Signed   By: SSuzy BouchardM.D.   On: 07/06/2022 12:50   [place]NAL  Result Date: 07/05/2022 CLINICAL DATA:  Gross hematuria EXAM: RENAL / URINARY TRACT ULTRASOUND COMPLETE COMPARISON:  CT abdomen 09/09/2010 FINDINGS: Right Kidney: Renal measurements: 11.2 by 6.2 by 4.8 cm = volume: 176 mL. Echogenicity within normal limits. No mass or hydronephrosis visualized. Left Kidney: Renal measurements: 11.5 by 7.0 by 4.7  cm = volume: 199 mL. Echogenicity within normal limits. No mass or hydronephrosis visualized. Bladder: Foley catheter noted in collapsed urinary bladder, heterogeneous masslike appearance around the catheter could be from clot, tumor, or extensive wall thickening from neurogenic bladder. Other: Prostatomegaly suspected. IMPRESSION: 1. No hydronephrosis. 2. Foley catheter noted in collapsed urinary bladder, heterogeneous masslike appearance around the catheter could be from intraluminal clot, tumor, or extensive wall thickening from neurogenic bladder. Cystoscopy may be indicated. 3. Prostatomegaly. Electronically Signed   By: WVan ClinesM.D.   On: 07/05/2022 18:04    Cardiac Studies   Echocardiogram 07/04/22   1. No obvious vegetation though cannot completely exclude Recommend TEE if  clinically indicated.   2. Left ventricular ejection fraction, by estimation,  is 35%. The left  ventricle demonstrates global hypokinesis. There is mild concentric left  ventricular hypertrophy.   3. Right ventricular systolic function is normal. The right ventricular  size is normal.   4. Trivial mitral valve regurgitation.   5. The aortic valve is tricuspid. Aortic valve regurgitation is trivial.   Patient Profile     77 y.o. 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 Recent diagnosis of heart failure with depressed ef - plan to use medical therapy for now given current clinical picture with hematuria/pending prostate biopsy and bacteremia. No further ischemic eval in this inpatient setting. Thankfully he has no angina symptoms.    His GDMT hads been held due to hypotension - plan to reinitiate once blood pressure improves.  He is planned for TEE today for his MSSA bacteremia.  He is  aware of this.  Shared Decision Making/Informed Consent The risks [esophageal damage, perforation (1:10,000 risk), bleeding, pharyngeal hematoma as well as other potential complications associated with conscious sedation including aspiration, arrhythmia, respiratory failure and death], benefits (treatment guidance and diagnostic support) and alternatives of a transesophageal echocardiogram were discussed in detail with Mr. Helmandollar and he is willing to proceed.    For questions or updates, please contact [place] Please consult [email or web address] for contact info under Cardiology/STEMI.      Signed, Berniece Salines, DO  07/07/2022, 11:37 AM

## 2022-07-08 DIAGNOSIS — N3001 Acute cystitis with hematuria: Secondary | ICD-10-CM | POA: Diagnosis not present

## 2022-07-08 DIAGNOSIS — D62 Acute posthemorrhagic anemia: Secondary | ICD-10-CM | POA: Diagnosis not present

## 2022-07-08 DIAGNOSIS — B9561 Methicillin susceptible Staphylococcus aureus infection as the cause of diseases classified elsewhere: Secondary | ICD-10-CM | POA: Diagnosis not present

## 2022-07-08 DIAGNOSIS — N179 Acute kidney failure, unspecified: Secondary | ICD-10-CM | POA: Diagnosis not present

## 2022-07-08 DIAGNOSIS — R7881 Bacteremia: Secondary | ICD-10-CM | POA: Diagnosis not present

## 2022-07-08 LAB — BPAM RBC
Blood Product Expiration Date: 202403292359
ISSUE DATE / TIME: 202403070922
Unit Type and Rh: 6200

## 2022-07-08 LAB — CBC
HCT: 23.9 % — ABNORMAL LOW (ref 39.0–52.0)
Hemoglobin: 7.9 g/dL — ABNORMAL LOW (ref 13.0–17.0)
MCH: 30.7 pg (ref 26.0–34.0)
MCHC: 33.1 g/dL (ref 30.0–36.0)
MCV: 93 fL (ref 80.0–100.0)
Platelets: 145 10*3/uL — ABNORMAL LOW (ref 150–400)
RBC: 2.57 MIL/uL — ABNORMAL LOW (ref 4.22–5.81)
RDW: 15.1 % (ref 11.5–15.5)
WBC: 6.9 10*3/uL (ref 4.0–10.5)
nRBC: 0 % (ref 0.0–0.2)

## 2022-07-08 LAB — BASIC METABOLIC PANEL
Anion gap: 8 (ref 5–15)
BUN: 24 mg/dL — ABNORMAL HIGH (ref 8–23)
CO2: 23 mmol/L (ref 22–32)
Calcium: 8.1 mg/dL — ABNORMAL LOW (ref 8.9–10.3)
Chloride: 103 mmol/L (ref 98–111)
Creatinine, Ser: 1.73 mg/dL — ABNORMAL HIGH (ref 0.61–1.24)
GFR, Estimated: 41 mL/min — ABNORMAL LOW (ref >60)
Glucose, Bld: 102 mg/dL — ABNORMAL HIGH (ref 70–99)
Potassium: 3.9 mmol/L (ref 3.5–5.1)
Sodium: 134 mmol/L — ABNORMAL LOW (ref 135–145)

## 2022-07-08 LAB — TYPE AND SCREEN
ABO/RH(D): A POS
Antibody Screen: NEGATIVE
Unit division: 0

## 2022-07-08 MED ORDER — SODIUM CHLORIDE 0.9% FLUSH: 10.0000 mL | INTRAVENOUS | Status: DC | PRN

## 2022-07-08 MED ORDER — LINEZOLID 600 MG PO TABS: 600.0000 mg | ORAL_TABLET | Freq: Two times a day (BID) | ORAL | 0 refills | Status: AC

## 2022-07-08 MED ORDER — SODIUM CHLORIDE 0.9 % IV SOLN: INTRAVENOUS | Status: AC

## 2022-07-08 MED ORDER — SODIUM CHLORIDE 0.9 % IR SOLN
3000.0000 mL | Status: DC
  Administered 2022-07-08 – 2022-07-09 (×2): 3000 mL

## 2022-07-08 MED ORDER — SODIUM CHLORIDE 0.9% FLUSH
10.0000 mL | Freq: Two times a day (BID) | INTRAVENOUS | Status: DC
  Administered 2022-07-08 – 2022-07-09 (×4): 10 mL

## 2022-07-08 MED ORDER — CARVEDILOL 6.25 MG PO TABS
6.2500 mg | ORAL_TABLET | Freq: Two times a day (BID) | ORAL | Status: DC
  Administered 2022-07-08 – 2022-07-10 (×5): 6.25 mg via ORAL
  Filled 2022-07-08 (×5): qty 1

## 2022-07-08 MED ORDER — CEFAZOLIN IV (FOR PTA / DISCHARGE USE ONLY): 2.0000 g | Freq: Three times a day (TID) | INTRAVENOUS | 0 refills | Status: AC

## 2022-07-08 NOTE — TOC Progression Note (Signed)
Transition of Care [facility]) - Progression Note    Patient Details  Name: ADNAAN MOWAD MRN: [id number] Date of Birth: 1945-12-18  Transition of Care [facility]) CM/SW [place], LCSW Phone Number: 07/08/2022, 9:43 AM  Clinical Narrative:    Pt will d/c with IV abx , spoke to [facility] with [employer], she is following. CSW spoke with pt requiring Home Health recommendations. Pt has no preference in agency. Pt was accepted for [facility] thorough [facility]. TOC to follow.    Expected Discharge Plan: Home/Self Care Barriers to Discharge: Continued Medical Work up  Expected Discharge Plan and Services                                               Social Determinants of Health (SDOH) Interventions SDOH Screenings   Food Insecurity: No Food Insecurity (07/04/2022)  Housing: Low Risk  (07/04/2022)  Transportation Needs: No Transportation Needs (07/04/2022)  Utilities: Not At Risk (07/04/2022)  Tobacco Use: Low Risk  (07/07/2022)    Readmission Risk Interventions     No data to display

## 2022-07-08 NOTE — Progress Notes (Addendum)
1 Day Post-Op Subjective: Denies pain. Tolerating foley. Foley clear.  Objective: Vital signs in last 24 hours: Temp:  [97.7 F (36.5 C)-98.3 F (36.8 C)] 98 F (36.7 C) (03/08 0551) Pulse Rate:  [68-87] 83 (03/08 0551) Resp:  [14-22] 18 (03/08 0551) BP: (104-153)/(63-75) 142/71 (03/08 0551) SpO2:  [97 %-100 %] 97 % (03/08 0551)  Intake/Output from previous day: 03/07 0701 - 03/08 0700 In: 3638.1 [I.V.:203.1; Blood:337.5; IV Piggyback:97.5] Out: 4300 [Urine:4300] Intake/Output this shift: Total I/O In: -  Out: 550 [Urine:550]  Physical Exam:  General: Alert and oriented CV: RRR Lungs: Clear Abdomen: Soft, ND, NT Ext: NT, No erythema  Lab Results: Recent Labs    07/07/22 0307 07/07/22 1717 07/08/22 0453  HGB 7.0* 8.1* 7.9*  HCT 23.6* 25.2* 23.9*   BMET Recent Labs    07/07/22 0307 07/08/22 0453  NA 134* 134*  K 3.9 3.9  CL 105 103  CO2 21* 23  GLUCOSE 108* 102*  BUN 29* 24*  CREATININE 1.93* 1.73*  CALCIUM 8.1* 8.1*     Studies/Results: ECHO TEE  Result Date: 07/07/2022    TRANSESOPHOGEAL ECHO REPORT   Patient Name:   Steven Carter Date of Exam: 07/07/2022 Medical Rec #:  [id number]       Height:       73.0 in Accession #:    [id number]      Weight:       217.6 lb Date of Birth:  22-Aug-1945       BSA:          2.230 m Patient Age:    77 years        BP:           117/67 mmHg Patient Gender: M               HR:           89 bpm. Exam Location:  Inpatient Procedure: Transesophageal Echo and Color Doppler Indications:    Bacteremia  History:        Patient has prior history of Echocardiogram examinations, most                 recent 07/04/2022.  Sonographer:    Wilkie Aye RVT RCS Referring Phys: [id number] [place]: The transesophogeal probe was passed without difficulty through the esophogus of the patient. Sedation performed by different physician. The patient developed no complications during the procedure.  IMPRESSIONS  1. Left ventricular  ejection fraction, by estimation, is 40%. The left ventricle has moderately decreased function. The left ventricle demonstrates global hypokinesis.  2. Right ventricular systolic function is normal. The right ventricular size is normal.  3. No left atrial/left atrial appendage thrombus was detected.  4. The mitral valve is normal in structure. Trivial mitral valve regurgitation. No evidence of mitral stenosis.  5. The aortic valve is tricuspid. Aortic valve regurgitation is mild. No aortic stenosis is present. Conclusion(s)/Recommendation(s): No evidence of vegetation/infective endocarditis on this transesophageael echocardiogram. FINDINGS  Left Ventricle: Left ventricular ejection fraction, by estimation, is 40%. The left ventricle has moderately decreased function. The left ventricle demonstrates global hypokinesis. The left ventricular internal cavity size was normal in size. Right Ventricle: The right ventricular size is normal. No increase in right ventricular wall thickness. Right ventricular systolic function is normal. Left Atrium: Left atrial size was normal in size. No left atrial/left atrial appendage thrombus was detected. Right Atrium: Right atrial size was normal in size. Pericardium: Trivial pericardial  effusion is present. Mitral Valve: The mitral valve is normal in structure. Trivial mitral valve regurgitation. No evidence of mitral valve stenosis. There is no evidence of mitral valve vegetation. Tricuspid Valve: The tricuspid valve is normal in structure. Tricuspid valve regurgitation is trivial. No evidence of tricuspid stenosis. There is no evidence of tricuspid valve vegetation. Aortic Valve: The aortic valve is tricuspid. Aortic valve regurgitation is mild. No aortic stenosis is present. There is no evidence of aortic valve vegetation. Pulmonic Valve: The pulmonic valve was grossly normal. Pulmonic valve regurgitation is trivial. No evidence of pulmonic stenosis. Aorta: The aortic root is  normal in size and structure. There is minimal (Grade I) plaque involving the descending aorta. IAS/Shunts: No atrial level shunt detected by color flow Doppler. Buford Dresser MD Electronically signed by Buford Dresser MD Signature Date/Time: 07/07/2022/4:14:01 PM    Final    CT HEMATURIA WORKUP  Result Date: 07/06/2022 CLINICAL DATA:  Gross hematuria. Status cystoscopy and clot evacuation. Fulguration. EXAM: CT ABDOMEN AND PELVIS WITHOUT AND WITH CONTRAST TECHNIQUE: Multidetector CT imaging of the abdomen and pelvis was performed following the standard protocol before and following the bolus administration of intravenous contrast. RADIATION DOSE REDUCTION: This exam was performed according to the departmental dose-optimization program which includes automated exposure control, adjustment of the mA and/or kV according to patient size and/or use of iterative reconstruction technique. CONTRAST:  35m OMNIPAQUE IOHEXOL 300 MG/ML  SOLN COMPARISON:  Prostate MRI 06/24/2022 FINDINGS: Lower chest: Lung bases are clear. Hepatobiliary: No focal hepatic lesion. Normal gallbladder. No biliary duct dilatation. Common bile duct is normal. Pancreas: Pancreas is normal. No ductal dilatation. No pancreatic inflammation. Spleen: Normal spleen Adrenals/urinary tract: Adrenal glands normal. No enhancing renal cortical lesion. Mild pelvicaliectasis and hydroureter on the RIGHT. No obstructing lesion identified. Bladder is collapsed from Foley catheter. Enlarged prostate gland again noted. Delayed imaging demonstrates contrast through the entirety of the LEFT ureter entering the bladder. No obstruction. There is delayed excretion and no significant contrast within the mildly dilated RIGHT ureter. Stomach/Bowel: The stomach, duodenum, and small bowel normal. The colon and rectosigmoid colon are normal. Vascular/Lymphatic: Abdominal aorta normal caliber. Multiple small lymph nodes in the operator space is. Small lymph node  in the LEFT perirectal fat measuring 5 mm on image 83/4) Reproductive: Prostatomegaly.  Foley catheter in place. Other: No free fluid. Musculoskeletal: No aggressive osseous lesion. IMPRESSION: 1. Mild pelvicaliectasis and hydroureter on the RIGHT. No obstructing lesion identified. Potential debris within the RIGHT ureter. RIGHT ureter not opacified on postcontrast delayed imaging. 2. Bladder collapsed from Foley catheter. 3. Prostatomegaly. 4. Small lymph nodes in the operator space and LEFT perirectal fat. Electronically Signed   By: SSuzy BouchardM.D.   On: 07/06/2022 12:50    Assessment/Plan: 1.         Gross hematuria: S/p cysto/clot evac/fulguration on 07/05/2022. Identified trauma in prostatic urethra and right bladder neck, also blood effluxing from RUO. Renal u/s 3/5 with no upper tract abnormality. CTU with mild right hydro and debris in ureter. 2.         UTI: Ucx 07/02/2022 >100K staph aureus 3.         Elevated PSA/PIRADS 4 prostate lesion on MRI 06/2022. Was scheduled for MRI fusion biopsy this week however this is postponed due to admission with UTI. 4.         BPH/retention: Developed urinary retention in 06/2022 requiring foley placement outpatient  -Keep foley to gravity. Place catheter plug into inflow port prior  to discharge. -Void trial outpatient next week. If fails, will replace smaller bore catheter. -Will require outpatient ureteroscopy and MRI fusion prostate biopsy.  -Ok to discharge home from GU standpoint. -Continue abx    LOS: 5 days   Matt R.  MD 07/08/2022, 9:00 AM [facility]  Pager: [phone]  Addendum:  I had a long conversation with the patient regarding his plan and again a long conversations with his wife this afternoon. Not uncommon to see transient hematuria with foley balloon in catheter. Plan is for void trial next Wendnesday which is scheduled. If fails, will likely require bladder outlet obstruction procedure. However, will need to obtain prostate  biopsy prior which was scheduled this week however delayed due to his admission with bacteremia. Will also need to schedule URS to rule out upper tract lesion however will need to delay given bacteremia. Will plan to assess next Wednesday and hopefully schedule on that date. All questions answered to her satisfaction. She has already requested for a second opinion which the primary team has arranged.  Matt R. [facility]  Pager: [phone]

## 2022-07-08 NOTE — Progress Notes (Signed)
Restarted CBI  per Dr Tresa Moore

## 2022-07-08 NOTE — Progress Notes (Signed)
Stopped CBI for pt and closed per Dr Loleta Books

## 2022-07-08 NOTE — Anesthesia Postprocedure Evaluation (Signed)
Anesthesia Post Note  Patient: Steven Carter  Procedure(s) Performed: TRANSESOPHAGEAL ECHOCARDIOGRAM (TEE)     Patient location during evaluation: PACU Anesthesia Type: MAC Level of consciousness: awake and alert Pain management: pain level controlled Vital Signs Assessment: post-procedure vital signs reviewed and stable Respiratory status: spontaneous breathing, nonlabored ventilation, respiratory function stable and patient connected to nasal cannula oxygen Cardiovascular status: stable and blood pressure returned to baseline Postop Assessment: no apparent nausea or vomiting Anesthetic complications: no   No notable events documented.  Last Vitals:  Vitals:   07/08/22 1815 07/08/22 2129  BP: (!) 168/85 (!) 164/74  Pulse: 90 83  Resp: 19 18  Temp: 36.4 C 36.8 C  SpO2: 100% 100%    Last Pain:  Vitals:   07/08/22 2129  TempSrc: Oral  PainSc:    Pain Goal: Patients Stated Pain Goal: 3 (07/07/22 1710)                 Tiajuana Amass

## 2022-07-08 NOTE — Care Management Important Message (Signed)
Important Message  Patient Details IM Letter given. Name: ANAN HOARD MRN: [id number] Date of Birth: [birth year]   Medicare Important Message Given:  Yes     Kerin Salen 07/08/2022, 10:05 AM

## 2022-07-08 NOTE — Progress Notes (Signed)
Assessed at bedside. Urine is clear yellow with CBI now clamped for 2 days. Tiny clot in the bag.   Long conversation with patient and his wife regarding her concerns. I do think that at this time it is safe for discharge home with foley once home health is arranged.  Plan for void trial on Wednesday. He will continue flomax and finasteride outpatient.  Will need to reschedule MRI fusion biopsy after a period of antibiotics as well as schedule diagnostic ureteroscopy.   All questions answered to their satisfaction.  Matt R. [facility]  Pager: [phone]

## 2022-07-08 NOTE — Progress Notes (Signed)
Heart Failure Navigator Progress Note  Assessed for Heart & Vascular TOC clinic readiness.  Patient EF 35% Per MD note: . Recent diagnosis of heart failure with depressed ef - plan to use medical therapy for now given current clinical picture with hematuria/pending prostate biopsy and bacteremia . Patient with a hospital follow up on 07/19/2022.  Navigator available for reassessment of patient.   Earnestine Leys, BSN, Clinical cytogeneticist Only

## 2022-07-08 NOTE — Progress Notes (Signed)
Called by NSG that catheter clotted off irrigation. Pt seen and examined. Some formed blood around catheter as well suggets mostly prostate / uretral source. Catheter placed on gentle catheter-strap traction and CBI restarted at 1gtt/second rate. Pt and NSG updated on importance of traction to tamponade likely source.

## 2022-07-08 NOTE — Progress Notes (Signed)
Progress Note  Patient Name: Steven Carter Date of Encounter: 07/08/2022  Primary Cardiologist: Berniece Salines, DO   Subjective   Patient seen examined his bedside.  Status post TEE no evidence of endocarditis..  Inpatient Medications    Scheduled Meds:  sodium chloride   Intravenous Once   Chlorhexidine Gluconate Cloth  6 each Topical QHS   docusate sodium  100 mg Oral BID   finasteride  5 mg Oral BID   levothyroxine  112 mcg Oral Daily   oxybutynin  5 mg Oral Daily   polyethylene glycol  17 g Oral Daily   tamsulosin  0.4 mg Oral BID   Continuous Infusions:  sodium chloride Stopped (07/05/22 2210)   sodium chloride      ceFAZolin (ANCEF) IV 2 g (07/07/22 2307)   sodium chloride irrigation     PRN Meds: sodium chloride, acetaminophen **OR** acetaminophen, hydrALAZINE, HYDROmorphone (DILAUDID) injection, ondansetron **OR** ondansetron (ZOFRAN) IV, mouth rinse, oxybutynin   Vital Signs    Vitals:   07/07/22 1830 07/07/22 2220 07/08/22 0140 07/08/22 0551  BP: 133/68 135/73 135/68 (!) 142/71  Pulse: 77 84 86 83  Resp: '14  16 18  '$ Temp: 97.9 F (36.6 C) 98 F (36.7 C) 98.3 F (36.8 C) 98 F (36.7 C)  TempSrc: Oral Oral Oral Oral  SpO2: 97% 98% 97% 97%  Weight:      Height:        Intake/Output Summary (Last 24 hours) at 07/08/2022 0905 Last data filed at 07/08/2022 0818 Gross per 24 hour  Intake 437.5 ml  Output 3925 ml  Net -3487.5 ml   Filed Weights   07/03/22 0519 07/05/22 2121  Weight: 99.7 kg 98.7 kg    Telemetry    Sinus rhythm- Personally Reviewed  ECG    None today - Personally Reviewed  Physical Exam     General: Comfortable, sitting up in a chair Head: Atraumatic, normal size  Eyes: PEERLA, EOMI  Neck: Supple, normal JVD Cardiac: Normal S1, S2; RRR; no murmurs, rubs, or gallops Lungs: Clear to auscultation bilaterally Abd: Soft, nontender, no hepatomegaly  Ext: warm, no edema Musculoskeletal: No deformities, BUE and BLE strength normal  and equal Skin: Warm and dry, no rashes   Neuro: Alert and oriented to person, place, time, and situation, CNII-XII grossly intact, no focal deficits  Psych: Normal mood and affect   Labs    Chemistry Recent Labs  Lab 07/06/22 0249 07/07/22 0307 07/08/22 0453  NA 135 134* 134*  K 4.7 3.9 3.9  CL 104 105 103  CO2 23 21* 23  GLUCOSE 156* 108* 102*  BUN 25* 29* 24*  CREATININE 1.68* 1.93* 1.73*  CALCIUM 8.5* 8.1* 8.1*  PROT 6.1* 5.8*  --   ALBUMIN 3.4* 3.3*  --   AST 21 19  --   ALT 14 8  --   ALKPHOS 39 35*  --   BILITOT 0.4 0.6  --   GFRNONAA 42* 36* 41*  ANIONGAP '8 8 8     '$ Hematology Recent Labs  Lab 07/06/22 0249 07/06/22 1544 07/07/22 0307 07/07/22 1717 07/08/22 0453  WBC 9.2  --  9.4  --  6.9  RBC 2.49*  --  2.32*  --  2.57*  HGB 7.7*   < > 7.0* 8.1* 7.9*  HCT 23.7*   < > 23.6* 25.2* 23.9*  MCV 95.2  --  101.7*  --  93.0  MCH 30.9  --  30.2  --  30.7  MCHC 32.5  --  29.7*  --  33.1  RDW 14.4  --  14.7  --  15.1  PLT 142*  --  146*  --  145*   < > = values in this interval not displayed.    Cardiac EnzymesNo results for input(s): "TROPONINI" in the last 168 hours. No results for input(s): "TROPIPOC" in the last 168 hours.   BNPNo results for input(s): "BNP", "PROBNP" in the last 168 hours.   DDimer No results for input(s): "DDIMER" in the last 168 hours.   Radiology    ECHO TEE  Result Date: 07/07/2022    TRANSESOPHOGEAL ECHO REPORT   Patient Name:   Steven Carter Date of Exam: 07/07/2022 Medical Rec #:  [id number]       Height:       73.0 in Accession #:    [id number]      Weight:       217.6 lb Date of Birth:  12/12/1945       BSA:          2.230 m Patient Age:    77 years        BP:           117/67 mmHg Patient Gender: M               HR:           89 bpm. Exam Location:  Inpatient Procedure: Transesophageal Echo and Color Doppler Indications:    Bacteremia  History:        Patient has prior history of Echocardiogram examinations, most                  recent 07/04/2022.  Sonographer:    Wilkie Aye RVT RCS Referring Phys: [id number] [place]: The transesophogeal probe was passed without difficulty through the esophogus of the patient. Sedation performed by different physician. The patient developed no complications during the procedure.  IMPRESSIONS  1. Left ventricular ejection fraction, by estimation, is 40%. The left ventricle has moderately decreased function. The left ventricle demonstrates global hypokinesis.  2. Right ventricular systolic function is normal. The right ventricular size is normal.  3. No left atrial/left atrial appendage thrombus was detected.  4. The mitral valve is normal in structure. Trivial mitral valve regurgitation. No evidence of mitral stenosis.  5. The aortic valve is tricuspid. Aortic valve regurgitation is mild. No aortic stenosis is present. Conclusion(s)/Recommendation(s): No evidence of vegetation/infective endocarditis on this transesophageael echocardiogram. FINDINGS  Left Ventricle: Left ventricular ejection fraction, by estimation, is 40%. The left ventricle has moderately decreased function. The left ventricle demonstrates global hypokinesis. The left ventricular internal cavity size was normal in size. Right Ventricle: The right ventricular size is normal. No increase in right ventricular wall thickness. Right ventricular systolic function is normal. Left Atrium: Left atrial size was normal in size. No left atrial/left atrial appendage thrombus was detected. Right Atrium: Right atrial size was normal in size. Pericardium: Trivial pericardial effusion is present. Mitral Valve: The mitral valve is normal in structure. Trivial mitral valve regurgitation. No evidence of mitral valve stenosis. There is no evidence of mitral valve vegetation. Tricuspid Valve: The tricuspid valve is normal in structure. Tricuspid valve regurgitation is trivial. No evidence of tricuspid stenosis. There is no evidence of tricuspid  valve vegetation. Aortic Valve: The aortic valve is tricuspid. Aortic valve regurgitation is mild. No aortic stenosis is present. There is no evidence of aortic valve vegetation. Pulmonic  Valve: The pulmonic valve was grossly normal. Pulmonic valve regurgitation is trivial. No evidence of pulmonic stenosis. Aorta: The aortic root is normal in size and structure. There is minimal (Grade I) plaque involving the descending aorta. IAS/Shunts: No atrial level shunt detected by color flow Doppler. Buford Dresser MD Electronically signed by Buford Dresser MD Signature Date/Time: 07/07/2022/4:14:01 PM    Final    CT HEMATURIA WORKUP  Result Date: 07/06/2022 CLINICAL DATA:  Gross hematuria. Status cystoscopy and clot evacuation. Fulguration. EXAM: CT ABDOMEN AND PELVIS WITHOUT AND WITH CONTRAST TECHNIQUE: Multidetector CT imaging of the abdomen and pelvis was performed following the standard protocol before and following the bolus administration of intravenous contrast. RADIATION DOSE REDUCTION: This exam was performed according to the departmental dose-optimization program which includes automated exposure control, adjustment of the mA and/or kV according to patient size and/or use of iterative reconstruction technique. CONTRAST:  32m OMNIPAQUE IOHEXOL 300 MG/ML  SOLN COMPARISON:  Prostate MRI 06/24/2022 FINDINGS: Lower chest: Lung bases are clear. Hepatobiliary: No focal hepatic lesion. Normal gallbladder. No biliary duct dilatation. Common bile duct is normal. Pancreas: Pancreas is normal. No ductal dilatation. No pancreatic inflammation. Spleen: Normal spleen Adrenals/urinary tract: Adrenal glands normal. No enhancing renal cortical lesion. Mild pelvicaliectasis and hydroureter on the RIGHT. No obstructing lesion identified. Bladder is collapsed from Foley catheter. Enlarged prostate gland again noted. Delayed imaging demonstrates contrast through the entirety of the LEFT ureter entering the bladder. No  obstruction. There is delayed excretion and no significant contrast within the mildly dilated RIGHT ureter. Stomach/Bowel: The stomach, duodenum, and small bowel normal. The colon and rectosigmoid colon are normal. Vascular/Lymphatic: Abdominal aorta normal caliber. Multiple small lymph nodes in the operator space is. Small lymph node in the LEFT perirectal fat measuring 5 mm on image 83/4) Reproductive: Prostatomegaly.  Foley catheter in place. Other: No free fluid. Musculoskeletal: No aggressive osseous lesion. IMPRESSION: 1. Mild pelvicaliectasis and hydroureter on the RIGHT. No obstructing lesion identified. Potential debris within the RIGHT ureter. RIGHT ureter not opacified on postcontrast delayed imaging. 2. Bladder collapsed from Foley catheter. 3. Prostatomegaly. 4. Small lymph nodes in the operator space and LEFT perirectal fat. Electronically Signed   By: SSuzy BouchardM.D.   On: 07/06/2022 12:50    Cardiac Studies   Echocardiogram 07/04/22   1. No obvious vegetation though cannot completely exclude Recommend TEE if  clinically indicated.   2. Left ventricular ejection fraction, by estimation, is 35%. The left  ventricle demonstrates global hypokinesis. There is mild concentric left  ventricular hypertrophy.   3. Right ventricular systolic function is normal. The right ventricular  size is normal.   4. Trivial mitral valve regurgitation.   5. The aortic valve is tricuspid. Aortic valve regurgitation is trivial.   Patient Profile     77y.o. 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  Assessment & Plan    Acute systolic heart failure Dilated cardiomyopathy EF 35% MSSA bacteremia   Recent diagnosis of heart failure with depressed ef - plan to use medical therapy for now given current clinical picture in the setting of his recent with hematuria/pending prostate biopsy and bacteremia. No further ischemic eval  in this inpatient setting. Thankfully he has no angina symptoms.    His GDMT hads been held due to hypotension, now blood pressure has recovered nicely we will restart his GDMT will start Coreg 6.25 mg twice daily today.  Once kidney  function started to improve Entresto, Aldactone can be  Creatinine has improved from yesterday 1.93 to 1.73 today His TEE yesterday did not show any evidence of endocarditis.  Status post PRBC transfusion hemoglobin stable 7.9.    For questions or updates, please contact [place] Please consult [email or web address] for contact info under Cardiology/STEMI.      Signed,  , DO  07/08/2022, 9:05 AM

## 2022-07-08 NOTE — Progress Notes (Signed)
  Progress Note   Patient: Steven Carter [id number] DOB: 18-Mar-1946 DOA: 07/02/2022     5 DOS: the patient was seen and examined on 07/08/2022 at 9:49 AM      Brief hospital course: Steven Carter is a 77 y.o. M with hypothyroidism and recently diagnosed urinary retention who presented with pelvic pain and inability to urinate.    After admission, incidentally found to have MSSA bacteremia      Assessment and Plan: * MSSA bacteremia TEE negative - Continue cefazolin - Place midline for outpatient antibiotics    AKI (acute kidney injury) ([place]) Creatinine slightly down to 1.7 yesterday with blood transfusion - IV fluids - Trend creatinine     Acute blood loss anemia Hemoglobin stable at 7.9 today  Gross hematuria - CBI and foley per Urology     Chronic systolic CHF (congestive heart failure) ([facility]) -Monitor blood pressure on new Coreg  Hypothyroidism - Continue levothyroxine  Benign prostatic hyperplasia - Continue flomax and finasteride          Subjective: TEE yesterday showed no vegetations.  Had some hematuria this morning after urology saw him.  No abdominal pain, no dizziness, no confusion, no respiratory symptoms, no swelling.     Physical Exam: BP (!) 142/71 (BP Location: Left Arm)   Pulse 83   Temp 98 F (36.7 C) (Oral)   Resp 18   Ht 6\' 1"  (1.854 m)   Wt 98.7 kg   SpO2 97%   BMI 28.71 kg/m   Adult male, sitting in recliner, interactive and appropriate RRR, no murmurs, no peripheral edema Respiratory rate normal, lungs clear without rales or wheezes Abdomen soft without tenderness palpation or guarding, no ascites or distention Attention normal, affect normal, judgment insight appear normal Small amount of hematuria gross hematuria in his Foley bag    Data Reviewed: Discussed with urology Hemoglobin stable at 7.9, creatinine slightly down to 1.7 TEE shows no vegetations, confirms reduced EF  Family Communication: Wife by  phone    Disposition: Status is: Inpatient Patient is overall improving  If his creatinine continues to improve tomorrow, if his hemoglobin remained stable, and if urology have no further plans for his hematuria, likely home tomorrow with IV antibiotics        Author: Edwin Dada, MD 07/08/2022 11:54 AM  For on call review [email or web address].

## 2022-07-09 ENCOUNTER — Inpatient Hospital Stay (HOSPITAL_COMMUNITY): Payer: Medicare Other

## 2022-07-09 DIAGNOSIS — B9561 Methicillin susceptible Staphylococcus aureus infection as the cause of diseases classified elsewhere: Secondary | ICD-10-CM | POA: Diagnosis not present

## 2022-07-09 DIAGNOSIS — I5021 Acute systolic (congestive) heart failure: Secondary | ICD-10-CM

## 2022-07-09 DIAGNOSIS — R7881 Bacteremia: Secondary | ICD-10-CM | POA: Diagnosis not present

## 2022-07-09 LAB — CULTURE, BLOOD (ROUTINE X 2)
Culture: NO GROWTH
Culture: NO GROWTH
Special Requests: ADEQUATE
Special Requests: ADEQUATE

## 2022-07-09 LAB — CBC
HCT: 23.4 % — ABNORMAL LOW (ref 39.0–52.0)
Hemoglobin: 7.9 g/dL — ABNORMAL LOW (ref 13.0–17.0)
MCH: 31.3 pg (ref 26.0–34.0)
MCHC: 33.8 g/dL (ref 30.0–36.0)
MCV: 92.9 fL (ref 80.0–100.0)
Platelets: 142 10*3/uL — ABNORMAL LOW (ref 150–400)
RBC: 2.52 MIL/uL — ABNORMAL LOW (ref 4.22–5.81)
RDW: 14.6 % (ref 11.5–15.5)
WBC: 8.8 10*3/uL (ref 4.0–10.5)
nRBC: 0 % (ref 0.0–0.2)

## 2022-07-09 LAB — BASIC METABOLIC PANEL
Anion gap: 6 (ref 5–15)
BUN: 14 mg/dL (ref 8–23)
CO2: 24 mmol/L (ref 22–32)
Calcium: 7.9 mg/dL — ABNORMAL LOW (ref 8.9–10.3)
Chloride: 102 mmol/L (ref 98–111)
Creatinine, Ser: 0.89 mg/dL (ref 0.61–1.24)
GFR, Estimated: >60 mL/min (ref >60)
Glucose, Bld: 124 mg/dL — ABNORMAL HIGH (ref 70–99)
Potassium: 3.8 mmol/L (ref 3.5–5.1)
Sodium: 132 mmol/L — ABNORMAL LOW (ref 135–145)

## 2022-07-09 MED ORDER — SPIRONOLACTONE 12.5 MG HALF TABLET
12.5000 mg | ORAL_TABLET | Freq: Every day | ORAL | Status: DC
  Administered 2022-07-09 – 2022-07-10 (×2): 12.5 mg via ORAL
  Filled 2022-07-09 (×2): qty 1

## 2022-07-09 MED ORDER — LOSARTAN POTASSIUM 25 MG PO TABS
25.0000 mg | ORAL_TABLET | Freq: Every day | ORAL | Status: DC
  Administered 2022-07-09 – 2022-07-10 (×2): 25 mg via ORAL
  Filled 2022-07-09 (×2): qty 1

## 2022-07-09 NOTE — Progress Notes (Signed)
Mobility Specialist - Progress Note   07/09/22 1102  Mobility  Activity Ambulated with assistance in hallway  Level of Assistance Standby assist, set-up cues, supervision of patient - no hands on  Assistive Device Other (Comment) (HHA)  Distance Ambulated (ft) 350 ft  Activity Response Tolerated well  Mobility Referral Yes  $Mobility charge 1 Mobility   Pt received in recliner and agreeable to mobility. Pt held on to wall rails throughout ambulation due to some unsteadiness when turning his head. No complaints during session. Pt to recliner after session with all needs met w/ DO in room.   [facility]

## 2022-07-09 NOTE — Progress Notes (Cosign Needed Addendum)
S/p Cystourethroscopy, clot evacuation with fulguration on 07/05/22.  Subjective: No acute events overnight. Urine this morning clear yellow on very slow gtt CBI. Catheter remains on traction. Hgb stable 7.9.  Objective: Vital signs in last 24 hours: Temp:  [97.6 F (36.4 C)-98.4 F (36.9 C)] 98.4 F (36.9 C) (03/09 0631) Pulse Rate:  [75-90] 75 (03/09 0631) Resp:  [18-19] 18 (03/09 0631) BP: (149-168)/(73-85) 152/73 (03/09 0631) SpO2:  [95 %-100 %] 95 % (03/09 0631)  Intake/Output from previous day: 03/08 0701 - 03/09 0700 In: 6377.6 [P.O.:720; I.V.:957.6; IV Piggyback:500] Out: 4301 [Urine:4300; Stool:1] Intake/Output this shift: Total I/O In: 10 [I.V.:10] Out: -   Physical Exam:  General: Alert and oriented CV: Regular rate Lungs: Normal work of breathing on room air Abdomen: Soft, ND, NT GU: Catheter in place to gravity drainage. Urine clear yellow with slow gtt CBI Ext: NT, No erythema  Lab Results: Recent Labs    07/07/22 1717 07/08/22 0453 07/09/22 0208  HGB 8.1* 7.9* 7.9*  HCT 25.2* 23.9* 23.4*    BMET Recent Labs    07/08/22 0453 07/09/22 0208  NA 134* 132*  K 3.9 3.8  CL 103 102  CO2 23 24  GLUCOSE 102* 124*  BUN 24* 14  CREATININE 1.73* 0.89  CALCIUM 8.1* 7.9*      Studies/Results: ECHO TEE  Result Date: 07/07/2022    TRANSESOPHOGEAL ECHO REPORT   Patient Name:   Steven Carter Date of Exam: 07/07/2022 Medical Rec #:  [id number]       Height:       73.0 in Accession #:    [id number]      Weight:       217.6 lb Date of Birth:  [birth year]       BSA:          2.230 m Patient Age:    77 years        BP:           117/67 mmHg Patient Gender: M               HR:           89 bpm. Exam Location:  Inpatient Procedure: Transesophageal Echo and Color Doppler Indications:    Bacteremia  History:        Patient has prior history of Echocardiogram examinations, most                 recent 07/04/2022.  Sonographer:    Wilkie Aye RVT RCS Referring Phys: [id number]  [place]: The transesophogeal probe was passed without difficulty through the esophogus of the patient. Sedation performed by different physician. The patient developed no complications during the procedure.  IMPRESSIONS  1. Left ventricular ejection fraction, by estimation, is 40%. The left ventricle has moderately decreased function. The left ventricle demonstrates global hypokinesis.  2. Right ventricular systolic function is normal. The right ventricular size is normal.  3. No left atrial/left atrial appendage thrombus was detected.  4. The mitral valve is normal in structure. Trivial mitral valve regurgitation. No evidence of mitral stenosis.  5. The aortic valve is tricuspid. Aortic valve regurgitation is mild. No aortic stenosis is present. Conclusion(s)/Recommendation(s): No evidence of vegetation/infective endocarditis on this transesophageael echocardiogram. FINDINGS  Left Ventricle: Left ventricular ejection fraction, by estimation, is 40%. The left ventricle has moderately decreased function. The left ventricle demonstrates global hypokinesis. The left ventricular internal cavity size was normal in size. Right Ventricle: The right ventricular  size is normal. No increase in right ventricular wall thickness. Right ventricular systolic function is normal. Left Atrium: Left atrial size was normal in size. No left atrial/left atrial appendage thrombus was detected. Right Atrium: Right atrial size was normal in size. Pericardium: Trivial pericardial effusion is present. Mitral Valve: The mitral valve is normal in structure. Trivial mitral valve regurgitation. No evidence of mitral valve stenosis. There is no evidence of mitral valve vegetation. Tricuspid Valve: The tricuspid valve is normal in structure. Tricuspid valve regurgitation is trivial. No evidence of tricuspid stenosis. There is no evidence of tricuspid valve vegetation. Aortic Valve: The aortic valve is tricuspid. Aortic valve  regurgitation is mild. No aortic stenosis is present. There is no evidence of aortic valve vegetation. Pulmonic Valve: The pulmonic valve was grossly normal. Pulmonic valve regurgitation is trivial. No evidence of pulmonic stenosis. Aorta: The aortic root is normal in size and structure. There is minimal (Grade I) plaque involving the descending aorta. IAS/Shunts: No atrial level shunt detected by color flow Doppler. Buford Dresser MD Electronically signed by Buford Dresser MD Signature Date/Time: 07/07/2022/4:14:01 PM    Final     Assessment/Plan: 1.         Gross hematuria: S/p cysto/clot evac/fulguration on 07/05/2022. Identified trauma in prostatic urethra and right bladder neck, also blood effluxing from RUO. Renal u/s 3/5 with no upper tract abnormality. CTU with mild right hydro and debris in ureter. 2.         UTI: Ucx 07/02/2022 >100K staph aureus 3.         Elevated PSA/PIRADS 4 prostate lesion on MRI 06/2022. Was scheduled for MRI fusion biopsy this week however this is postponed due to admission with UTI. 4.         BPH/retention: Developed urinary retention in 06/2022 requiring foley placement outpatient  -Keep foley to gravity. CBI clamped this morning. Will continue to monitor -Void trial outpatient next week. If fails, will replace smaller bore catheter. -Will require outpatient ureteroscopy and MRI fusion prostate biopsy.  -Continue abx    LOS: 6 days   I have seen and examined the patient and agree with Dr. Danella Sensing plan.  S: S/p cysto clot evac for mostely prostate fossa bleeding.  O: Bleeding neearly resolved in urine and around catheter with light traction. Hgb sable.  A/P: Keep foley but off irrigation. Pt OK for DC from Urol perspective at anytime.

## 2022-07-09 NOTE — Progress Notes (Signed)
Mobility Specialist - Progress Note   07/09/22 1458  Mobility  Activity Ambulated with assistance in hallway  Level of Assistance Modified independent, requires aide device or extra time  Assistive Device Other (Comment) (IV pole)  Distance Ambulated (ft) 350 ft  Activity Response Tolerated well  Mobility Referral Yes  $Mobility charge 1 Mobility   Pt received in recliner and agreeable to mobility. No complaints during session. Pt to recliner after session with all needs met & call bell in reach.   [facility]

## 2022-07-09 NOTE — Progress Notes (Signed)
Rounding Note    Patient Name: Steven Carter Date of Encounter: 07/09/2022  [facility] Cardiologist: Berniece Salines, DO   Subjective   No complaints  Inpatient Medications    Scheduled Meds:  sodium chloride   Intravenous Once   carvedilol  6.25 mg Oral BID WC   Chlorhexidine Gluconate Cloth  6 each Topical QHS   docusate sodium  100 mg Oral BID   finasteride  5 mg Oral BID   levothyroxine  112 mcg Oral Daily   oxybutynin  5 mg Oral Daily   polyethylene glycol  17 g Oral Daily   sodium chloride flush  10-40 mL Intracatheter Q12H   tamsulosin  0.4 mg Oral BID   Continuous Infusions:  sodium chloride 10 mL/hr (07/09/22 0532)    ceFAZolin (ANCEF) IV 2 g (07/09/22 0847)   sodium chloride irrigation     sodium chloride irrigation     PRN Meds: sodium chloride, acetaminophen **OR** acetaminophen, hydrALAZINE, HYDROmorphone (DILAUDID) injection, ondansetron **OR** ondansetron (ZOFRAN) IV, mouth rinse, oxybutynin, sodium chloride flush   Vital Signs    Vitals:   07/08/22 1209 07/08/22 1815 07/08/22 2129 07/09/22 0631  BP: (!) 149/77 (!) 168/85 (!) 164/74 (!) 152/73  Pulse: 84 90 83 75  Resp: '18 19 18 18  '$ Temp: 98.1 F (36.7 C) 97.6 F (36.4 C) 98.3 F (36.8 C) 98.4 F (36.9 C)  TempSrc: Oral Oral Oral Oral  SpO2: 100% 100% 100% 95%  Weight:      Height:        Intake/Output Summary (Last 24 hours) at 07/09/2022 1049 Last data filed at 07/09/2022 0900 Gross per 24 hour  Intake 6387.63 ml  Output 3750 ml  Net 2637.63 ml      07/05/2022    9:21 PM 07/03/2022    5:19 AM  Last 3 Weights  Weight (lbs) 217 lb 9.5 oz 219 lb 12.8 oz  Weight (kg) 98.7 kg 99.701 kg      Telemetry    N/a - Personally Reviewed  ECG    N/a - Personally Reviewed  Physical Exam   GEN: No acute distress.   Neck: No JVD Cardiac: RRR, no murmurs, rubs, or gallops.  Respiratory: Clear to auscultation bilaterally. GI: Soft, nontender, non-distended  MS: No edema; No  deformity. Neuro:  Nonfocal  Psych: Normal affect   Labs    High Sensitivity Troponin:  No results for input(s): "TROPONINIHS" in the last 720 hours.   Chemistry Recent Labs  Lab 07/04/22 0448 07/05/22 0518 07/05/22 1829 07/06/22 0249 07/07/22 0307 07/08/22 0453 07/09/22 0208  NA 136 136   < > 135 134* 134* 132*  K 3.9 4.2   < > 4.7 3.9 3.9 3.8  CL 106 105  --  104 105 103 102  CO2 22 24  --  23 21* 23 24  GLUCOSE 110* 112*  --  156* 108* 102* 124*  BUN 14 14  --  25* 29* 24* 14  CREATININE 0.98 0.94  --  1.68* 1.93* 1.73* 0.89  CALCIUM 8.0* 8.3*  --  8.5* 8.1* 8.1* 7.9*  MG 1.9 2.0  --  2.1  --   --   --   PROT  --   --   --  6.1* 5.8*  --   --   ALBUMIN  --   --   --  3.4* 3.3*  --   --   AST  --   --   --  21 19  --   --   ALT  --   --   --  14 8  --   --   ALKPHOS  --   --   --  39 35*  --   --   BILITOT  --   --   --  0.4 0.6  --   --   GFRNONAA >60 >60  --  42* 36* 41* >60  ANIONGAP 8 7  --  '8 8 8 6   '$ < > = values in this interval not displayed.    Lipids  Recent Labs  Lab 07/06/22 0249  CHOL 85  TRIG 59  HDL 18*  LDLCALC 55  CHOLHDL 4.7    Hematology Recent Labs  Lab 07/07/22 0307 07/07/22 1717 07/08/22 0453 07/09/22 0208  WBC 9.4  --  6.9 8.8  RBC 2.32*  --  2.57* 2.52*  HGB 7.0* 8.1* 7.9* 7.9*  HCT 23.6* 25.2* 23.9* 23.4*  MCV 101.7*  --  93.0 92.9  MCH 30.2  --  30.7 31.3  MCHC 29.7*  --  33.1 33.8  RDW 14.7  --  15.1 14.6  PLT 146*  --  145* 142*   Thyroid No results for input(s): "TSH", "FREET4" in the last 168 hours.  BNPNo results for input(s): "BNP", "PROBNP" in the last 168 hours.  DDimer No results for input(s): "DDIMER" in the last 168 hours.   Radiology    ECHO TEE  Result Date: 07/07/2022    TRANSESOPHOGEAL ECHO REPORT   Patient Name:   Steven Carter Date of Exam: 07/07/2022 Medical Rec #:  [id number]       Height:       73.0 in Accession #:    [id number]      Weight:       217.6 lb Date of Birth:  1946-03-06       BSA:           2.230 m Patient Age:    77 years        BP:           117/67 mmHg Patient Gender: M               HR:           89 bpm. Exam Location:  Inpatient Procedure: Transesophageal Echo and Color Doppler Indications:    Bacteremia  History:        Patient has prior history of Echocardiogram examinations, most                 recent 07/04/2022.  Sonographer:    Wilkie Aye RVT RCS Referring Phys: [id number] [place]: The transesophogeal probe was passed without difficulty through the esophogus of the patient. Sedation performed by different physician. The patient developed no complications during the procedure.  IMPRESSIONS  1. Left ventricular ejection fraction, by estimation, is 40%. The left ventricle has moderately decreased function. The left ventricle demonstrates global hypokinesis.  2. Right ventricular systolic function is normal. The right ventricular size is normal.  3. No left atrial/left atrial appendage thrombus was detected.  4. The mitral valve is normal in structure. Trivial mitral valve regurgitation. No evidence of mitral stenosis.  5. The aortic valve is tricuspid. Aortic valve regurgitation is mild. No aortic stenosis is present. Conclusion(s)/Recommendation(s): No evidence of vegetation/infective endocarditis on this transesophageael echocardiogram. FINDINGS  Left Ventricle: Left ventricular ejection fraction, by estimation, is 40%. The left ventricle has  moderately decreased function. The left ventricle demonstrates global hypokinesis. The left ventricular internal cavity size was normal in size. Right Ventricle: The right ventricular size is normal. No increase in right ventricular wall thickness. Right ventricular systolic function is normal. Left Atrium: Left atrial size was normal in size. No left atrial/left atrial appendage thrombus was detected. Right Atrium: Right atrial size was normal in size. Pericardium: Trivial pericardial effusion is present. Mitral Valve: The mitral valve  is normal in structure. Trivial mitral valve regurgitation. No evidence of mitral valve stenosis. There is no evidence of mitral valve vegetation. Tricuspid Valve: The tricuspid valve is normal in structure. Tricuspid valve regurgitation is trivial. No evidence of tricuspid stenosis. There is no evidence of tricuspid valve vegetation. Aortic Valve: The aortic valve is tricuspid. Aortic valve regurgitation is mild. No aortic stenosis is present. There is no evidence of aortic valve vegetation. Pulmonic Valve: The pulmonic valve was grossly normal. Pulmonic valve regurgitation is trivial. No evidence of pulmonic stenosis. Aorta: The aortic root is normal in size and structure. There is minimal (Grade I) plaque involving the descending aorta. IAS/Shunts: No atrial level shunt detected by color flow Doppler. Buford Dresser MD Electronically signed by Buford Dresser MD Signature Date/Time: 07/07/2022/4:14:01 PM    Final     Cardiac Studies    Patient Profile     77 y.o. 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   Assessment & Plan    1.Acute HFrEF - 06/2021 echo: LVEF 35%, global hypokinesis, normal RV - CXR pending, BNP pending. Euvolemic by exam, has not required diuresis - no plans for cath given presentation with mssa bacteremia, hematuria/anemia s/p transufion, plans for prostate biopsy  -medical therapy initially limited by low bp's which have resolved. Also limited by renal dysfunction Cr 1.93 now back to 0.9 -medical therapy with coreg 6.'25mg'$  bid. Add losartan '25mg'$  daily, if bp and renal function tolerate transition to entresto. Start aldactone 12.'5mg'$  daily. SGLT2i to follow next few days.     2.MSSA bacteremia - per primary team - TEE without evidence of vegetation    For questions or updates, please contact [place] Please consult [email or web address] for contact info under         Signed, Carlyle Dolly, MD  07/09/2022, 10:49 AM

## 2022-07-09 NOTE — Progress Notes (Signed)
  Progress Note   Patient: Steven Carter [id number] DOB: 1945-06-02 DOA: 07/02/2022     6 DOS: the patient was seen and examined on 07/09/2022        Brief hospital course: [name] is a 77 y.o. M with hypothyroidism and recently diagnosed urinary retention who presented with pelvic pain and inability to urinate.    After admission, incidentally found to have MSSA bacteremia, cardiomyopathy     Assessment and Plan: * MSSA bacteremia HH and OPAT to be ready tomorrow at earliest - Continue cefazolin - EOT 3/18, will need ID follow up     AKI (acute kidney injury) ([place]) Resolved with fluids. - Stop fluids    Acute blood loss anemia Due to hematuria Hgb now stable   Gross hematuria - Defer CBI, foley to Urology  Chronic systolic CHF (congestive heart failure) ([place]) - Consult Cardiology - New start of spironolactone, losartan today - Continue Coreg  Hypothyroidism - Continue levothyroxine  Benign prostatic hyperplasia - Continue flomax and finasteride          Subjective: No new complaints, no further hematuria. No fever, no confusion, appetite good.  Had interruption of catheter drainage last night, addressed by Urology, they have stopped his CBI again as of this morning, anticipate ready for discharge tonight or tomorrow morning.     Physical Exam: BP (!) 148/73 (BP Location: Left Arm)   Pulse 71   Temp 98 F (36.7 C) (Oral)   Resp 18   Ht 6\' 1"  (1.854 m)   Wt 98.7 kg   SpO2 99%   BMI 28.71 kg/m   Adult male, ambulating comfortably in the room, no acute distress, interactive No peripheral edema, respiratory rate normal Abdomen without distention Attention normal, affect appropriate, judgment insight appear normal, face symmetric, speech fluent, gait normal, oriented to person, place, and time Urinary catheter with clear yellow urine in the bag   Data Reviewed: Discussed with urology Basic metabolic panel shows creatinine back to  normal Complete blood count shows stable hemoglobin, no leukocytosis      Disposition: Status is: Inpatient Barriers to discharge include urology signed off, arrangement of home health antibiotics        Author: Edwin Dada, MD 07/09/2022 3:52 PM  For on call review [email or web address].

## 2022-07-09 NOTE — TOC Progression Note (Addendum)
Transition of Care [facility]) - Progression Note    Patient Details  Name: Steven Carter MRN: [id number] Date of Birth: 1945-05-17  [facility] CM/SW Contact  Henrietta Dine, RN Phone Number: 07/09/2022, 1:37 PM  Clinical Narrative:    Notified by Carolynn Sayers at [facility] @ 0930 pt's wife refused teach last PM and said "he will be here until Monday"; Pam also said she spoke w/ pt's wife by phone in efforts to arrange teaching;Spoke w/ Carolynn Sayers at [place]; she says meds have already been delivered to home; she also says pt will receive AM dose of medication in hospital; confirmed w/ Corene Cornea at [employer] will see pt tomorrow between 2pm and 4 pm, and;in-home education will be given; Dr Loleta Books notified.   Expected Discharge Plan: Home/Self Care Barriers to Discharge: Continued Medical Work up  Expected Discharge Plan and Services                                               Social Determinants of Health (SDOH) Interventions SDOH Screenings   Food Insecurity: No Food Insecurity (07/04/2022)  Housing: Low Risk  (07/04/2022)  Transportation Needs: No Transportation Needs (07/04/2022)  Utilities: Not At Risk (07/04/2022)  Tobacco Use: Low Risk  (07/07/2022)    Readmission Risk Interventions     No data to display

## 2022-07-09 NOTE — Progress Notes (Signed)
Assessed patient's urine at bedside. CBI clamped this AM on morning rounds. Urine clear yellow in tubing with CBI clamped. No clots in catheter bag. Patient doing well overall.  - OK to discharge home from GU standpoint.  - Will set up trial of void outpatient next week. Will need outpatient ureteroscopy and MRI fusion prostate biopsy.  Coralyn Pear, MD [facility] Urologic Surgery

## 2022-07-10 ENCOUNTER — Encounter: Payer: Self-pay | Admitting: Student

## 2022-07-10 DIAGNOSIS — I5021 Acute systolic (congestive) heart failure: Secondary | ICD-10-CM | POA: Diagnosis not present

## 2022-07-10 DIAGNOSIS — N401 Enlarged prostate with lower urinary tract symptoms: Secondary | ICD-10-CM | POA: Diagnosis not present

## 2022-07-10 DIAGNOSIS — R591 Generalized enlarged lymph nodes: Secondary | ICD-10-CM | POA: Diagnosis not present

## 2022-07-10 DIAGNOSIS — D62 Acute posthemorrhagic anemia: Secondary | ICD-10-CM | POA: Diagnosis not present

## 2022-07-10 DIAGNOSIS — R310 Gross hematuria: Secondary | ICD-10-CM | POA: Diagnosis not present

## 2022-07-10 DIAGNOSIS — Z466 Encounter for fitting and adjustment of urinary device: Secondary | ICD-10-CM | POA: Diagnosis not present

## 2022-07-10 DIAGNOSIS — R7881 Bacteremia: Secondary | ICD-10-CM | POA: Diagnosis not present

## 2022-07-10 DIAGNOSIS — Z792 Long term (current) use of antibiotics: Secondary | ICD-10-CM | POA: Diagnosis not present

## 2022-07-10 DIAGNOSIS — E039 Hypothyroidism, unspecified: Secondary | ICD-10-CM | POA: Diagnosis not present

## 2022-07-10 DIAGNOSIS — I5023 Acute on chronic systolic (congestive) heart failure: Secondary | ICD-10-CM | POA: Diagnosis not present

## 2022-07-10 DIAGNOSIS — I110 Hypertensive heart disease with heart failure: Secondary | ICD-10-CM | POA: Diagnosis not present

## 2022-07-10 DIAGNOSIS — R000 Tachycardia, unspecified: Secondary | ICD-10-CM | POA: Diagnosis not present

## 2022-07-10 DIAGNOSIS — B9561 Methicillin susceptible Staphylococcus aureus infection as the cause of diseases classified elsewhere: Secondary | ICD-10-CM | POA: Diagnosis not present

## 2022-07-10 DIAGNOSIS — N390 Urinary tract infection, site not specified: Secondary | ICD-10-CM | POA: Diagnosis not present

## 2022-07-10 DIAGNOSIS — R338 Other retention of urine: Secondary | ICD-10-CM | POA: Diagnosis not present

## 2022-07-10 DIAGNOSIS — Z556 Problems related to health literacy: Secondary | ICD-10-CM | POA: Diagnosis not present

## 2022-07-10 DIAGNOSIS — Z452 Encounter for adjustment and management of vascular access device: Secondary | ICD-10-CM | POA: Diagnosis not present

## 2022-07-10 LAB — CBC
HCT: 22.9 % — ABNORMAL LOW (ref 39.0–52.0)
Hemoglobin: 7.5 g/dL — ABNORMAL LOW (ref 13.0–17.0)
MCH: 31.1 pg (ref 26.0–34.0)
MCHC: 32.8 g/dL (ref 30.0–36.0)
MCV: 95 fL (ref 80.0–100.0)
Platelets: 162 10*3/uL (ref 150–400)
RBC: 2.41 MIL/uL — ABNORMAL LOW (ref 4.22–5.81)
RDW: 14.6 % (ref 11.5–15.5)
WBC: 9.6 10*3/uL (ref 4.0–10.5)
nRBC: 0 % (ref 0.0–0.2)

## 2022-07-10 LAB — BASIC METABOLIC PANEL
Anion gap: 5 (ref 5–15)
BUN: 15 mg/dL (ref 8–23)
CO2: 25 mmol/L (ref 22–32)
Calcium: 7.9 mg/dL — ABNORMAL LOW (ref 8.9–10.3)
Chloride: 105 mmol/L (ref 98–111)
Creatinine, Ser: 1.07 mg/dL (ref 0.61–1.24)
GFR, Estimated: >60 mL/min (ref >60)
Glucose, Bld: 108 mg/dL — ABNORMAL HIGH (ref 70–99)
Potassium: 3.4 mmol/L — ABNORMAL LOW (ref 3.5–5.1)
Sodium: 135 mmol/L (ref 135–145)

## 2022-07-10 LAB — BRAIN NATRIURETIC PEPTIDE: B Natriuretic Peptide: 49.5 pg/mL (ref 0.0–100.0)

## 2022-07-10 MED ORDER — LEVOFLOXACIN 750 MG PO TABS: 750.0000 mg | ORAL_TABLET | Freq: Every day | ORAL | Status: DC

## 2022-07-10 MED ORDER — LOSARTAN POTASSIUM 25 MG PO TABS: 25.0000 mg | ORAL_TABLET | Freq: Every day | ORAL | 3 refills | Status: DC

## 2022-07-10 MED ORDER — CARVEDILOL 6.25 MG PO TABS: 6.2500 mg | ORAL_TABLET | Freq: Two times a day (BID) | ORAL | 3 refills | Status: DC

## 2022-07-10 MED ORDER — EMPAGLIFLOZIN 10 MG PO TABS
10.0000 mg | ORAL_TABLET | Freq: Every day | ORAL | Status: DC
  Filled 2022-07-10: qty 1

## 2022-07-10 MED ORDER — SPIRONOLACTONE 25 MG PO TABS: 12.5000 mg | ORAL_TABLET | Freq: Every day | ORAL | 3 refills | Status: DC

## 2022-07-10 NOTE — Progress Notes (Signed)
Rounding Note    Patient Name: Steven Carter Date of Encounter: 07/10/2022  [place] Cardiologist: Berniece Salines, DO   Subjective   No complaints  Inpatient Medications    Scheduled Meds:  sodium chloride   Intravenous Once   carvedilol  6.25 mg Oral BID WC   Chlorhexidine Gluconate Cloth  6 each Topical QHS   docusate sodium  100 mg Oral BID   finasteride  5 mg Oral BID   levothyroxine  112 mcg Oral Daily   losartan  25 mg Oral Daily   oxybutynin  5 mg Oral Daily   polyethylene glycol  17 g Oral Daily   sodium chloride flush  10-40 mL Intracatheter Q12H   spironolactone  12.5 mg Oral Daily   tamsulosin  0.4 mg Oral BID   Continuous Infusions:  sodium chloride 10 mL/hr (07/09/22 0532)    ceFAZolin (ANCEF) IV 2 g (07/10/22 0905)   sodium chloride irrigation     sodium chloride irrigation     PRN Meds: sodium chloride, acetaminophen **OR** acetaminophen, hydrALAZINE, HYDROmorphone (DILAUDID) injection, ondansetron **OR** ondansetron (ZOFRAN) IV, mouth rinse, oxybutynin, sodium chloride flush   Vital Signs    Vitals:   07/09/22 1224 07/09/22 2048 07/10/22 0314 07/10/22 0909  BP: (!) 148/73 (!) 144/80 131/68 (!) 151/81  Pulse: 71 83 76 74  Resp: '18 20 18   '$ Temp: 98 F (36.7 C) 98.5 F (36.9 C) 98 F (36.7 C)   TempSrc: Oral Oral    SpO2: 99% 97% 96%   Weight:      Height:        Intake/Output Summary (Last 24 hours) at 07/10/2022 0917 Last data filed at 07/10/2022 0616 Gross per 24 hour  Intake 780 ml  Output 2595 ml  Net -1815 ml      07/05/2022    9:21 PM 07/03/2022    5:19 AM  Last 3 Weights  Weight (lbs) 217 lb 9.5 oz 219 lb 12.8 oz  Weight (kg) 98.7 kg 99.701 kg      Telemetry    NSR - Personally Reviewed  ECG    N/a - Personally Reviewed  Physical Exam   GEN: No acute distress.   Neck: No JVD Cardiac: RRR, no murmurs, rubs, or gallops.  Respiratory: Clear to auscultation bilaterally. GI: Soft, nontender, non-distended   MS: No edema; No deformity. Neuro:  Nonfocal  Psych: Normal affect   Labs    High Sensitivity Troponin:  No results for input(s): "TROPONINIHS" in the last 720 hours.   Chemistry Recent Labs  Lab 07/04/22 0448 07/05/22 0518 07/05/22 1829 07/06/22 0249 07/07/22 0307 07/08/22 0453 07/09/22 0208 07/10/22 0152  NA 136 136   < > 135 134* 134* 132* 135  K 3.9 4.2   < > 4.7 3.9 3.9 3.8 3.4*  CL 106 105  --  104 105 103 102 105  CO2 22 24  --  23 21* '23 24 25  '$ GLUCOSE 110* 112*  --  156* 108* 102* 124* 108*  BUN 14 14  --  25* 29* 24* 14 15  CREATININE 0.98 0.94  --  1.68* 1.93* 1.73* 0.89 1.07  CALCIUM 8.0* 8.3*  --  8.5* 8.1* 8.1* 7.9* 7.9*  MG 1.9 2.0  --  2.1  --   --   --   --   PROT  --   --   --  6.1* 5.8*  --   --   --  ALBUMIN  --   --   --  3.4* 3.3*  --   --   --   AST  --   --   --  21 19  --   --   --   ALT  --   --   --  14 8  --   --   --   ALKPHOS  --   --   --  39 35*  --   --   --   BILITOT  --   --   --  0.4 0.6  --   --   --   GFRNONAA >60 >60  --  42* 36* 41* >60 >60  ANIONGAP 8 7  --  '8 8 8 6 5   '$ < > = values in this interval not displayed.    Lipids  Recent Labs  Lab 07/06/22 0249  CHOL 85  TRIG 59  HDL 18*  LDLCALC 55  CHOLHDL 4.7    Hematology Recent Labs  Lab 07/08/22 0453 07/09/22 0208 07/10/22 0152  WBC 6.9 8.8 9.6  RBC 2.57* 2.52* 2.41*  HGB 7.9* 7.9* 7.5*  HCT 23.9* 23.4* 22.9*  MCV 93.0 92.9 95.0  MCH 30.7 31.3 31.1  MCHC 33.1 33.8 32.8  RDW 15.1 14.6 14.6  PLT 145* 142* 162   Thyroid No results for input(s): "TSH", "FREET4" in the last 168 hours.  BNP Recent Labs  Lab 07/10/22 0152  BNP 49.5    DDimer No results for input(s): "DDIMER" in the last 168 hours.   Radiology    DG CHEST PORT 1 VIEW  Result Date: 07/09/2022 CLINICAL DATA:  Congestive heart failure. EXAM: PORTABLE CHEST 1 VIEW COMPARISON:  None Available. FINDINGS: Clear lungs. Normal heart size and mediastinal contours. No pleural effusion or pneumothorax.  Visualized bones and upper abdomen are unremarkable. IMPRESSION: No evidence of acute cardiopulmonary disease. Electronically Signed   By: Emmit Alexanders M.D.   On: 07/09/2022 11:39    Cardiac Studies     Patient Profile     77 y.o. 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    Assessment & Plan    1.Acute HFrEF - 06/2021 echo: LVEF 35%, global hypokinesis, normal RV - CXR clear, BNP 50  . Euvolemic by exam, has not required diuresis - no plans for cath given presentation with mssa bacteremia, hematuria/anemia s/p transufion this admission, plans for prostate biopsy   -medical therapy initially limited by low bp's and AKI both of which have resolved -on  coreg 6.'25mg'$  bid, losartan 25, aldactone 12.'5mg'$  daily. Slight uptrend again in Cr, losartan started just yesterday,  monitor further before transitioning losartan to entresto. Will add jardiance '10mg'$  daily today.     2.MSSA bacteremia - per primary team - TEE without evidence of vegetation  3. Anemia/hematuria - per urology, primary team - transfused this admission 1 unit when Hgb was down to 7.   Can continue titrating HF meds as outpatient, no further inpatient cardiac needs at this time. We will arrange outpatient f/u and sign off inpatient care.    For questions or updates, please contact [place] Please consult [email or web address] for contact info under        Signed, Carlyle Dolly, MD  07/10/2022, 9:17 AM

## 2022-07-10 NOTE — Progress Notes (Addendum)
S/p Cystourethroscopy, clot evacuation with fulguration on 07/05/22.  Subjective: No acute events overnight. Urine this morning clear yellow with CBI clamped. Hgb 7.5  Objective: Vital signs in last 24 hours: Temp:  [98 F (36.7 C)-98.5 F (36.9 C)] 98 F (36.7 C) (03/10 0314) Pulse Rate:  [71-83] 74 (03/10 0909) Resp:  [18-20] 18 (03/10 0314) BP: (131-151)/(68-81) 151/81 (03/10 0909) SpO2:  [96 %-99 %] 96 % (03/10 0314)  Intake/Output from previous day: 03/09 0701 - 03/10 0700 In: 1030 [P.O.:720; I.V.:10; IV Piggyback:300] Out: 2595 [Urine:2595] Intake/Output this shift: No intake/output data recorded.  Physical Exam:  General: Alert and oriented CV: Regular rate Lungs: Normal work of breathing on room air Abdomen: Soft, ND, NT GU: Catheter in place to gravity drainage. Urine clear yellow with CBI clamped Ext: NT, No erythema  Lab Results: Recent Labs    07/08/22 0453 07/09/22 0208 07/10/22 0152  HGB 7.9* 7.9* 7.5*  HCT 23.9* 23.4* 22.9*    BMET Recent Labs    07/09/22 0208 07/10/22 0152  NA 132* 135  K 3.8 3.4*  CL 102 105  CO2 24 25  GLUCOSE 124* 108*  BUN 14 15  CREATININE 0.89 1.07  CALCIUM 7.9* 7.9*      Studies/Results: DG CHEST PORT 1 VIEW  Result Date: 07/09/2022 CLINICAL DATA:  Congestive heart failure. EXAM: PORTABLE CHEST 1 VIEW COMPARISON:  None Available. FINDINGS: Clear lungs. Normal heart size and mediastinal contours. No pleural effusion or pneumothorax. Visualized bones and upper abdomen are unremarkable. IMPRESSION: No evidence of acute cardiopulmonary disease. Electronically Signed   By: Emmit Alexanders M.D.   On: 07/09/2022 11:39    Assessment/Plan: 1.         Gross hematuria: S/p cysto/clot evac/fulguration on 07/05/2022. Identified trauma in prostatic urethra and right bladder neck, also blood effluxing from RUO. Renal u/s 3/5 with no upper tract abnormality. CTU with mild right hydro and debris in ureter. 2.         UTI: Ucx 07/02/2022  >100K staph aureus 3.         Elevated PSA/PIRADS 4 prostate lesion on MRI 06/2022. Was scheduled for MRI fusion biopsy this week however this is postponed due to admission with UTI. 4.         BPH/retention: Developed urinary retention in 06/2022 requiring foley placement outpatient  -Keep foley to gravity.  -Void trial outpatient this upcoming week. If fails, will replace smaller bore catheter. -Will require outpatient ureteroscopy and MRI fusion prostate biopsy.  -Continue abx    LOS: 7 days   S: S/p cysto clot evac for mostely prostate fossa bleeding. Ambulating, pain controlled.   O: In good spirtis, walkign around rooom Bleeding neearly resolved in urine and around catheter with light traction. Hgb sable.  A/P: Keep foley but off irrigation. Pt OK for DC from Urol perspective at anytime.

## 2022-07-10 NOTE — Progress Notes (Signed)
Patient was given discharge orders. RN went over discharge instructions/paperwork with patient and patient's spouse. The provider also went over the discharge instructions with the patient and the patient's spouse. Patient went home with Foley and midline single lumen since patient will be receiving home IV antibiotics. Patient will go home with home health to follow up with the IV antibiotics. The only concern the patient and patient's spouse had was about a medication that was listed on the discharge medication list. The provider was able to address the question/concern. RN showed the patient how to change Foley bag to leg back and vice versa prior to discharge. Patient had no questions or concerns. Patient left stable, had all personal belongings, and went home with spouse.

## 2022-07-10 NOTE — TOC Progression Note (Signed)
Transition of Care [facility]) - Progression Note    Patient Details  Name: Steven Carter MRN: [id number] Date of Birth: Nov 15, 1945  [facility] CM/SW Contact  Henrietta Dine, RN Phone Number: 07/10/2022, 9:52 AM  Clinical Narrative:    Notified by Carolynn Sayers from [place] she spoke w/ pt's wife and pt's wife viewed the training video on IV medication admin that was sent; Pam also said she attempted to connect with pt las PMf or hands on training; er their conversation pt's wife did not feel ready for education b/c she felt the pt was not ready for d/c;; Pam explained HHRN would come their home tomorrow, and education will be provided; the pt says no one told her that she was expected to administer the medication at home; this CM called pt's wife to discuss d/c plan; pt's wife said last night was the first time she learned she was expected to administer pt's IV antibiotics; she also says "this is piss poor discharge planning"; she says no one has talked w/ her about what to do if pt starts bleeding again; this TOC apologized that this was not clearly explained before, explained to wife pt's first dose of  today's medication, and the [facility] will come to the home today between 2-4 PM, and education will be provided at that time; reassured pt's wife that d/c instructions will be given; also encouraged her to discuss w/ Dr Loleta Books and pt's nurse; she verbalized understanding and agrees to d/c plan.   Expected Discharge Plan: Home/Self Care Barriers to Discharge: Continued Medical Work up  Expected Discharge Plan and Services         Expected Discharge Date: 07/10/22                                     Social Determinants of Health (SDOH) Interventions SDOH Screenings   Food Insecurity: No Food Insecurity (07/04/2022)  Housing: Low Risk  (07/04/2022)  Transportation Needs: No Transportation Needs (07/04/2022)  Utilities: Not At Risk (07/04/2022)  Tobacco Use: Low Risk   (07/07/2022)    Readmission Risk Interventions     No data to display

## 2022-07-10 NOTE — Progress Notes (Signed)
Cardiology Office Note:    Date:  07/21/2022   ID:  ROBERTA FARLEIGH, DOB [birth year], MRN [id number]  PCP:  Velna Hatchet, MD  Cardiologist:  Berniece Salines, DO  Electrophysiologist:  None   Referring MD: [facility]   Chief Complaint: hospital follow-up of CHF  History of Present Illness:    Steven Carter is a 77 y.o. 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 Harriet Masson and presents today for hospital follow-up of CHF.  Patient was first seen by Cardiology during recent admission. He was admitted from 07/02/2022 to 07/10/2022 for bacteremia secondary to UTI after presenting with acute pelvic pain and clogged Foley which he had placed by Urology at the end of 06/2022 for urinary retention. TTE showed LVEF of 35% with global hypokinesis. TEE showed LVEF of 40% with no evidence of endocarditis. Given reduced EF, Cardiology was consulted. GDMT was initially limited by low BP and AKI which both resolved and she was able to be discharged on Losartan, Coreg, and Spironolactone. Hospitalization was complicated anemia and hematuria and he required one unit of PRBCs. Therefore, no cardiac catheterization was pursued.   Patient presents today for follow-up. Here with his wife Mardene Celeste who is a PA. Patient is doing well from a cardiac. He describes being very deconditioned and weak due to the anemia but hemoglobin is improving and so is his stamina. He says he is feeling stronger every day and feels much better today than he did yesterday. He denies any chest pain. He describes some mild shortness of breath with exertion but this sounds like it is more due to deconditioning. No shortness of breath at rest. No orthopnea, PND, or edema. No palpitations, lightheadedness, dizziness, or syncope. He denies any recurrent hematuria. He still has a Foley in and will be on IV antibiotics for 2 more  weeks. He is going to need a prostate biopsy and a colonoscopy in the near future.  Past Medical History:  Diagnosis Date   BPH (benign prostatic hyperplasia)    Chronic HFrEF (heart failure with reduced ejection fraction) ([place])    Hypothyroidism    Thyroid disease     Past Surgical History:  Procedure Laterality Date   CYSTOSCOPY WITH FULGERATION N/A 07/05/2022   Procedure: CYSTOSCOPY WITH FULGERATION, CLOT EVACUATION;  Surgeon: Pamala Hurry, MD;  Location: [facility];  Service: Urology;  Laterality: N/A;   TEE WITHOUT CARDIOVERSION N/A 07/07/2022   Procedure: TRANSESOPHAGEAL ECHOCARDIOGRAM (TEE);  Surgeon: Buford Dresser, MD;  Location: [facility] ENDOSCOPY;  Service: Cardiovascular;  Laterality: N/A;    Current Medications: Current Meds  Medication Sig   carvedilol (COREG) 6.25 MG tablet Take 1 tablet (6.25 mg total) by mouth 2 (two) times daily with a meal.   finasteride (PROSCAR) 5 MG tablet Take 5 mg by mouth daily.   levothyroxine (SYNTHROID) 112 MCG tablet Take 150 mcg by mouth daily.   linezolid (ZYVOX) 600 MG tablet Take 1 tablet (600 mg total) by mouth 2 (two) times daily for 14 days. Start on 07/19/22 after completion of IV antibiotics   losartan (COZAAR) 25 MG tablet Take 1 tablet (25 mg total) by mouth daily.   metoprolol tartrate (LOPRESSOR) 100 MG tablet Take tablet by mouth 2 hours before CTA   oxybutynin (DITROPAN-XL) 5 MG 24 hr tablet Take 5 mg by mouth daily.   spironolactone (ALDACTONE) 25 MG  tablet Take 0.5 tablets (12.5 mg total) by mouth daily.   tamsulosin (FLOMAX) 0.4 MG CAPS capsule Take 0.8 mg by mouth daily.     Allergies:   Patient has no known allergies.   Social History   Socioeconomic History   Marital status: Married    Spouse name: Not on file   Number of children: Not on file   Years of education: Not on file   Highest education level: Not on file  Occupational History   Not on file  Tobacco Use   Smoking status: Never    Passive exposure:  Never   Smokeless tobacco: Never  Substance and Sexual Activity   Alcohol use: Yes    Alcohol/week: 7.0 standard drinks of alcohol    Types: 7 Glasses of wine per week   Drug use: Never   Sexual activity: Not Currently  Other Topics Concern   Not on file  Social History Narrative   Not on file   Social Determinants of Health   Financial Resource Strain: Not on file  Food Insecurity: No Food Insecurity (07/04/2022)   Hunger Vital Sign    Worried About Running Out of Food in the Last Year: Never true    Ran Out of Food in the Last Year: Never true  Transportation Needs: No Transportation Needs (07/04/2022)   PRAPARE - Hydrologist (Medical): No    Lack of Transportation (Non-Medical): No  Physical Activity: Not on file  Stress: Not on file  Social Connections: Not on file     Family History: The patient's Family history is unknown by patient.  ROS:   Please see the history of present illness.     EKGs/Labs/Other Studies Reviewed:    The following studies were reviewed:  TTE 07/04/2022: Impressions: 1. No obvious vegetation though cannot completely exclude REcomm TEE if  clinically indicated.   2. Left ventricular ejection fraction, by estimation, is 35%. The left  ventricle demonstrates global hypokinesis. There is mild concentric left  ventricular hypertrophy.   3. Right ventricular systolic function is normal. The right ventricular  size is normal.   4. Trivial mitral valve regurgitation.   5. The aortic valve is tricuspid. Aortic valve regurgitation is trivial.  _______________  TEE 07/07/2022: Impressions: 1. Left ventricular ejection fraction, by estimation, is 40%. The left  ventricle has moderately decreased function. The left ventricle  demonstrates global hypokinesis.   2. Right ventricular systolic function is normal. The right ventricular  size is normal.   3. No left atrial/left atrial appendage thrombus was detected.   4. The  mitral valve is normal in structure. Trivial mitral valve  regurgitation. No evidence of mitral stenosis.   5. The aortic valve is tricuspid. Aortic valve regurgitation is mild. No  aortic stenosis is present.   Conclusion(s)/Recommendation(s): No evidence of vegetation/infective  endocarditis on this transesophageael echocardiogram.    EKG:  EKG not ordered today.   Recent Labs: 07/06/2022: Magnesium 2.1 07/07/2022: ALT 8 07/10/2022: B Natriuretic Peptide 49.5 07/19/2022: BUN 13; Creat 0.96; Hemoglobin 9.3; Platelets 198; Potassium 4.8; Sodium 138  Recent Lipid Panel    Component Value Date/Time   CHOL 85 07/06/2022 0249   TRIG 59 07/06/2022 0249   HDL 18 (L) 07/06/2022 0249   CHOLHDL 4.7 07/06/2022 0249   VLDL 12 07/06/2022 0249   LDLCALC 55 07/06/2022 0249    Physical Exam:    Vital Signs: BP 120/62 (BP Location: Left Arm, Patient Position: Sitting, Cuff  Size: Large)   Pulse 88   Ht 6\' 1"  (1.854 m)   Wt 219 lb 6.4 oz (99.5 kg)   SpO2 97%   BMI 28.95 kg/m     Wt Readings from Last 3 Encounters:  07/21/22 219 lb 6.4 oz (99.5 kg)  07/20/22 221 lb (100.2 kg)  07/19/22 221 lb (100.2 kg)     General: 77 y.o. Caucasian male in no acute distress. HEENT: Normocephalic and atraumatic. Sclera clear.  Neck: Supple. No carotid bruits. No JVD. Heart: RRR. Distinct S1 and S2. No murmurs, gallops, or rubs. Radial pulses 2+ and equal bilaterally. Lungs: No increased work of breathing. Clear to ausculation bilaterally. No wheezes, rhonchi, or rales.  Abdomen: Soft, non-distended, and non-tender to palpation.  Extremities: No lower extremity edema.    Skin: Warm and dry. Neuro: Alert and oriented x3. No focal deficits. Psych: Normal affect. Responds appropriately.  Assessment:    1. Chronic HFrEF (heart failure with reduced ejection fraction) ([place])   2. Anemia, unspecified type   3. Gross hematuria   4. Preprocedural cardiovascular examination     Plan:    Chronic  HFrEF Recently admitted for bacteremia secondary to UTI. Work-up revealed reduced EF. Echo showed LVEF of 35% with global hypokinesis. She was started on GDMT but cardiac catheterization was not pursed due to anemia and hematuria requiring a blood transfusion.  - Euvolemic on exam.  - GDMT was limited in the hospital due to hypotension. He is tolerating current medications well so will continue.  - Continue Losartan 25mg  daily.  - Continue Coreg 6.25mg  twice daily.  - Continue Spironolactone 12.5mg  daily. - Will hold off on SGLT2 inhibitor given recent bacteremia secondary to UTI (still on IV antibiotics). - Discussed importance of daily weights and sodium/fluid restrictions.  - Will order a coronary CTA to rule out ischemia as cause of CHF. Will prescribed one time dose of Lopressor 100mg  for him to take 1.5-2 hours prior to CTA. - Will repeat Echo in 3 months to reassess LV function.  AKI - Resolved Patient had some AKI during recent admission but renal function ultimately returned to baseline prior to discharge. Baseline creatinine around 0.9 to 1.2. - Creatinine 0.96 on labs earlier this week on 07/19/2022.  Hypokalemia - Resolved Potassium slightly low at 3.4 on recent discharge. - Potassium improved to 4.8 on repeat labs earlier this week on 07/19/2022.  Anemia  Hematuria Hemoglobin dropped as low as 7.0 during recent admission in setting of hematuria. S/p 1 unit of PRBCs. Hemoglobin 7.5 on day of discharge.  - Hematuria has resolved. - Hemoglobin improved to 9.3 on repeat labs earlier this week on 07/19/2022. - Followed by Urology.   Pre-Op Evaluation Patient is going to need prostate biopsy and colonoscopy in the near future. He is doing well from a cardiac standpoint with no angina, acute CHF symptoms, palpitations, syncope. Will ordered coronary CTA to rule out ischemia as the cause of his CHF. As long as no high grade stenosis, okay to proceed with low risk procedures.    Disposition: Follow up in 4-5 months.   Medication Adjustments/Labs and Tests Ordered: Current medicines are reviewed at length with the patient today.  Concerns regarding medicines are outlined above.  Orders Placed This Encounter  Procedures   CT CORONARY MORPH W/CTA COR W/SCORE W/CA W/CM &/OR WO/CM   ECHOCARDIOGRAM COMPLETE   Meds ordered this encounter  Medications   metoprolol tartrate (LOPRESSOR) 100 MG tablet    Sig: Take tablet  by mouth 2 hours before CTA    Dispense:  1 tablet    Refill:  0    Patient Instructions  Medication Instructions:   TAKE Metoprolol Tartrate 100 mg 2 hours before Coronary CT  *If you need a refill on your cardiac medications before your next appointment, please call your pharmacy*  Lab Work: NONE ordered at this time of appointment   If you have labs (blood work) drawn today and your tests are completely normal, you will receive your results only by: [place] (if you have MyChart) OR A paper copy in the mail If you have any lab test that is abnormal or we need to change your treatment, we will call you to review the results.  Testing/Procedures: Sande Rives, PA-C has requested that you have an echocardiogram. Echocardiography is a painless test that uses sound waves to create images of your heart. It provides your doctor with information about the size and shape of your heart and how well your heart's chambers and valves are working. This procedure takes approximately one hour. There are no restrictions for this procedure. Please do NOT wear cologne, perfume, aftershave, or lotions (deodorant is allowed). Please arrive 15 minutes prior to your appointment time.   oodrich, PA-C has requested that you have cardiac CT. Cardiac CT Angiography (CTA), is a special type of CT scan that uses a computer to produce multi-dimensional views of major blood vessels throughout the body. In CT angiography, a contrast material is injected  through an IV to help visualize the blood vessels. A cardiac CT angiogram is a procedure to look at the heart and the area around the heart. It may be done to help find the cause of chest pains or other symptoms of heart disease. During this procedure, a substance called contrast dye is injected into a vein in the arm. The contrast highlights the blood vessels in the area to be checked. A large X-ray machine (CT scanner), then takes detailed pictures of the heart and the surrounding area. The procedure is also sometimes called a coronary CT angiogram, coronary artery scanning, or CTA.  Follow-Up: At [facility], you and your health needs are our priority.  As part of our continuing mission to provide you with exceptional heart care, we have created designated Provider Care Teams.  These Care Teams include your primary Cardiologist (physician) and Advanced Practice Providers (APPs -  Physician Assistants and Nurse Practitioners) who all work together to provide you with the care you need, when you need it.  Your next appointment:   3-4 month(s)  Provider:   Berniece Salines, DO  or Sande Rives, PA-C        Other Instructions    Your cardiac CT will be scheduled at one of the below locations:   [facility] [address] [place], [place] [address] [phone]  [facility] [address] [place], [place] [address] [phone]  [facility] [place], [place] [address] [phone]  If scheduled at [facility], please arrive at the [facility] and Children's Entrance (Entrance C2) of [facility] 30 minutes prior to test start time. You can use the FREE valet parking offered at entrance C (encouraged to control the heart rate for the test)  Proceed to the [facility] Radiology Department (first floor) to check-in and test prep.  All radiology patients and  guests should use  entrance C2 at [facility], accessed from [facility], even though the hospital's physical address listed is [address].    If scheduled at [facility] or [facility], please arrive 15 mins early for check-in and test prep.   Please follow these instructions carefully (unless otherwise directed):  Hold all erectile dysfunction medications at least 3 days (72 hrs) prior to test. (Ie viagra, cialis, sildenafil, tadalafil, etc) We will administer nitroglycerin during this exam.   On the Night Before the Test: Be sure to Drink plenty of water. Do not consume any caffeinated/decaffeinated beverages or chocolate 12 hours prior to your test. Do not take any antihistamines 12 hours prior to your test. If the patient has contrast allergy: Patient will need a prescription for Prednisone and very clear instructions (as follows): Prednisone 50 mg - take 13 hours prior to test Take another Prednisone 50 mg 7 hours prior to test Take another Prednisone 50 mg 1 hour prior to test Take Benadryl 50 mg 1 hour prior to test Patient must complete all four doses of above prophylactic medications. Patient will need a ride after test due to Benadryl.  On the Day of the Test: Drink plenty of water until 1 hour prior to the test. Do not eat any food 1 hour prior to test. You may take your regular medications prior to the test.  Take metoprolol (Lopressor) two hours prior to test. If you take Furosemide/Hydrochlorothiazide/Spironolactone, please HOLD on the morning of the test. FEMALES- please wear underwire-free bra if available, avoid dresses & tight clothing  After the Test: Drink plenty of water. After receiving IV contrast, you may experience a mild flushed feeling. This is normal. On occasion, you may experience a mild rash up to 24 hours after the test. This is not dangerous. If this occurs, you can take  Benadryl 25 mg and increase your fluid intake. If you experience trouble breathing, this can be serious. If it is severe call 911 IMMEDIATELY. If it is mild, please call our office. If you take any of these medications: Glipizide/Metformin, Avandament, Glucavance, please do not take 48 hours after completing test unless otherwise instructed.  We will call to schedule your test 2-4 weeks out understanding that some insurance companies will need an authorization prior to the service being performed.   For non-scheduling related questions, please contact the cardiac imaging nurse navigator should you have any questions/concerns: Marchia Bond, Cardiac Imaging Nurse Navigator Gordy Clement, Cardiac Imaging Nurse Navigator [facility] Direct Office Dial: [phone]   For scheduling needs, including cancellations and rescheduling, please call [place], [phone].     Signed, Darreld Mclean, PA-C  07/21/2022 8:17 PM    [place]

## 2022-07-10 NOTE — Plan of Care (Signed)

## 2022-07-10 NOTE — TOC Transition Note (Addendum)
Transition of Care [facility]) - CM/SW Discharge Note   Patient Details  Name: Steven Carter MRN: [id number] Date of Birth: Sep 19, 1945  Transition of Care [facility]) CM/SW Contact:  Henrietta Dine, RN Phone Number: 07/10/2022, 10:14 AM   Clinical Narrative:       Final next level of care: [place] Barriers to Discharge: No Barriers Identified   Patient Goals and CMS Choice  D/C orders received; discussed d/c plans w/ pt's wife [name]; pt will receive first dose of antibiotics in hospital, and [facility] will come to home today between 2-4 to educate her on administering education and  giving the second dose; she agrees d/c plan; contact information for agencies placed in follow up provider section of d/c instructions; Pam from [employer] and Corene Cornea at [employer] notified; no TOC needs.    Discharge Placement                    Name of family member notified: [name] (spouse) [phone] Patient and family notified of of transfer: 07/10/22  Discharge Plan and Services Additional resources added to the After Visit Summary for                                       Social Determinants of Health (SDOH) Interventions SDOH Screenings   Food Insecurity: No Food Insecurity (07/04/2022)  Housing: Low Risk  (07/04/2022)  Transportation Needs: No Transportation Needs (07/04/2022)  Utilities: Not At Risk (07/04/2022)  Tobacco Use: Low Risk  (07/07/2022)     Readmission Risk Interventions     No data to display

## 2022-07-10 NOTE — Discharge Summary (Signed)
Physician Discharge Summary   Patient: Steven Carter MRN: [id number] DOB: 09/22/1945  Admit date:     07/02/2022  Discharge date: 07/10/22  Discharge Physician: Edwin Dada   PCP: [place]     Recommendations at discharge:  Follow up with ID Dr. West Bali for MSSA bacteremia Follow up with Urology Dr. 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 is a 77 y.o. M with hypothyroidism and recently diagnosed urinary retention who presented with pelvic pain and family of drainage of foley.    Had prior LUTS/BPH symptoms, developed urinary retention in mid-Feb requiring foley; in that context, had MRI prostate due to elevated PSA and found to have mesenteric adenopathy, plan was for colonoscopy and also prostate biopsy.  While this work up was pending, had above acute pain and failure of drainage of his foley, so came to the ER.     After admission, incidentally found to have MSSA bacteremia        * MSSA bacteremia Admitted on antibiotics, ID consulted.    TTE and TEE showed no vegetations.  Had no clinical other satellites of infection other than urinary tract.    Repeat cultures negative, ID recommended 2 weeks IV cefazolin via midilne followed by Linezolid.  Has ID office appt pending.    Needs weekly CBC/d and BMP      Acute blood loss anemia Due to hematuria.  Required 1u PRBCs, post transfusion,  had no clinically significant bleeding, Hgb remained stable. - Repeat CBC weekly while on cephalosporin   Gross hematuria Urology noted trauma in prostatic urethra and right bladder neck, also possible blood effluxing from RUO on cystoscopy.    Urology plan for ureteroscopy at appropriate interval.     Mesenteric lymphadenopathy Colonoscopy is pending.  Chronic systolic CHF (congestive heart failure) (HCC) Incidentally noted to have low EF on TTE for bacteremia.  New diagnosis.  No symptoms of acute CHF.  INitially had hypotension with Entresto.  Afterwards, BP stabilized, was discharge on losartan, carvedilol and spironolactone.  Jardiance held given staph UTI/bacteremia.    Has pending Cardiology appointment            The [facility] Controlled Substances Registry was reviewed for this patient prior to discharge.  Consultants:  Urology Infectious disease Cardiology  Procedures performed:  S/p Cystourethroscopy, Clot Evacuation with Fulguration on 3/5 by Dr. [name]   Disposition: Home health Diet recommendation:  Regular diet  DISCHARGE MEDICATION: Allergies as of 07/10/2022   No Known Allergies      Medication List     STOP taking these medications    gentamicin cream 0.1 % Commonly known as: GARAMYCIN   meloxicam 15 MG tablet Commonly known as: MOBIC       TAKE these medications    carvedilol 6.25 MG tablet Commonly known as: COREG Take 1 tablet (6.25 mg total) by mouth 2 (two) times daily with a meal.   ceFAZolin  IVPB Commonly  known as: ANCEF Inject 2 g into the vein every 8 (eight) hours for 11 days. Indication:  MSSA bacteremia First Dose: Yes Last Day of Therapy:  07/18/22 Labs - Once weekly:  CBC/D and BMP, Labs - Every other week:  ESR and CRP Method of administration: IV Push Pull PICC/midline at the completion of IV therapy Method of administration may be changed at the discretion of home infusion pharmacist based upon assessment  of the patient and/or caregiver's ability to self-administer the medication ordered.   finasteride 5 MG tablet Commonly known as: PROSCAR Take 5 mg by mouth daily.   levofloxacin 750 MG tablet Commonly known as: LEVAQUIN Take 1 tablet (750 mg total) by mouth daily. Ask your doctor about how to take this What changed: additional instructions   levothyroxine 112 MCG tablet Commonly known as: SYNTHROID Take 112 mcg by mouth daily.   linezolid 600 MG tablet Commonly known as: ZYVOX Take 1 tablet (600 mg total) by mouth 2 (two) times daily for 14 days. Start on 07/19/22 after completion of IV antibiotics Start taking on: July 19, 2022   losartan 25 MG tablet Commonly known as: COZAAR Take 1 tablet (25 mg total) by mouth daily. Start taking on: July 11, 2022   oxybutynin 5 MG 24 hr tablet Commonly known as: DITROPAN-XL Take 5 mg by mouth daily.   spironolactone 25 MG tablet Commonly known as: ALDACTONE Take 0.5 tablets (12.5 mg total) by mouth daily. Start taking on: July 11, 2022   tamsulosin 0.4 MG Caps capsule Commonly known as: FLOMAX Take 0.8 mg by mouth daily.               Discharge Care Instructions  (From admission, onward)           Start     Ordered   07/08/22 0000  Change dressing on IV access line weekly and PRN  (Home infusion instructions - [facility] )        07/08/22 1358            Follow-up Information     [facility]. Call on 07/11/2022.   Contact information: [place] [address] [place]        Loralie Champagne, PA-C Follow up on 08/16/2022.   Specialty: Gastroenterology Why: 10 AM Contact information: [place] [address] [phone]         [facility] [employer] Follow up.   Why: Follow up Contact information: [place] [address] [phone]         [facility]  Follow up.   Contact information: [place] [phone]        Berniece Salines, DO Follow up.   Specialty: Cardiology Why: See Dr. Terrial Rhodes PA Sande Rives on 3/21 Contact information: [address] [place] [address] [phone]                 Discharge Instructions     [facility] pharmacist to adjust dose for Vancomycin, Aminoglycosides and other anti-infective therapies as requested by physician.   Complete by: As directed    [facility] to provide Cath Flo '2mg'$    Complete by: As directed    Administer for PICC line occlusion and as ordered by physician for other access device issues.   Anaphylaxis Kit: Provided to treat any anaphylactic reaction to the medication being provided to the  patient if First Dose or when requested by physician   Complete by: As directed    Epinephrine '1mg'$ /ml vial / amp: Administer 0.'3mg'$  (0.42m) subcutaneously once for moderate to severe anaphylaxis, nurse to call physician and pharmacy when reaction occurs and call 911 if needed for immediate care   Diphenhydramine '50mg'$ /ml IV vial: Administer 25-'50mg'$  IV/IM PRN for first dose reaction, rash, itching, mild reaction, nurse to call physician and pharmacy when reaction occurs   Sodium Chloride 0.9% NS 5095mIV: Administer if needed for hypovolemic blood pressure drop or as ordered by physician after call to physician with anaphylactic reaction   Change dressing on IV access line weekly and PRN   Complete by: As directed    Discharge instructions   Complete by: As directed    **IMPORTANT DISCHARGE INSTRUCTIONS**   From Dr. DaLoleta BooksYou were admitted for trouble urinating Here, we found that you had Staph bacteria in your urine and a staph blood stream infection The type of staph was MSSA or "methicillin sensitive staphylococcus aureus"  This is treated with IV antibiotics. In your case, you should take intravenous cefazolin 2g every 8 hours until Mar.  18 AFTER you finish cefazolin, the IV should come out and you should take the second antibiotic linezolid as directed by infectious disease starting Mar 19  You have an appointment with the ID clinic on March 19 with Dr. MaWest Balisee below in the To Do section)   Next, you were diagnosed with bleeding from the prostate and possibly the right ureter I speculate the prostate bleeding was accidental injury from a foley catheter This should be resolving  The ureter may or may not be bleeding.  Your urologist will help you plan for ureteroscopy to figure out if it is    Next, you were noted to have weak heart squeeze (this can be called alternately cardiomyopathy or systolic congestive heart failure) You should take the following heart protective medicines: Losartan Carvedilol/Coreg Spironolactone  Go see the heart clinic (Dr. ToTerrial RhodesA CaSande Rivesn 3/21)   Lastly, you have an appointment coming up with [facility] to discuss colonoscopy, see below   Flush IV access with Sodium Chloride 0.9% and Heparin 10 units/ml or 100 units/ml   Complete by: As directed    Home infusion instructions - [facility]   Complete by: As directed    Instructions: Flush IV access with Sodium Chloride 0.9% and Heparin 10units/ml or 100units/ml   Change dressing on IV access line: Weekly and PRN   Instructions Cath Flo '2mg'$ : Administer for PICC Line occlusion and as ordered by physician for other access device   [facility] pharmacist to adjust dose for: Vancomycin, Aminoglycosides and other anti-infective therapies as requested by physician   Increase activity slowly   Complete by: As directed    Method of administration may be changed at the discretion of home infusion pharmacist based upon assessment of the patient and/or caregiver's ability to self-administer the medication ordered   Complete by: As directed    No wound care   Complete by: As directed        Discharge  Exam: Filed Weights   07/03/22 0519 07/05/22 2121  Weight: 99.7 kg 98.7 kg    General: Pt is alert, awake, not in acute distress Cardiovascular: RRR, nl S1-S2, no murmurs appreciated.   No LE edema.   Respiratory: Normal respiratory rate and rhythm.  CTAB without rales or wheezes. Abdominal: Abdomen soft and non-tender.  No distension or HSM.   Neuro/Psych: Strength symmetric in upper and lower extremities.  Judgment and insight appear normal.   Condition at discharge: good  The results of significant diagnostics from this hospitalization (including imaging, microbiology, ancillary and laboratory) are listed below for reference.   Imaging Studies: DG CHEST PORT 1 VIEW  Result Date: 07/09/2022 CLINICAL DATA:  Congestive heart failure. EXAM: PORTABLE CHEST 1 VIEW COMPARISON:  None Available. FINDINGS: Clear lungs. Normal heart size and mediastinal contours. No pleural effusion or pneumothorax. Visualized bones and upper abdomen are unremarkable. IMPRESSION: No evidence of acute cardiopulmonary disease. Electronically Signed   By: Emmit Alexanders M.D.   On: 07/09/2022 11:39   ECHO TEE  Result Date: 07/07/2022    TRANSESOPHOGEAL ECHO REPORT   Patient Name:   LYELL TOTINO Date of Exam: 07/07/2022 Medical Rec #:  [id number]       Height:       73.0 in Accession #:    [id number]      Weight:       217.6 lb Date of Birth:  12/21/1945       BSA:          2.230 m Patient Age:    31 years        BP:           117/67 mmHg Patient Gender: M               HR:           89 bpm. Exam Location:  Inpatient Procedure: Transesophageal Echo and Color Doppler Indications:    Bacteremia  History:        Patient has prior history of Echocardiogram examinations, most                 recent 07/04/2022.  Sonographer:    Wilkie Aye RVT RCS Referring Phys: [id number] Experiment: The transesophogeal probe was passed without difficulty through the esophogus of the patient. Sedation performed by different  physician. The patient developed no complications during the procedure.  IMPRESSIONS  1. Left ventricular ejection fraction, by estimation, is 40%. The left ventricle has moderately decreased function. The left ventricle demonstrates global hypokinesis.  2. Right ventricular systolic function is normal. The right ventricular size is normal.  3. No left atrial/left atrial appendage thrombus was detected.  4. The mitral valve is normal in structure. Trivial mitral valve regurgitation. No evidence of mitral stenosis.  5. The aortic valve is tricuspid. Aortic valve regurgitation is mild. No aortic stenosis is present. Conclusion(s)/Recommendation(s): No evidence of vegetation/infective endocarditis on this transesophageael echocardiogram. FINDINGS  Left Ventricle: Left ventricular ejection fraction, by estimation, is 40%. The left ventricle has moderately decreased function. The left ventricle demonstrates global hypokinesis. The left ventricular internal cavity size was normal in size. Right Ventricle: The right ventricular size is normal. No increase in right ventricular wall thickness. Right ventricular systolic function is normal. Left Atrium: Left atrial size was normal in size. No left atrial/left atrial appendage thrombus was detected. Right Atrium: Right atrial size was normal in size. Pericardium: Trivial pericardial effusion is present. Mitral Valve: The mitral valve is normal in structure. Trivial mitral valve regurgitation. No evidence of mitral valve stenosis. There is no evidence of mitral valve vegetation. Tricuspid Valve: The tricuspid valve is normal in structure. Tricuspid valve regurgitation is trivial. No evidence of tricuspid stenosis. There is no evidence of tricuspid valve vegetation. Aortic Valve: The aortic valve is tricuspid. Aortic  valve regurgitation is mild. No aortic stenosis is present. There is no evidence of aortic valve vegetation. Pulmonic Valve: The pulmonic valve was grossly normal.  Pulmonic valve regurgitation is trivial. No evidence of pulmonic stenosis. Aorta: The aortic root is normal in size and structure. There is minimal (Grade I) plaque involving the descending aorta. IAS/Shunts: No atrial level shunt detected by color flow Doppler. Buford Dresser MD Electronically signed by Buford Dresser MD Signature Date/Time: 07/07/2022/4:14:01 PM    Final    CT HEMATURIA WORKUP  Result Date: 07/06/2022 CLINICAL DATA:  Gross hematuria. Status cystoscopy and clot evacuation. Fulguration. EXAM: CT ABDOMEN AND PELVIS WITHOUT AND WITH CONTRAST TECHNIQUE: Multidetector CT imaging of the abdomen and pelvis was performed following the standard protocol before and following the bolus administration of intravenous contrast. RADIATION DOSE REDUCTION: This exam was performed according to the departmental dose-optimization program which includes automated exposure control, adjustment of the mA and/or kV according to patient size and/or use of iterative reconstruction technique. CONTRAST:  5m OMNIPAQUE IOHEXOL 300 MG/ML  SOLN COMPARISON:  Prostate MRI 06/24/2022 FINDINGS: Lower chest: Lung bases are clear. Hepatobiliary: No focal hepatic lesion. Normal gallbladder. No biliary duct dilatation. Common bile duct is normal. Pancreas: Pancreas is normal. No ductal dilatation. No pancreatic inflammation. Spleen: Normal spleen Adrenals/urinary tract: Adrenal glands normal. No enhancing renal cortical lesion. Mild pelvicaliectasis and hydroureter on the RIGHT. No obstructing lesion identified. Bladder is collapsed from Foley catheter. Enlarged prostate gland again noted. Delayed imaging demonstrates contrast through the entirety of the LEFT ureter entering the bladder. No obstruction. There is delayed excretion and no significant contrast within the mildly dilated RIGHT ureter. Stomach/Bowel: The stomach, duodenum, and small bowel normal. The colon and rectosigmoid colon are normal. Vascular/Lymphatic:  Abdominal aorta normal caliber. Multiple small lymph nodes in the operator space is. Small lymph node in the LEFT perirectal fat measuring 5 mm on image 83/4) Reproductive: Prostatomegaly.  Foley catheter in place. Other: No free fluid. Musculoskeletal: No aggressive osseous lesion. IMPRESSION: 1. Mild pelvicaliectasis and hydroureter on the RIGHT. No obstructing lesion identified. Potential debris within the RIGHT ureter. RIGHT ureter not opacified on postcontrast delayed imaging. 2. Bladder collapsed from Foley catheter. 3. Prostatomegaly. 4. Small lymph nodes in the operator space and LEFT perirectal fat. Electronically Signed   By: SSuzy BouchardM.D.   On: 07/06/2022 12:50   [place]  Result Date: 07/05/2022 CLINICAL DATA:  Gross hematuria EXAM: RENAL / URINARY TRACT ULTRASOUND COMPLETE COMPARISON:  CT abdomen 09/09/2010 FINDINGS: Right Kidney: Renal measurements: 11.2 by 6.2 by 4.8 cm = volume: 176 mL. Echogenicity within normal limits. No mass or hydronephrosis visualized. Left Kidney: Renal measurements: 11.5 by 7.0 by 4.7 cm = volume: 199 mL. Echogenicity within normal limits. No mass or hydronephrosis visualized. Bladder: Foley catheter noted in collapsed urinary bladder, heterogeneous masslike appearance around the catheter could be from clot, tumor, or extensive wall thickening from neurogenic bladder. Other: Prostatomegaly suspected. IMPRESSION: 1. No hydronephrosis. 2. Foley catheter noted in collapsed urinary bladder, heterogeneous masslike appearance around the catheter could be from intraluminal clot, tumor, or extensive wall thickening from neurogenic bladder. Cystoscopy may be indicated. 3. Prostatomegaly. Electronically Signed   By: WVan ClinesM.D.   On: 07/05/2022 18:04   ECHOCARDIOGRAM COMPLETE  Result Date: 07/04/2022    ECHOCARDIOGRAM REPORT   Patient Name:   CERVIL LAINHARTDate of Exam: 07/04/2022 Medical Rec #:  [id number]      Height:  73.0 in Accession #:    [id number]       Weight:       219.8 lb Date of Birth:  01/19/1946       BSA:          2.240 m Patient Age:    27 years        BP:           159/88 mmHg Patient Gender: M               HR:           73 bpm. Exam Location:  Inpatient Procedure: 2D Echo, Cardiac Doppler and Color Doppler                                MODIFIED REPORT:      This report was modified by Dorris Carnes MD on 07/04/2022 due to Complete                                   impression.  Indications:     bacteremia  History:         Patient has no prior history of Echocardiogram examinations.  Sonographer:     Phineas Douglas Referring Phys:  [place] Diagnosing Phys: Dorris Carnes MD IMPRESSIONS  1. No obvious vegetation though cannot completely exclude REcomm TEE if clinically indicated.  2. Left ventricular ejection fraction, by estimation, is 35%. The left ventricle demonstrates global hypokinesis. There is mild concentric left ventricular hypertrophy.  3. Right ventricular systolic function is normal. The right ventricular size is normal.  4. Trivial mitral valve regurgitation.  5. The aortic valve is tricuspid. Aortic valve regurgitation is trivial. FINDINGS  Left Ventricle: Left ventricular ejection fraction, by estimation, is 35%. The left ventricle demonstrates global hypokinesis. The left ventricular internal cavity size was normal in size. There is mild concentric left ventricular hypertrophy. Right Ventricle: The right ventricular size is normal. Right vetricular wall thickness was not assessed. Right ventricular systolic function is normal. Left Atrium: Left atrial size was normal in size. Right Atrium: Right atrial size was normal in size. Pericardium: There is no evidence of pericardial effusion. Mitral Valve: There is mild thickening of the mitral valve leaflet(s). Trivial mitral valve regurgitation. Tricuspid Valve: The tricuspid valve is normal in structure. Tricuspid valve regurgitation is trivial. Aortic Valve: The aortic valve is  tricuspid. Aortic valve regurgitation is trivial. Aortic regurgitation PHT measures 495 msec. Pulmonic Valve: The pulmonic valve was not well visualized. Pulmonic valve regurgitation is not visualized. No evidence of pulmonic stenosis. Aorta: The aortic root and ascending aorta are structurally normal, with no evidence of dilitation. IAS/Shunts: No atrial level shunt detected by color flow Doppler.  LEFT VENTRICLE PLAX 2D LVIDd:         5.30 cm      Diastology LVIDs:         3.90 cm      LV e' medial:    5.77 cm/s LV PW:         1.20 cm      LV E/e' medial:  14.9 LV IVS:        1.30 cm      LV e' lateral:   9.90 cm/s LVOT diam:     2.00 cm      LV E/e' lateral: 8.7 LV  SV:         66 LV SV Index:   29 LVOT Area:     3.14 cm  LV Volumes (MOD) LV vol d, MOD A2C: 139.0 ml LV vol d, MOD A4C: 166.0 ml LV vol s, MOD A2C: 75.0 ml LV vol s, MOD A4C: 90.6 ml LV SV MOD A2C:     64.0 ml LV SV MOD A4C:     166.0 ml LV SV MOD BP:      75.8 ml RIGHT VENTRICLE             IVC RV Basal diam:  4.00 cm     IVC diam: 1.70 cm RV S prime:     13.10 cm/s TAPSE (M-mode): 2.0 cm LEFT ATRIUM             Index        RIGHT ATRIUM           Index LA diam:        3.50 cm 1.56 cm/m   RA Area:     19.70 cm LA Vol (A2C):   68.4 ml 30.54 ml/m  RA Volume:   58.60 ml  26.16 ml/m LA Vol (A4C):   46.1 ml 20.58 ml/m LA Biplane Vol: 60.5 ml 27.01 ml/m  AORTIC VALVE LVOT Vmax:   101.00 cm/s LVOT Vmean:  71.500 cm/s LVOT VTI:    0.210 m AI PHT:      495 msec  AORTA Ao Root diam: 3.10 cm Ao Asc diam:  3.70 cm MITRAL VALVE MV Area (PHT): 4.39 cm    SHUNTS MV Decel Time: 173 msec    Systemic VTI:  0.21 m MV E velocity: 85.70 cm/s  Systemic Diam: 2.00 cm MV A velocity: 95.10 cm/s MV E/A ratio:  0.90 Dorris Carnes MD Electronically signed by Dorris Carnes MD Signature Date/Time: 07/04/2022/6:26:22 PM    Final (Updated)    MR PROSTATE W WO CONTRAST  Result Date: 06/24/2022 CLINICAL DATA:  A 77 year old male presents for evaluation of elevated PSA to 4.86.  EXAM: MR PROSTATE WITHOUT AND WITH CONTRAST TECHNIQUE: Multiplanar multis a 77 year old male with history of elevated PSA to 4.86. [facility] MRI images were obtained of the pelvis centered about the prostate. Pre and post contrast images were obtained. CONTRAST:  10 mL [place] COMPARISON:  Prior prostate MR from 2016. FINDINGS: Prostate: Prostatomegaly. Signs of BPH. Transitional zone: Area of vague T2 hypointensity on image 29/8 at the boundary of apical RIGHT paramidline transitional and peripheral zone. This measures 9 mm on image 30/6 and 10 mm on image 29/8. PIRADS category 4 displaying both hypointense features on T2 and marked restricted diffusion. Peripheral zone: Marked thinning of the peripheral zone without high-risk lesion. Volume: 157 cc. Transcapsular spread: Bulging of the anterior capsule on image 84/9 raising the question of extracapsular extension. Seminal vesicle involvement: Absent, atrophic bilateral seminal vesicles. Neurovascular bundle involvement: Absent Pelvic adenopathy: No lymph nodes in the expected pathway or usual pathway for prostate neoplasm. There are numerous rounded and enhancing lymph nodes in the mesorectum and superior rectal pathway (image 42/14 5 mm enhancing lymph node in this location with rounded morphology. (Image 24 and 25/14) 7 mm lymph node along superior rectal pathways. RIGHT pelvic sidewall lymph node 11 mm.  (Image 28/14) Bone metastasis: Absent Other findings: No visible rectal lesion which would be the most common cause of mesorectal lymph nodes. Rectum is decompressed. Trace fluid in the pelvis. IMPRESSION: 1. PIRADS category 4 lesion at  the boundary of the RIGHT paramidline apical transitional and peripheral zone. 2. Bulging of the anterior capsule raising the question of extracapsular extension. 3. Numerous rounded and enhancing lymph nodes in the mesorectum and superior rectal pathway. Findings are unusual for reactive lymph nodes and are usually seen in the  setting of rectal neoplasm. Consider correlation with recent colonoscopy if available or sigmoidoscopy to further evaluate if not recently performed. 4. Top-normal sized lymph node along the RIGHT pelvis also of uncertain significance. Imaging of the abdomen and pelvis may be helpful with direction of further workup as dictated by biopsy results. These results will be called to the ordering clinician or representative by the Radiologist Assistant, and communication documented in the PACS or [employer]. Electronically Signed   By: Zetta Bills M.D.   On: 06/24/2022 18:56    Microbiology: Results for orders placed or performed during the hospital encounter of 07/02/22  Urine Culture     Status: Abnormal   Collection Time: 07/02/22  9:42 AM   Specimen: Urine, Random  Result Value Ref Range Status   Specimen Description   Final    URINE, RANDOM Performed at [facility], [address] [address] [place], [place] [address]    Special Requests   Final    NONE Reflexed from [id number] Performed at [facility], [address] [place], [place] [address]    Culture >=100,000 COLONIES/mL STAPHYLOCOCCUS AUREUS (A)  Final   Report Status 07/04/2022 FINAL  Final   Organism ID, Bacteria STAPHYLOCOCCUS AUREUS (A)  Final      Susceptibility   Staphylococcus aureus - MIC*    CIPROFLOXACIN <=0.5 SENSITIVE Sensitive     GENTAMICIN <=0.5 SENSITIVE Sensitive     NITROFURANTOIN <=16 SENSITIVE Sensitive     OXACILLIN 0.5 SENSITIVE Sensitive     TETRACYCLINE <=1 SENSITIVE Sensitive     VANCOMYCIN <=0.5 SENSITIVE Sensitive     TRIMETH/SULFA <=10 SENSITIVE Sensitive     CLINDAMYCIN <=0.25 SENSITIVE Sensitive     RIFAMPIN <=0.5 SENSITIVE Sensitive     Inducible Clindamycin NEGATIVE Sensitive     * >=100,000 COLONIES/mL STAPHYLOCOCCUS AUREUS  Blood culture (routine x 2)     Status: Abnormal   Collection Time: 07/02/22 11:35 AM   Specimen: BLOOD  Result Value Ref Range  Status   Specimen Description   Final    BLOOD RIGHT ANTECUBITAL Performed at [facility], [address] [place], [place] [address]    Special Requests   Final    BOTTLES DRAWN AEROBIC AND ANAEROBIC Blood Culture adequate volume Performed at [facility], [address] [place], [place] [address]    Culture  Setup Time   Final    GRAM POSITIVE COCCI IN CLUSTERS IN BOTH AEROBIC AND ANAEROBIC BOTTLES Organism ID to follow CRITICAL RESULT CALLED TO, READ BACK BY AND VERIFIED WITH: M LILLISTON,PHARMD'@0555'$  07/03/22 [place] Performed at [facility], [place] [address] [place], [place] [address]    Culture STAPHYLOCOCCUS AUREUS (A)  Final   Report Status 07/05/2022 FINAL  Final   Organism ID, Bacteria STAPHYLOCOCCUS AUREUS  Final      Susceptibility   Staphylococcus aureus - MIC*    CIPROFLOXACIN <=0.5 SENSITIVE Sensitive     ERYTHROMYCIN <=0.25 SENSITIVE Sensitive     GENTAMICIN <=0.5 SENSITIVE Sensitive     OXACILLIN 0.5 SENSITIVE Sensitive     TETRACYCLINE <=1 SENSITIVE Sensitive     VANCOMYCIN 1 SENSITIVE Sensitive     TRIMETH/SULFA <=10 SENSITIVE Sensitive  CLINDAMYCIN <=0.25 SENSITIVE Sensitive     RIFAMPIN <=0.5 SENSITIVE Sensitive     Inducible Clindamycin NEGATIVE Sensitive     * STAPHYLOCOCCUS AUREUS  Blood Culture ID Panel (Reflexed)     Status: Abnormal   Collection Time: 07/02/22 11:35 AM  Result Value Ref Range Status   Enterococcus faecalis NOT DETECTED NOT DETECTED Final   Enterococcus Faecium NOT DETECTED NOT DETECTED Final   Listeria monocytogenes NOT DETECTED NOT DETECTED Final   Staphylococcus species DETECTED (A) NOT DETECTED Final    Comment: CRITICAL RESULT CALLED TO, READ BACK BY AND VERIFIED WITH: M LILLISTON,PHARMD'@0555'$  07/03/22 [place]    Staphylococcus aureus (BCID) DETECTED (A) NOT DETECTED Final    Comment: CRITICAL RESULT CALLED TO, READ BACK BY AND VERIFIED WITH: M LILLISTON,PHARMD'@0555'$  07/03/22 [place]     Staphylococcus epidermidis NOT DETECTED NOT DETECTED Final   Staphylococcus lugdunensis NOT DETECTED NOT DETECTED Final   Streptococcus species NOT DETECTED NOT DETECTED Final   Streptococcus agalactiae NOT DETECTED NOT DETECTED Final   Streptococcus pneumoniae NOT DETECTED NOT DETECTED Final   Streptococcus pyogenes NOT DETECTED NOT DETECTED Final   A.calcoaceticus-baumannii NOT DETECTED NOT DETECTED Final   Bacteroides fragilis NOT DETECTED NOT DETECTED Final   Enterobacterales NOT DETECTED NOT DETECTED Final   Enterobacter cloacae complex NOT DETECTED NOT DETECTED Final   Escherichia coli NOT DETECTED NOT DETECTED Final   Klebsiella aerogenes NOT DETECTED NOT DETECTED Final   Klebsiella oxytoca NOT DETECTED NOT DETECTED Final   Klebsiella pneumoniae NOT DETECTED NOT DETECTED Final   Proteus species NOT DETECTED NOT DETECTED Final   Salmonella species NOT DETECTED NOT DETECTED Final   Serratia marcescens NOT DETECTED NOT DETECTED Final   Haemophilus influenzae NOT DETECTED NOT DETECTED Final   Neisseria meningitidis NOT DETECTED NOT DETECTED Final   Pseudomonas aeruginosa NOT DETECTED NOT DETECTED Final   Stenotrophomonas maltophilia NOT DETECTED NOT DETECTED Final   Candida albicans NOT DETECTED NOT DETECTED Final   Candida auris NOT DETECTED NOT DETECTED Final   Candida glabrata NOT DETECTED NOT DETECTED Final   Candida krusei NOT DETECTED NOT DETECTED Final   Candida parapsilosis NOT DETECTED NOT DETECTED Final   Candida tropicalis NOT DETECTED NOT DETECTED Final   Cryptococcus neoformans/gattii NOT DETECTED NOT DETECTED Final   Meth resistant mecA/C and MREJ NOT DETECTED NOT DETECTED Final    Comment: Performed at [facility], [address], [place], [place] [address]  Blood culture (routine x 2)     Status: Abnormal   Collection Time: 07/02/22 11:42 AM   Specimen: BLOOD RIGHT HAND  Result Value Ref Range Status   Specimen Description   Final    BLOOD RIGHT  HAND Performed at [facility], [address], [place], [place] [address]    Special Requests   Final    BOTTLES DRAWN AEROBIC AND ANAEROBIC Blood Culture adequate volume Performed at [facility], [address] [place], [place] [address]    Culture  Setup Time   Final    GRAM POSITIVE COCCI IN CLUSTERS IN BOTH AEROBIC AND ANAEROBIC BOTTLES CRITICAL VALUE NOTED.  VALUE IS CONSISTENT WITH PREVIOUSLY REPORTED AND CALLED VALUE.    Culture (A)  Final    STAPHYLOCOCCUS AUREUS SUSCEPTIBILITIES PERFORMED ON PREVIOUS CULTURE WITHIN THE LAST 5 DAYS. Performed at [facility], [place] [address], [place], [place] [address]    Report Status 07/05/2022 FINAL  Final  Culture, blood (Routine X 2) w Reflex to ID Panel  Status: None   Collection Time: 07/04/22  4:48 AM   Specimen: BLOOD  Result Value Ref Range Status   Specimen Description   Final    BLOOD BLOOD LEFT ARM Performed at [facility] [address], [place], [place] [address]    Special Requests   Final    BOTTLES DRAWN AEROBIC ONLY Blood Culture adequate volume Performed at [facility] [address] [place], [place] [address]    Culture   Final    NO GROWTH 5 DAYS Performed at [facility], [place] [address], [place], [place] [address]    Report Status 07/09/2022 FINAL  Final  Culture, blood (Routine X 2) w Reflex to ID Panel     Status: None   Collection Time: 07/04/22  4:48 AM   Specimen: BLOOD  Result Value Ref Range Status   Specimen Description   Final    BLOOD BLOOD RIGHT ARM Performed at [facility] [address], [place], [place] [address]    Special Requests   Final    BOTTLES DRAWN AEROBIC ONLY Blood Culture adequate volume Performed at [facility] [address], [place], [place] [address]    Culture   Final    NO GROWTH 5 DAYS Performed at [facility], [place]  [address], [place], [place] [address]    Report Status 07/09/2022 FINAL  Final  MRSA Next Gen by PCR, Nasal     Status: None   Collection Time: 07/05/22 11:22 PM   Specimen: Nasal Mucosa; Nasal Swab  Result Value Ref Range Status   MRSA by PCR Next Gen NOT DETECTED NOT DETECTED Final    Comment: (NOTE) The GeneXpert MRSA Assay (FDA approved for NASAL specimens only), is one component of a comprehensive MRSA colonization surveillance program. It is not intended to diagnose MRSA infection nor to guide or monitor treatment for MRSA infections. Test performance is not FDA approved in patients less than 24 years old. Performed at [facility], [place] [address], [place], [place] [address]     Labs: CBC: Recent Labs  Lab 07/04/22 0448 07/05/22 0518 07/05/22 1737 07/05/22 1829 07/06/22 0249 07/06/22 1544 07/07/22 0307 07/07/22 1717 07/08/22 0453 07/09/22 0208 07/10/22 0152  WBC 6.3 6.9 9.0   < > 9.2  --  9.4  --  6.9 8.8 9.6  NEUTROABS 3.7 2.9 6.4  --  6.8  --   --   --   --   --   --   HGB 10.2* 10.0* 9.7*   < > 7.7*   < > 7.0* 8.1* 7.9* 7.9* 7.5*  HCT 32.0* 30.6* 30.6*   < > 23.7*   < > 23.6* 25.2* 23.9* 23.4* 22.9*  MCV 97.0 93.6 95.9   < > 95.2  --  101.7*  --  93.0 92.9 95.0  PLT 129* 141* 148*   < > 142*  --  146*  --  145* 142* 162   < > = values in this interval not displayed.   Basic Metabolic Panel: Recent Labs  Lab 07/04/22 0448 07/05/22 0518 07/05/22 1829 07/06/22 0249 07/07/22 0307 07/08/22 0453 07/09/22 0208 07/10/22 0152  NA 136 136   < > 135 134* 134* 132* 135  K 3.9 4.2   < > 4.7 3.9 3.9 3.8 3.4*  CL 106 105  --  104 105 103 102 105  CO2 22 24  --  23 21* 23  24 25  GLUCOSE 110* 112*  --  156* 108* 102* 124* 108*  BUN 14 14  --  25* 29* 24* 14 15  CREATININE 0.98 0.94  --  1.68* 1.93* 1.73* 0.89 1.07  CALCIUM 8.0* 8.3*  --  8.5* 8.1* 8.1* 7.9* 7.9*  MG 1.9 2.0  --  2.1  --   --   --   --    < > = values in this interval not displayed.    Liver Function Tests: Recent Labs  Lab 07/06/22 0249 07/07/22 0307  AST 21 19  ALT 14 8  ALKPHOS 39 35*  BILITOT 0.4 0.6  PROT 6.1* 5.8*  ALBUMIN 3.4* 3.3*   CBG: No results for input(s): "GLUCAP" in the last 168 hours.  Discharge time spent: approximately 35 minutes spent on discharge counseling, evaluation of patient on day of discharge, and coordination of discharge planning with nursing, social work, pharmacy and case management  Signed: Edwin Dada, MD [facility] 07/10/2022

## 2022-07-11 ENCOUNTER — Encounter (HOSPITAL_COMMUNITY): Payer: Self-pay | Admitting: Cardiology

## 2022-07-13 ENCOUNTER — Ambulatory Visit (INDEPENDENT_AMBULATORY_CARE_PROVIDER_SITE_OTHER): Payer: Medicare Other | Admitting: Urology

## 2022-07-13 ENCOUNTER — Encounter: Payer: Self-pay | Admitting: Urology

## 2022-07-13 VITALS — BP 164/78 | HR 72 | Ht 73.0 in | Wt 220.0 lb

## 2022-07-13 DIAGNOSIS — R590 Localized enlarged lymph nodes: Secondary | ICD-10-CM

## 2022-07-13 DIAGNOSIS — R972 Elevated prostate specific antigen [PSA]: Secondary | ICD-10-CM

## 2022-07-13 DIAGNOSIS — N401 Enlarged prostate with lower urinary tract symptoms: Secondary | ICD-10-CM | POA: Diagnosis not present

## 2022-07-13 DIAGNOSIS — R310 Gross hematuria: Secondary | ICD-10-CM | POA: Diagnosis not present

## 2022-07-13 DIAGNOSIS — R339 Retention of urine, unspecified: Secondary | ICD-10-CM | POA: Diagnosis not present

## 2022-07-13 NOTE — Progress Notes (Signed)
Assessment: 1.Gross hematuria/Clott retention: S/p cysto/clot evac/fulguration on 07/05/2022. Identified trauma in prostatic urethra and right bladder neck, also blood effluxing from RUO. Renal u/s 3/5 with no upper tract abnormality. CTU with mild right hydro and debris in ureter.  2.  Pelvic adenopathy (mesorectal concerning for possible rectal neoplasm)  3.  Elevated PSA/PIRADS 4 prostate lesion on MRI 06/2022. Was scheduled for MRI fusion biopsy /postponed due to admission with UTI.  4.   BPH/retention: Developed urinary retention in 06/2022 requiring foley placement outpatient  Plan: Today I had a long and detailed discussion with the patient and his wife regarding his complex urologic issues.  Foley removed today (could not void)-  taught CIC- he will cath 4x daily and prn Continue tam + fin (will double up on the tamsulosin for now  Will arrange mri fusion guided bx of prostate  FU 1 week for recheck  Await GI eval/colonoscopy scheduled next month  Will need repeat cysto, rpg and likely ureteroscopy to evaluation right ureteral abnl including finding of bloody efflux from right UO  Total time providing care during visit today= 58 min  Chief Complaint: Urinary retention  HPI: Steven Carter is a 77 y.o. male who presents for continued evaluation of a number of urologic issues.  Patient has longstanding BPH managed chronically with finasteride and tamsulosin.  The patient apparently ran out of tamsulosin 06/2022 and subsequently presented with urinary retention requiring catheter placement on 06/21/2022.  He has been followed by Dr. Abner Greenspan at [facility] urology.  He subsequently failed follow-up voiding trial.  Patient also has an elevated PSA (04/2022 =4.96 25% free on finasteride) and underwent further evaluation with multiparametric MRI.  This demonstrated a PI-RADS 4 lesion at the boundary of the right midline apical transition and peripheral zone with some bulging of the anterior  capsule in that region. In addition the patient was found to have abnormal adenopathy involving the mesorectum and superior rectal pathway which would be unusual for reactive nodes or prostate cancer and are more typical of a potential rectal neoplasm. MRI prostate volume= 157m  Patient was subsequently scheduled for MRI fusion guided biopsy however in the interim he developed staph pyelonephritis/urosepsis requiring admission.  He also developed clot retention while in the hospital and underwent clot evacuation with fulguration of some bleeding points at the bladder neck.  At that time he was also noted to have bloody E flux from the right ureteral opening.  CT hematuria evaluation did demonstrate delayed excretion and no significant contrast within the mildly dilated RIGHT ureter.    Allergies: No Known Allergies  PMH: Past Medical History:  Diagnosis Date   BPH (benign prostatic hyperplasia)    Chronic HFrEF (heart failure with reduced ejection fraction) (HCC)    Hypothyroidism    Thyroid disease     PSH: Past Surgical History:  Procedure Laterality Date   CYSTOSCOPY WITH FULGERATION N/A 07/05/2022   Procedure: CYSTOSCOPY WITH FULGERATION, CLOT EVACUATION;  Surgeon: HPamala Hurry MD;  Location: [facility];  Service: Urology;  Laterality: N/A;   TEE WITHOUT CARDIOVERSION N/A 07/07/2022   Procedure: TRANSESOPHAGEAL ECHOCARDIOGRAM (TEE);  Surgeon: CBuford Dresser MD;  [facility];  Service: Cardiovascular;  Laterality: N/A;    SH: Social History   Tobacco Use   Smoking status: Never   Smokeless tobacco: Never  Substance Use Topics   Alcohol use: Yes    Alcohol/week: 7.0 standard drinks of alcohol    Types: 7 Glasses of wine per week  Drug use: Never    ROS: Constitutional:  Negative for fever, chills, weight loss CV: Negative for chest pain, previous MI, hypertension Respiratory:  Negative for shortness of breath, wheezing, sleep apnea, frequent  cough GI:  Negative for nausea, vomiting, bloody stool, GERD  PE: BP (!) 164/78   Pulse 72   Ht '6\' 1"'$  (1.854 m)   Wt 220 lb (99.8 kg)   BMI 29.03 kg/m  GENERAL APPEARANCE:  pale appearing, well developed, well nourished, NAD

## 2022-07-14 ENCOUNTER — Telehealth: Payer: Self-pay | Admitting: Urology

## 2022-07-14 DIAGNOSIS — R338 Other retention of urine: Secondary | ICD-10-CM | POA: Diagnosis not present

## 2022-07-14 NOTE — Telephone Encounter (Signed)
Wife left msg that they have just returned from [place] where they replaced a foley with a 22g coude. They emptied out 1L of urine. Wife reports not doing well with the 61F catheters. Please advise.

## 2022-07-14 NOTE — Telephone Encounter (Signed)
Patient called and stated he needs a new cath due to some bleeding & clotting. Patient requested a return call asap. - lmr.

## 2022-07-14 NOTE — Telephone Encounter (Signed)
Left msg for a return call from patient.  

## 2022-07-14 NOTE — Telephone Encounter (Signed)
Left detailed msg on VM. Advised patient to call back with any questions/concerns.

## 2022-07-15 DIAGNOSIS — L570 Actinic keratosis: Secondary | ICD-10-CM | POA: Diagnosis not present

## 2022-07-18 ENCOUNTER — Telehealth: Payer: Self-pay

## 2022-07-18 DIAGNOSIS — R591 Generalized enlarged lymph nodes: Secondary | ICD-10-CM | POA: Diagnosis not present

## 2022-07-18 DIAGNOSIS — R000 Tachycardia, unspecified: Secondary | ICD-10-CM | POA: Diagnosis not present

## 2022-07-18 DIAGNOSIS — R310 Gross hematuria: Secondary | ICD-10-CM | POA: Diagnosis not present

## 2022-07-18 DIAGNOSIS — Z792 Long term (current) use of antibiotics: Secondary | ICD-10-CM | POA: Diagnosis not present

## 2022-07-18 DIAGNOSIS — B9561 Methicillin susceptible Staphylococcus aureus infection as the cause of diseases classified elsewhere: Secondary | ICD-10-CM | POA: Diagnosis not present

## 2022-07-18 DIAGNOSIS — R338 Other retention of urine: Secondary | ICD-10-CM | POA: Diagnosis not present

## 2022-07-18 DIAGNOSIS — R7881 Bacteremia: Secondary | ICD-10-CM | POA: Diagnosis not present

## 2022-07-18 DIAGNOSIS — I110 Hypertensive heart disease with heart failure: Secondary | ICD-10-CM | POA: Diagnosis not present

## 2022-07-18 DIAGNOSIS — N390 Urinary tract infection, site not specified: Secondary | ICD-10-CM | POA: Diagnosis not present

## 2022-07-18 DIAGNOSIS — D62 Acute posthemorrhagic anemia: Secondary | ICD-10-CM | POA: Diagnosis not present

## 2022-07-18 DIAGNOSIS — Z466 Encounter for fitting and adjustment of urinary device: Secondary | ICD-10-CM | POA: Diagnosis not present

## 2022-07-18 DIAGNOSIS — Z452 Encounter for adjustment and management of vascular access device: Secondary | ICD-10-CM | POA: Diagnosis not present

## 2022-07-18 DIAGNOSIS — N401 Enlarged prostate with lower urinary tract symptoms: Secondary | ICD-10-CM | POA: Diagnosis not present

## 2022-07-18 DIAGNOSIS — Z556 Problems related to health literacy: Secondary | ICD-10-CM | POA: Diagnosis not present

## 2022-07-18 DIAGNOSIS — I5023 Acute on chronic systolic (congestive) heart failure: Secondary | ICD-10-CM | POA: Diagnosis not present

## 2022-07-18 DIAGNOSIS — E039 Hypothyroidism, unspecified: Secondary | ICD-10-CM | POA: Diagnosis not present

## 2022-07-18 NOTE — Telephone Encounter (Signed)
Wife left msg about getting scheduled for biopsy. I do not see a referral for the MRI fusion biopsy that you stated needed to be done. Please place order.

## 2022-07-19 ENCOUNTER — Ambulatory Visit (INDEPENDENT_AMBULATORY_CARE_PROVIDER_SITE_OTHER): Payer: Medicare Other | Admitting: Infectious Diseases

## 2022-07-19 ENCOUNTER — Telehealth: Payer: Self-pay

## 2022-07-19 ENCOUNTER — Encounter: Payer: Self-pay | Admitting: Infectious Diseases

## 2022-07-19 ENCOUNTER — Other Ambulatory Visit: Payer: Self-pay

## 2022-07-19 ENCOUNTER — Other Ambulatory Visit: Payer: Self-pay | Admitting: Urology

## 2022-07-19 VITALS — BP 137/78 | HR 74 | Resp 16 | Ht 73.0 in | Wt 221.0 lb

## 2022-07-19 DIAGNOSIS — Z452 Encounter for adjustment and management of vascular access device: Secondary | ICD-10-CM

## 2022-07-19 DIAGNOSIS — Z79899 Other long term (current) drug therapy: Secondary | ICD-10-CM | POA: Diagnosis not present

## 2022-07-19 DIAGNOSIS — N3001 Acute cystitis with hematuria: Secondary | ICD-10-CM

## 2022-07-19 DIAGNOSIS — R7881 Bacteremia: Secondary | ICD-10-CM

## 2022-07-19 DIAGNOSIS — B9561 Methicillin susceptible Staphylococcus aureus infection as the cause of diseases classified elsewhere: Secondary | ICD-10-CM

## 2022-07-19 DIAGNOSIS — R590 Localized enlarged lymph nodes: Secondary | ICD-10-CM

## 2022-07-19 DIAGNOSIS — R972 Elevated prostate specific antigen [PSA]: Secondary | ICD-10-CM

## 2022-07-19 NOTE — Telephone Encounter (Signed)
Sent to [facility]: Per provider ok to [place]  Today ASAP Provider: Dr West Bali  End Date:07/18/2022   Notified [place] to contact [facility].

## 2022-07-19 NOTE — Telephone Encounter (Signed)
Patient wife Emerzon Vanderwood contacted our office stating that she hasn't heard from [facility] agency regarding pulling PICC line for patient.    Contacted [employer] and spoke with Driscoll hasn't reached out to [place] yet as they was unsure of which [place] patient is using.    Verbal orders given to [employer], Therapist, sports at [employer], per Dr.Manandhar pull PICC line today, EOT 07/18/2022.   Amber, RN voiced her understanding.   Informed patient wife verbal orders have been given to [facility].   [place], CMA

## 2022-07-19 NOTE — Progress Notes (Incomplete)
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   CEFAZOLIN (ANCEF) IVPB    Inject 2 g into the vein every 8 (eight) hours for 11 days. Indication:  MSSA bacteremia First Dose: Yes Last Day of Therapy:  07/18/22 Labs - Once weekly:  CBC/D and BMP, Labs - Every other week:  ESR and CRP Method of administration: IV Push Pull PICC/midline at the completion of IV therapy Method of administration may be changed at the discretion of home infusion pharmacist based upon assessment of the patient and/or caregiver's ability to self-administer the medication ordered.   FINASTERIDE (PROSCAR) 5 MG TABLET    Take 5 mg by mouth daily.   LEVOTHYROXINE (SYNTHROID) 112 MCG TABLET    Take 150 mcg by mouth daily.   LINEZOLID (ZYVOX) 600 MG TABLET    Take 1 tablet (600 mg total) by mouth 2 (two) times daily for 14 days. Start on 07/19/22 after completion of IV antibiotics   LOSARTAN (COZAAR) 25 MG TABLET    Take 1 tablet (25 mg total) by mouth daily.   OXYBUTYNIN (DITROPAN-XL) 5 MG 24 HR TABLET    Take 5 mg by mouth daily.   SPIRONOLACTONE (ALDACTONE) 25 MG TABLET    Take 0.5 tablets (12.5 mg total) by mouth daily.   TAMSULOSIN (FLOMAX) 0.4 MG CAPS CAPSULE    Take 0.8 mg by mouth daily.  Modified Medications   No medications on file  Discontinued Medications   LEVOFLOXACIN (LEVAQUIN) 750 MG TABLET    Take 1 tablet (750 mg total) by mouth  daily. Ask your doctor about how to take this    Subjective: 77 year old male with PMH as below including BPH, Hypothyroidism as well as recent episode of urinary retention requiring Foley catheter placement with also concern for rectal neoplasm versus locally invasive prostate neoplasm undergoing malignancy work up who is here for HFU for complicated MSSA bacteremia/UTI in the setting of urologic instrumentation. Was discharged on 3/10 with planned iV cefazolin until 3/18 to be followed by linezolid for 2 weeks thereafter  07/19/22 Accompanied by his wife. Completed IV cefazolin yesterday. PICC in rt arm with no concerns. Started linezolid this morning. Denies any concerns with the abtx like nausea, vomiting, rashes and diarrhea.  Was seen by Urology 3/13 where he was unable to void and plan for MRI fusion guided prostate biopsy. Still has a foleys as he could not void. He has not seen GI yet. No concerns otherwise.   Review of Systems: all systems reviewed with pertinent positives and negatives as listed above   Past Medical History:  Diagnosis Date   BPH (benign prostatic hyperplasia)    Chronic HFrEF (heart failure with reduced ejection fraction) ([place])    Hypothyroidism    Thyroid disease    Past Surgical History:  Procedure Laterality Date   CYSTOSCOPY WITH FULGERATION N/A 07/05/2022   Procedure: CYSTOSCOPY WITH FULGERATION, CLOT  EVACUATION;  Surgeon: Pamala Hurry, MD;  Location: [facility];  Service: Urology;  Laterality: N/A;   TEE WITHOUT CARDIOVERSION N/A 07/07/2022   Procedure: TRANSESOPHAGEAL ECHOCARDIOGRAM (TEE);  Surgeon: Buford Dresser, MD;  Location: [facility];  Service: Cardiovascular;  Laterality: N/A;    Social History   Tobacco Use   Smoking status: Never   Smokeless tobacco: Never  Substance Use Topics   Alcohol use: Yes    Alcohol/week: 7.0 standard drinks of alcohol    Types: 7 Glasses of wine per week   Drug use: Never    No family history on  file.  No Known Allergies  Health Maintenance  Topic Date Due   Medicare Annual Wellness (AWV)  Never done   Hepatitis C Screening  Never done   DTaP/Tdap/Td (1 - Tdap) Never done   Zoster Vaccines- Shingrix (1 of 2) Never done   COLONOSCOPY (Pts 45-44yrs Insurance coverage will need to be confirmed)  Never done   Pneumonia Vaccine 72+ Years old (1 of 1 - PCV) Never done   COVID-19 Vaccine (2 - Pfizer risk series) 08/22/2019   INFLUENZA VACCINE  Completed   HPV VACCINES  Aged Out    Objective: BP 137/78   Pulse 74   Resp 16   Ht 6\' 1"  (1.854 m)   Wt 221 lb (100.2 kg)   SpO2 100%   BMI 29.16 kg/m   Physical Exam Constitutional:      Appearance: Normal appearance.  HENT:     Head: Normocephalic and atraumatic.      Mouth: Mucous membranes are moist.  Eyes:    Conjunctiva/sclera: Conjunctivae normal.     Pupils: Pupils are equal, round, and bilaterally symmetrical   Cardiovascular:     Rate and Rhythm: Normal rate and regular rhythm.     Heart sounds: s1s2   Pulmonary:     Effort: Pulmonary effort is normal.     Breath sounds: Normal breath sounds.   Abdominal:     General: Non distended     Palpations: soft. Foleys with a urobag  Musculoskeletal:        General: Normal range of motion.   Skin:    General: Skin is warm and dry.     Comments:  Neurological:     General: grossly non focal     Mental Status: awake, alert and oriented to person, place, and time.   Psychiatric:        Mood and Affect: Mood normal.   Lab Results Lab Results  Component Value Date   WBC 9.6 07/10/2022   HGB 7.5 (L) 07/10/2022   HCT 22.9 (L) 07/10/2022   MCV 95.0 07/10/2022   PLT 162 07/10/2022    Lab Results  Component Value Date   CREATININE 1.07 07/10/2022   BUN 15 07/10/2022   NA 135 07/10/2022   K 3.4 (L) 07/10/2022   CL 105 07/10/2022   CO2 25 07/10/2022    Lab Results  Component Value Date   ALT 8 07/07/2022   AST 19 07/07/2022   ALKPHOS 35 (L)  07/07/2022   BILITOT 0.6 07/07/2022    Lab Results  Component Value Date   CHOL 85 07/06/2022   HDL 18 (L) 07/06/2022   LDLCALC 55 07/06/2022   TRIG 59 07/06/2022   CHOLHDL 4.7 07/06/2022   No results found for: "LABRPR", "RPRTITER" No results found for: "HIV1RNAQUANT", "HIV1RNAVL", "CD4TABS"  Assessment/Plan  # MSSA bacteremia likely 2/2 MSSA UTI in the setting of  urologoc instrumentation - Repeat blood cx 3/4  - 3/4 TTE negative for vegetations - 4/7 TEE negative for vegetations or endocarditis  - completed 2 weeks of IV cefazolin 3/18  Plan - continue linezolid for 2 weeks from 3/19 - Labs today - Fu in 2 weeks    # Gross hematuria  # Prostate lesion on MRI 06/2022/elevated PSA # BPH - 3/5 Cystourethroscopy, Clot Evacuation with Fulguration  - Urology following, still has Foley's after failing voiding trial  - Plan for prostate biopy   # Mesenteric lymphadenopathy  - Discussed about following up with GI for further work up.    I have personally spent 42 minutes involved in face-to-face and non-face-to-face activities for this patient on the day of the visit. Professional time spent includes the following activities: Preparing to see the patient (review of tests), Obtaining and/or reviewing separately obtained history (admission/discharge record), Performing a medically appropriate examination and/or evaluation , Ordering medications/tests/procedures, referring and communicating with other health care professionals, Documenting clinical information in the EMR, Independently interpreting results (not separately reported), Communicating results to the patient/family/caregiver, Counseling and educating the patient/family/caregiver and Care coordination (not separately reported).   Wilber Oliphant, [facility] 07/19/2022, 9:42 AM

## 2022-07-20 ENCOUNTER — Ambulatory Visit (INDEPENDENT_AMBULATORY_CARE_PROVIDER_SITE_OTHER): Payer: Medicare Other | Admitting: Urology

## 2022-07-20 ENCOUNTER — Encounter: Payer: Self-pay | Admitting: Urology

## 2022-07-20 VITALS — BP 149/76 | HR 76 | Wt 221.0 lb

## 2022-07-20 DIAGNOSIS — E039 Hypothyroidism, unspecified: Secondary | ICD-10-CM | POA: Diagnosis not present

## 2022-07-20 DIAGNOSIS — Z466 Encounter for fitting and adjustment of urinary device: Secondary | ICD-10-CM | POA: Diagnosis not present

## 2022-07-20 DIAGNOSIS — R7881 Bacteremia: Secondary | ICD-10-CM | POA: Diagnosis not present

## 2022-07-20 DIAGNOSIS — N401 Enlarged prostate with lower urinary tract symptoms: Secondary | ICD-10-CM | POA: Diagnosis not present

## 2022-07-20 DIAGNOSIS — R339 Retention of urine, unspecified: Secondary | ICD-10-CM

## 2022-07-20 DIAGNOSIS — Z452 Encounter for adjustment and management of vascular access device: Secondary | ICD-10-CM | POA: Diagnosis not present

## 2022-07-20 DIAGNOSIS — R972 Elevated prostate specific antigen [PSA]: Secondary | ICD-10-CM | POA: Diagnosis not present

## 2022-07-20 DIAGNOSIS — I110 Hypertensive heart disease with heart failure: Secondary | ICD-10-CM | POA: Diagnosis not present

## 2022-07-20 DIAGNOSIS — I5023 Acute on chronic systolic (congestive) heart failure: Secondary | ICD-10-CM | POA: Diagnosis not present

## 2022-07-20 DIAGNOSIS — Z792 Long term (current) use of antibiotics: Secondary | ICD-10-CM | POA: Diagnosis not present

## 2022-07-20 DIAGNOSIS — R030 Elevated blood-pressure reading, without diagnosis of hypertension: Secondary | ICD-10-CM | POA: Diagnosis not present

## 2022-07-20 DIAGNOSIS — D62 Acute posthemorrhagic anemia: Secondary | ICD-10-CM | POA: Diagnosis not present

## 2022-07-20 DIAGNOSIS — R338 Other retention of urine: Secondary | ICD-10-CM | POA: Diagnosis not present

## 2022-07-20 DIAGNOSIS — E559 Vitamin D deficiency, unspecified: Secondary | ICD-10-CM | POA: Diagnosis not present

## 2022-07-20 DIAGNOSIS — B9561 Methicillin susceptible Staphylococcus aureus infection as the cause of diseases classified elsewhere: Secondary | ICD-10-CM | POA: Diagnosis not present

## 2022-07-20 DIAGNOSIS — R000 Tachycardia, unspecified: Secondary | ICD-10-CM | POA: Diagnosis not present

## 2022-07-20 DIAGNOSIS — Z556 Problems related to health literacy: Secondary | ICD-10-CM | POA: Diagnosis not present

## 2022-07-20 DIAGNOSIS — N390 Urinary tract infection, site not specified: Secondary | ICD-10-CM | POA: Diagnosis not present

## 2022-07-20 DIAGNOSIS — R310 Gross hematuria: Secondary | ICD-10-CM | POA: Diagnosis not present

## 2022-07-20 DIAGNOSIS — N400 Enlarged prostate without lower urinary tract symptoms: Secondary | ICD-10-CM | POA: Diagnosis not present

## 2022-07-20 DIAGNOSIS — R591 Generalized enlarged lymph nodes: Secondary | ICD-10-CM | POA: Diagnosis not present

## 2022-07-20 LAB — BASIC METABOLIC PANEL
BUN: 13 mg/dL (ref 7–25)
CO2: 28 mmol/L (ref 20–32)
Calcium: 9.3 mg/dL (ref 8.6–10.3)
Chloride: 100 mmol/L (ref 98–110)
Creat: 0.96 mg/dL (ref 0.70–1.28)
Glucose, Bld: 96 mg/dL (ref 65–99)
Potassium: 4.8 mmol/L (ref 3.5–5.3)
Sodium: 138 mmol/L (ref 135–146)

## 2022-07-20 LAB — CBC
HCT: 28.9 % — ABNORMAL LOW (ref 38.5–50.0)
Hemoglobin: 9.3 g/dL — ABNORMAL LOW (ref 13.2–17.1)
MCH: 30.2 pg (ref 27.0–33.0)
MCHC: 32.2 g/dL (ref 32.0–36.0)
MCV: 93.8 fL (ref 80.0–100.0)
MPV: 10.9 fL (ref 7.5–12.5)
Platelets: 198 10*3/uL (ref 140–400)
RBC: 3.08 10*6/uL — ABNORMAL LOW (ref 4.20–5.80)
RDW: 14.6 % (ref 11.0–15.0)
WBC: 6.9 10*3/uL (ref 3.8–10.8)

## 2022-07-20 NOTE — Progress Notes (Signed)
   Assessment: 1. Urinary retention   2. Benign localized prostatic hyperplasia with lower urinary tract symptoms (LUTS)   3. Elevated PSA     Plan: Patient will follow-up with Dr. Tresa Moore at [facility] for MRI fusion guided biopsy.  He was also in the process of being scheduled for colonoscopy and will return to Dr. Alanson Aly final treatment decisions and care.  Chief Complaint:  urinary retention   HPI: Steven Carter is a 77 y.o. male who presents for continued evaluation of urinary retention. Please see my multiple prior notes.  Last week we tried to start him on self cath however likely due to his large prostate he had difficulty catheterizing and subsequently had a Foley catheter replaced.  In talking to him and his wife today they do not want to consider restarting self cath and want to continue with the Foley catheter until his workup and treatment plan has been made.   Portions of the above documentation were copied from a prior visit for review purposes only.  Allergies: No Known Allergies  PMH: Past Medical History:  Diagnosis Date   BPH (benign prostatic hyperplasia)    Chronic HFrEF (heart failure with reduced ejection fraction) (HCC)    Hypothyroidism    Thyroid disease     PSH: Past Surgical History:  Procedure Laterality Date   CYSTOSCOPY WITH FULGERATION N/A 07/05/2022   Procedure: CYSTOSCOPY WITH FULGERATION, CLOT EVACUATION;  Surgeon: Pamala Hurry, MD;  Location: [facility];  Service: Urology;  Laterality: N/A;   TEE WITHOUT CARDIOVERSION N/A 07/07/2022   Procedure: TRANSESOPHAGEAL ECHOCARDIOGRAM (TEE);  Surgeon: Buford Dresser, MD;  Location: [facility] ENDOSCOPY;  Service: Cardiovascular;  Laterality: N/A;    SH: Social History   Tobacco Use   Smoking status: Never    Passive exposure: Never   Smokeless tobacco: Never  Substance Use Topics   Alcohol use: Yes    Alcohol/week: 7.0 standard drinks of alcohol    Types: 7 Glasses of wine per week   Drug  use: Never    ROS: Constitutional:  Negative for fever, chills, weight loss CV: Negative for chest pain, previous MI, hypertension Respiratory:  Negative for shortness of breath, wheezing, sleep apnea, frequent cough GI:  Negative for nausea, vomiting, bloody stool, GERD  PE: BP (!) 149/76   Pulse 76   Wt 221 lb (100.2 kg)   BMI 29.16 kg/m  GENERAL APPEARANCE:  Well appearing, well developed, well nourished, NAD

## 2022-07-21 ENCOUNTER — Ambulatory Visit: Payer: Medicare Other | Attending: Student | Admitting: Student

## 2022-07-21 ENCOUNTER — Encounter: Payer: Self-pay | Admitting: Student

## 2022-07-21 VITALS — BP 120/62 | HR 88 | Ht 73.0 in | Wt 219.4 lb

## 2022-07-21 DIAGNOSIS — R310 Gross hematuria: Secondary | ICD-10-CM | POA: Diagnosis not present

## 2022-07-21 DIAGNOSIS — D649 Anemia, unspecified: Secondary | ICD-10-CM | POA: Diagnosis not present

## 2022-07-21 DIAGNOSIS — I5022 Chronic systolic (congestive) heart failure: Secondary | ICD-10-CM

## 2022-07-21 DIAGNOSIS — Z01810 Encounter for preprocedural cardiovascular examination: Secondary | ICD-10-CM | POA: Diagnosis not present

## 2022-07-21 MED ORDER — METOPROLOL TARTRATE 100 MG PO TABS: ORAL_TABLET | ORAL | 0 refills | Status: DC

## 2022-07-21 NOTE — Patient Instructions (Addendum)
Medication Instructions:   TAKE Metoprolol Tartrate 100 mg 2 hours before Coronary CT  *If you need a refill on your cardiac medications before your next appointment, please call your pharmacy*  Lab Work: NONE ordered at this time of appointment   If you have labs (blood work) drawn today and your tests are completely normal, you will receive your results only by: [place] (if you have MyChart) OR A paper copy in the mail If you have any lab test that is abnormal or we need to change your treatment, we will call you to review the results.  Testing/Procedures: Sande Rives, PA-C has requested that you have an echocardiogram. Echocardiography is a painless test that uses sound waves to create images of your heart. It provides your doctor with information about the size and shape of your heart and how well your heart's chambers and valves are working. This procedure takes approximately one hour. There are no restrictions for this procedure. Please do NOT wear cologne, perfume, aftershave, or lotions (deodorant is allowed). Please arrive 15 minutes prior to your appointment time.  Callie oodrich, PA-C has requested that you have cardiac CT. Cardiac CT Angiography (CTA), is a special type of CT scan that uses a computer to produce multi-dimensional views of major blood vessels throughout the body. In CT angiography, a contrast material is injected through an IV to help visualize the blood vessels. A cardiac CT angiogram is a procedure to look at the heart and the area around the heart. It may be done to help find the cause of chest pains or other symptoms of heart disease. During this procedure, a substance called contrast dye is injected into a vein in the arm. The contrast highlights the blood vessels in the area to be checked. A large X-ray machine (CT scanner), then takes detailed pictures of the heart and the surrounding area. The procedure is also sometimes called a coronary CT  angiogram, coronary artery scanning, or CTA.  Follow-Up: At [facility], you and your health needs are our priority.  As part of our continuing mission to provide you with exceptional heart care, we have created designated Provider Care Teams.  These Care Teams include your primary Cardiologist (physician) and Advanced Practice Providers (APPs -  Physician Assistants and Nurse Practitioners) who all work together to provide you with the care you need, when you need it.  Your next appointment:   3-4 month(s)  Provider:   Berniece Salines, DO  or Sande Rives, PA-C        Other Instructions    Your cardiac CT will be scheduled at one of the below locations:   [facility] [address] [place], [place] [address] [phone]  [facility] [address] [place], [place] [address] [phone]  [facility] [place], [place] [address] [phone]  If scheduled at [facility], please arrive at the [facility] and Children's Entrance (Entrance C2) of [facility] 30 minutes prior to test start time. You can use the FREE valet parking offered at entrance C (encouraged to control the heart rate for the test)  Proceed to the [facility] Radiology Department (first floor) to check-in and test prep.  All radiology patients and guests should use entrance C2 at [facility], accessed from [facility], even though the hospital's physical address listed is [address].  If scheduled at [facility] or [facility], please arrive 15 mins early for check-in and test prep.   Please follow these instructions carefully (unless otherwise directed):  Hold all erectile dysfunction medications at least 3 days (72 hrs) prior to test. (Ie viagra, cialis, sildenafil, tadalafil, etc) We will  administer nitroglycerin during this exam.   On the Night Before the Test: Be sure to Drink plenty of water. Do not consume any caffeinated/decaffeinated beverages or chocolate 12 hours prior to your test. Do not take any antihistamines 12 hours prior to your test. If the patient has contrast allergy: Patient will need a prescription for Prednisone and very clear instructions (as follows): Prednisone 50 mg - take 13 hours prior to test Take another Prednisone 50 mg 7 hours prior to test Take another Prednisone 50 mg 1 hour prior to test Take Benadryl 50 mg 1 hour prior to test Patient must complete all four doses of above prophylactic medications. Patient will need a ride after test due to Benadryl.  On the Day of the Test: Drink plenty of water until 1 hour prior to the test. Do not eat any food 1 hour prior to test. You may take your regular medications prior to the test.  Take metoprolol (Lopressor) two hours prior to test. If you take Furosemide/Hydrochlorothiazide/Spironolactone, please HOLD on the morning of the test. FEMALES- please wear underwire-free bra if available, avoid dresses & tight clothing  After the Test: Drink plenty of water. After receiving IV contrast, you may experience a mild flushed feeling. This is normal. On occasion, you may experience a mild rash up to 24 hours after the test. This is not dangerous. If this occurs, you can take Benadryl 25 mg and increase your fluid intake. If you experience trouble breathing, this can be serious. If it is severe call 911 IMMEDIATELY. If it is mild, please call our office. If you take any of these medications: Glipizide/Metformin, Avandament, Glucavance, please do not take 48 hours after completing test unless otherwise instructed.  We will call to schedule your test 2-4 weeks out understanding that some insurance companies will need an authorization prior to the service being performed.   For non-scheduling related  questions, please contact the cardiac imaging nurse navigator should you have any questions/concerns: Marchia Bond, Cardiac Imaging Nurse Navigator Gordy Clement, Cardiac Imaging Nurse Navigator [facility] Direct Office Dial: [phone]   For scheduling needs, including cancellations and rescheduling, please call [place], [phone].

## 2022-07-25 DIAGNOSIS — I5023 Acute on chronic systolic (congestive) heart failure: Secondary | ICD-10-CM | POA: Diagnosis not present

## 2022-07-25 DIAGNOSIS — R7881 Bacteremia: Secondary | ICD-10-CM | POA: Diagnosis not present

## 2022-07-25 DIAGNOSIS — Z452 Encounter for adjustment and management of vascular access device: Secondary | ICD-10-CM | POA: Diagnosis not present

## 2022-07-25 DIAGNOSIS — E039 Hypothyroidism, unspecified: Secondary | ICD-10-CM | POA: Diagnosis not present

## 2022-07-25 DIAGNOSIS — Z556 Problems related to health literacy: Secondary | ICD-10-CM | POA: Diagnosis not present

## 2022-07-25 DIAGNOSIS — I110 Hypertensive heart disease with heart failure: Secondary | ICD-10-CM | POA: Diagnosis not present

## 2022-07-25 DIAGNOSIS — D62 Acute posthemorrhagic anemia: Secondary | ICD-10-CM | POA: Diagnosis not present

## 2022-07-25 DIAGNOSIS — R000 Tachycardia, unspecified: Secondary | ICD-10-CM | POA: Diagnosis not present

## 2022-07-25 DIAGNOSIS — R591 Generalized enlarged lymph nodes: Secondary | ICD-10-CM | POA: Diagnosis not present

## 2022-07-25 DIAGNOSIS — R310 Gross hematuria: Secondary | ICD-10-CM | POA: Diagnosis not present

## 2022-07-25 DIAGNOSIS — R338 Other retention of urine: Secondary | ICD-10-CM | POA: Diagnosis not present

## 2022-07-25 DIAGNOSIS — Z466 Encounter for fitting and adjustment of urinary device: Secondary | ICD-10-CM | POA: Diagnosis not present

## 2022-07-25 DIAGNOSIS — B9561 Methicillin susceptible Staphylococcus aureus infection as the cause of diseases classified elsewhere: Secondary | ICD-10-CM | POA: Diagnosis not present

## 2022-07-25 DIAGNOSIS — N401 Enlarged prostate with lower urinary tract symptoms: Secondary | ICD-10-CM | POA: Diagnosis not present

## 2022-07-25 DIAGNOSIS — Z792 Long term (current) use of antibiotics: Secondary | ICD-10-CM | POA: Diagnosis not present

## 2022-07-25 DIAGNOSIS — N390 Urinary tract infection, site not specified: Secondary | ICD-10-CM | POA: Diagnosis not present

## 2022-07-26 ENCOUNTER — Telehealth: Payer: Self-pay

## 2022-07-26 DIAGNOSIS — R972 Elevated prostate specific antigen [PSA]: Secondary | ICD-10-CM | POA: Diagnosis not present

## 2022-07-26 DIAGNOSIS — N411 Chronic prostatitis: Secondary | ICD-10-CM | POA: Diagnosis not present

## 2022-07-26 DIAGNOSIS — N4289 Other specified disorders of prostate: Secondary | ICD-10-CM | POA: Diagnosis not present

## 2022-07-26 DIAGNOSIS — N410 Acute prostatitis: Secondary | ICD-10-CM | POA: Diagnosis not present

## 2022-07-26 NOTE — Telephone Encounter (Signed)
Pt has ECHO scheduled for the end of April this needs to be moved to sometime in June. Scheduled for 6-20 @930am .  Patient notified, woill call back if needs to be rescheduled, verbalized understanding

## 2022-07-27 ENCOUNTER — Telehealth (HOSPITAL_COMMUNITY): Payer: Self-pay | Admitting: *Deleted

## 2022-07-27 NOTE — Telephone Encounter (Signed)
Reaching out to patient to offer assistance regarding upcoming cardiac imaging study; pt verbalizes understanding of appt date/time, parking situation and where to check in, pre-test NPO status and medications ordered, and verified current allergies; name and call back number provided for further questions should they arise ? ?  RN Navigator Cardiac Imaging ?[facility] ?[phone] office ?[phone] cell ? ?Patient to take 100mg metoprolol tartrate two hours prior to his cardiac CT scan.  He is aware to arrive at 9:30am. ?

## 2022-07-28 ENCOUNTER — Telehealth (HOSPITAL_COMMUNITY): Payer: Self-pay | Admitting: *Deleted

## 2022-07-28 ENCOUNTER — Ambulatory Visit (HOSPITAL_COMMUNITY)
Admission: RE | Admit: 2022-07-28 | Discharge: 2022-07-28 | Disposition: A | Discharge status: Home / Self Care | Payer: Medicare Other | Source: Ambulatory Visit | Attending: Student | Admitting: Student

## 2022-07-28 ENCOUNTER — Encounter (HOSPITAL_COMMUNITY): Payer: Self-pay

## 2022-07-28 DIAGNOSIS — I5022 Chronic systolic (congestive) heart failure: Secondary | ICD-10-CM | POA: Insufficient documentation

## 2022-07-28 MED ORDER — IVABRADINE HCL 7.5 MG PO TABS: ORAL_TABLET | ORAL | 0 refills | Status: DC

## 2022-07-28 MED ORDER — SODIUM CHLORIDE 0.9 % IV BOLUS
500.0000 mL | Freq: Once | INTRAVENOUS | Status: AC
  Administered 2022-07-28: 500 mL via INTRAVENOUS

## 2022-07-28 MED ORDER — NITROGLYCERIN 0.4 MG SL SUBL: 0.8000 mg | SUBLINGUAL_TABLET | Freq: Once | SUBLINGUAL | Status: DC

## 2022-07-28 NOTE — Progress Notes (Signed)
Patient here for CT heart. Heart rate 66 and SBP 103 and then 100. Does not feel well since taking Metoprolol 100 mg 2 hours ago. Feels "woozy". Dr Julieanne Manson called and aware of this and also patient has EF of 35%. Orders received for NS 500cc fluid bolus and then if SBP comes up to 110 or better to go ahead and give 2 nitro. If not reschedule with Ivabradine instead of Metoprolol.

## 2022-07-28 NOTE — Telephone Encounter (Signed)
Calling patient to reschedule his CCTA appointment and sending ivabradine to his pharmacy.   Left message for patient to call back.

## 2022-07-28 NOTE — Progress Notes (Signed)
SBP checked twice after NS 500cc bolus and remained at 99. Patient discharged in wheelchair. Feels better after bolus and feels fine standing up. Patient and wife aware his test will be rescheduled using Ivabradine instead of Metoprolol to hopefully prevent the lowering of his BP. Merle RN (CT heart navigator) notified to reschedule using Ivabradine instead of Metoprolol.

## 2022-07-28 NOTE — Telephone Encounter (Signed)
Patient calling to reschedule his CCTA appointment. New appointment made for April 4 at [facility].  He is aware he will be taking a different medication for the test.  Patient states he does not need a phone call before his next appointment.  Gordy Clement RN Navigator Cardiac Imaging [facility] [phone] office [phone] cell

## 2022-08-02 ENCOUNTER — Encounter: Payer: Self-pay | Admitting: Infectious Diseases

## 2022-08-02 ENCOUNTER — Other Ambulatory Visit: Payer: Self-pay

## 2022-08-02 ENCOUNTER — Ambulatory Visit (INDEPENDENT_AMBULATORY_CARE_PROVIDER_SITE_OTHER): Payer: Medicare Other | Admitting: Infectious Diseases

## 2022-08-02 VITALS — BP 123/72 | HR 88 | Temp 98.1°F | Resp 16 | Ht 73.0 in | Wt 216.0 lb

## 2022-08-02 DIAGNOSIS — Z79899 Other long term (current) drug therapy: Secondary | ICD-10-CM | POA: Diagnosis not present

## 2022-08-02 DIAGNOSIS — R590 Localized enlarged lymph nodes: Secondary | ICD-10-CM | POA: Diagnosis not present

## 2022-08-02 DIAGNOSIS — R7881 Bacteremia: Secondary | ICD-10-CM | POA: Diagnosis not present

## 2022-08-02 DIAGNOSIS — B9561 Methicillin susceptible Staphylococcus aureus infection as the cause of diseases classified elsewhere: Secondary | ICD-10-CM

## 2022-08-02 DIAGNOSIS — N3001 Acute cystitis with hematuria: Secondary | ICD-10-CM

## 2022-08-02 NOTE — Progress Notes (Signed)
Patient Active Problem List   Diagnosis Date Noted   PICC (peripherally inserted central catheter) removal 07/19/2022   Medication management 07/19/2022   Hypothyroidism [id number]   Chronic systolic CHF (congestive heart failure) 07/06/2022   AKI (acute kidney injury) 07/06/2022   Acute blood loss anemia 07/06/2022   Mesenteric lymphadenopathy 07/06/2022   MSSA bacteremia 07/03/2022   Benign prostatic hyperplasia 07/03/2022   UTI (urinary tract infection) 07/02/2022   Gross hematuria 07/02/2022    Patient's Medications  New Prescriptions   No medications on file  Previous Medications   CARVEDILOL (COREG) 6.25 MG TABLET    Take 1 tablet (6.25 mg total) by mouth 2 (two) times daily with a meal.   FINASTERIDE (PROSCAR) 5 MG TABLET    Take 5 mg by mouth daily.   IVABRADINE (CORLANOR) 7.5 MG TABS TABLET    Take tablets (15mg ) TWO hours prior to your cardiac CT scan.   LEVOTHYROXINE (SYNTHROID) 112 MCG TABLET    Take 150 mcg by mouth daily.   LINEZOLID (ZYVOX) 600 MG TABLET    Take 1 tablet (600 mg total) by mouth 2 (two) times daily for 14 days. Start on 07/19/22 after completion of IV antibiotics   LOSARTAN (COZAAR) 25 MG TABLET    Take 1 tablet (25 mg total) by mouth daily.   METOPROLOL TARTRATE (LOPRESSOR) 100 MG TABLET    Take tablet by mouth 2 hours before CTA   OXYBUTYNIN (DITROPAN-XL) 5 MG 24 HR TABLET    Take 5 mg by mouth daily.   SPIRONOLACTONE (ALDACTONE) 25 MG TABLET    Take 0.5 tablets (12.5 mg total) by mouth daily.   TAMSULOSIN (FLOMAX) 0.4 MG CAPS CAPSULE    Take 0.8 mg by mouth daily.  Modified Medications   No medications on file  Discontinued Medications   No medications on file    Subjective: 77 year old male with PMH as below including BPH, Hypothyroidism as well as recent episode of urinary retention requiring Foley catheter placement with also concern for rectal neoplasm versus locally invasive prostate neoplasm undergoing malignancy work up who is here  for HFU for complicated MSSA bacteremia/UTI in the setting of urologic instrumentation. Was discharged on 3/10 with planned iV cefazolin until 3/18 to be followed by linezolid for 2 weeks thereafter  PICC line removed 3/18, Started taking linezolid since 3/19 which he has been taking without any concerns. Last dose of Linezolid yesterday. He is closely following with Urology. He has an appt for colonoscopy tomorrow. He is somewhat frustrated today regarding difficulty he had with his overall care in terms of his urologic issues as well as scheduling colonoscopy. No complaints otherwise.   Review of Systems: all systems reviewed with pertinent positives and negatives as listed above   Past Medical History:  Diagnosis Date   BPH (benign prostatic hyperplasia)    Chronic HFrEF (heart failure with reduced ejection fraction)    Hypothyroidism    Thyroid disease    Past Surgical History:  Procedure Laterality Date   CYSTOSCOPY WITH FULGERATION N/A 07/05/2022   Procedure: CYSTOSCOPY WITH FULGERATION, CLOT EVACUATION;  Surgeon: Pamala Hurry, MD;  Location: [facility]S;  Service: Urology;  Laterality: N/A;   TEE WITHOUT CARDIOVERSION N/A 07/07/2022   Procedure: TRANSESOPHAGEAL ECHOCARDIOGRAM (TEE);  Surgeon: Buford Dresser, MD;  Location: [facility] ENDOSCOPY;  Service: Cardiovascular;  Laterality: N/A;    Social History   Tobacco Use   Smoking status: Never    Passive exposure: Never   Smokeless  tobacco: Never  Substance Use Topics   Alcohol use: Yes    Alcohol/week: 7.0 standard drinks of alcohol    Types: 7 Glasses of wine per week   Drug use: Never    Family History  Family history unknown: Yes    No Known Allergies  Health Maintenance  Topic Date Due   Medicare Annual Wellness (AWV)  Never done   Hepatitis C Screening  Never done   DTaP/Tdap/Td (1 - Tdap) Never done   Zoster Vaccines- Shingrix (1 of 2) Never done   COLONOSCOPY (Pts 45-86yrs Insurance coverage will need to be  confirmed)  Never done   Pneumonia Vaccine 67+ Years old (1 of 1 - PCV) Never done   COVID-19 Vaccine (2 - Pfizer risk series) 08/22/2019   INFLUENZA VACCINE  12/01/2022   HPV VACCINES  Aged Out    Objective: BP 123/72   Pulse 88   Temp 98.1 F (36.7 C) (Temporal)   Resp 16   Ht 6\' 1"  (1.854 m)   Wt 216 lb (98 kg)   SpO2 98%   BMI 28.50 kg/m   Physical Exam Constitutional:      Appearance: Normal appearance.  HENT:     Head: Normocephalic and atraumatic.      Mouth: Mucous membranes are moist.  Eyes:    Conjunctiva/sclera: Conjunctivae normal.     Pupils: Pupils are equal, round, and bilaterally symmetrical   Cardiovascular:     Rate and Rhythm: Normal rate and regular rhythm.     Heart sounds: s1s2   Pulmonary:     Effort: Pulmonary effort is normal.     Breath sounds: Normal breath sounds.   Abdominal:     General: Non distended     Palpations: soft. Foleys with a urobag  Musculoskeletal:        General: Normal range of motion.   Skin:    General: Skin is warm and dry.     Comments:  Neurological:     General: grossly non focal     Mental Status: awake, alert and oriented to person, place, and time.   Psychiatric:        Mood and Affect: Mood normal.   Lab Results Lab Results  Component Value Date   WBC 6.9 07/19/2022   HGB 9.3 (L) 07/19/2022   HCT 28.9 (L) 07/19/2022   MCV 93.8 07/19/2022   PLT 198 07/19/2022    Lab Results  Component Value Date   CREATININE 0.96 07/19/2022   BUN 13 07/19/2022   NA 138 07/19/2022   K 4.8 07/19/2022   CL 100 07/19/2022   CO2 28 07/19/2022    Lab Results  Component Value Date   ALT 8 07/07/2022   AST 19 07/07/2022   ALKPHOS 35 (L) 07/07/2022   BILITOT 0.6 07/07/2022    Lab Results  Component Value Date   CHOL 85 07/06/2022   HDL 18 (L) 07/06/2022   LDLCALC 55 07/06/2022   TRIG 59 07/06/2022   CHOLHDL 4.7 07/06/2022   No results found for: "LABRPR", "RPRTITER" No results found for:  "HIV1RNAQUANT", "HIV1RNAVL", "CD4TABS"  Assessment/Plan  # MSSA bacteremia likely 2/2 MSSA UTI in the setting of urologoc instrumentation - Repeat blood cx 3/4  - 3/4 TTE negative for vegetations - 4/7 TEE negative for vegetations or endocarditis  - completed 2 weeks of IV cefazolin 3/18 > linezolid for 2 weeks from 3/19 - Fu as needed with [place]    # Gross hematuria  #  Prostate lesion on MRI 06/2022/elevated PSA # BPH - 3/5 Cystourethroscopy, Clot Evacuation with Fulguration  - Still has Foley's after failing voiding trial  - Had prostate biopsy last week, results pending, follows urology    # Mesenteric lymphadenopathy  - he has an appt for scheduling colonoscopy tomorrow.    I have personally spent 42 minutes involved in face-to-face and non-face-to-face activities for this patient on the day of the visit. Professional time spent includes the following activities: Preparing to see the patient (review of tests), Obtaining and/or reviewing separately obtained history (admission/discharge record), Performing a medically appropriate examination and/or evaluation , Ordering medications/tests/procedures, referring and communicating with other health care professionals, Documenting clinical information in the EMR, Independently interpreting results (not separately reported), Communicating results to the patient/family/caregiver, Counseling and educating the patient/family/caregiver and Care coordination (not separately reported).   Wilber Oliphant, [place] [facility]ious [facility] 08/02/2022, 10:36 AM

## 2022-08-03 ENCOUNTER — Encounter: Payer: Self-pay | Admitting: Urology

## 2022-08-03 ENCOUNTER — Other Ambulatory Visit (HOSPITAL_COMMUNITY): Payer: Self-pay | Admitting: *Deleted

## 2022-08-03 ENCOUNTER — Other Ambulatory Visit (HOSPITAL_COMMUNITY): Payer: Self-pay

## 2022-08-03 DIAGNOSIS — N400 Enlarged prostate without lower urinary tract symptoms: Secondary | ICD-10-CM | POA: Diagnosis not present

## 2022-08-03 DIAGNOSIS — B9561 Methicillin susceptible Staphylococcus aureus infection as the cause of diseases classified elsewhere: Secondary | ICD-10-CM | POA: Diagnosis not present

## 2022-08-03 DIAGNOSIS — R030 Elevated blood-pressure reading, without diagnosis of hypertension: Secondary | ICD-10-CM | POA: Diagnosis not present

## 2022-08-03 DIAGNOSIS — R0609 Other forms of dyspnea: Secondary | ICD-10-CM | POA: Diagnosis not present

## 2022-08-03 DIAGNOSIS — I509 Heart failure, unspecified: Secondary | ICD-10-CM | POA: Diagnosis not present

## 2022-08-03 DIAGNOSIS — I5022 Chronic systolic (congestive) heart failure: Secondary | ICD-10-CM | POA: Diagnosis not present

## 2022-08-03 DIAGNOSIS — D696 Thrombocytopenia, unspecified: Secondary | ICD-10-CM | POA: Diagnosis not present

## 2022-08-03 DIAGNOSIS — R590 Localized enlarged lymph nodes: Secondary | ICD-10-CM | POA: Diagnosis not present

## 2022-08-03 DIAGNOSIS — R972 Elevated prostate specific antigen [PSA]: Secondary | ICD-10-CM | POA: Diagnosis not present

## 2022-08-03 DIAGNOSIS — D649 Anemia, unspecified: Secondary | ICD-10-CM | POA: Diagnosis not present

## 2022-08-03 DIAGNOSIS — D61811 Other drug-induced pancytopenia: Secondary | ICD-10-CM | POA: Diagnosis not present

## 2022-08-03 MED ORDER — IVABRADINE HCL 7.5 MG PO TABS
ORAL_TABLET | ORAL | 0 refills | Status: DC
  Filled 2022-08-03: qty 2, 1d supply, fill #0

## 2022-08-04 ENCOUNTER — Ambulatory Visit (HOSPITAL_COMMUNITY)
Admission: RE | Admit: 2022-08-04 | Discharge: 2022-08-04 | Disposition: A | Discharge status: Home / Self Care | Payer: Medicare Other | Source: Ambulatory Visit | Attending: Student | Admitting: Student

## 2022-08-04 DIAGNOSIS — I5022 Chronic systolic (congestive) heart failure: Secondary | ICD-10-CM | POA: Diagnosis not present

## 2022-08-04 MED ORDER — METOPROLOL TARTRATE 5 MG/5ML IV SOLN
INTRAVENOUS | Status: AC
  Filled 2022-08-04: qty 10

## 2022-08-04 MED ORDER — NITROGLYCERIN 0.4 MG SL SUBL
0.8000 mg | SUBLINGUAL_TABLET | Freq: Once | SUBLINGUAL | Status: AC
  Administered 2022-08-04: 0.8 mg via SUBLINGUAL

## 2022-08-04 MED ORDER — NITROGLYCERIN 0.4 MG SL SUBL
SUBLINGUAL_TABLET | SUBLINGUAL | Status: AC
  Filled 2022-08-04: qty 2

## 2022-08-04 MED ORDER — IOHEXOL 350 MG/ML SOLN
100.0000 mL | Freq: Once | INTRAVENOUS | Status: AC | PRN
  Administered 2022-08-04: 100 mL via INTRAVENOUS

## 2022-08-04 MED ORDER — METOPROLOL TARTRATE 5 MG/5ML IV SOLN
10.0000 mg | INTRAVENOUS | Status: DC | PRN
  Administered 2022-08-04: 10 mg via INTRAVENOUS

## 2022-08-04 NOTE — Progress Notes (Signed)
Patient states, "That's closer to what it normally is." Patient asymptomatic.

## 2022-08-05 DIAGNOSIS — D696 Thrombocytopenia, unspecified: Secondary | ICD-10-CM | POA: Diagnosis not present

## 2022-08-05 DIAGNOSIS — D62 Acute posthemorrhagic anemia: Secondary | ICD-10-CM | POA: Diagnosis not present

## 2022-08-05 DIAGNOSIS — D61818 Other pancytopenia: Secondary | ICD-10-CM | POA: Diagnosis not present

## 2022-08-05 DIAGNOSIS — B9561 Methicillin susceptible Staphylococcus aureus infection as the cause of diseases classified elsewhere: Secondary | ICD-10-CM | POA: Diagnosis not present

## 2022-08-08 DIAGNOSIS — R310 Gross hematuria: Secondary | ICD-10-CM | POA: Diagnosis not present

## 2022-08-08 DIAGNOSIS — R338 Other retention of urine: Secondary | ICD-10-CM | POA: Diagnosis not present

## 2022-08-08 DIAGNOSIS — R9389 Abnormal findings on diagnostic imaging of other specified body structures: Secondary | ICD-10-CM | POA: Diagnosis not present

## 2022-08-08 DIAGNOSIS — D61818 Other pancytopenia: Secondary | ICD-10-CM | POA: Diagnosis not present

## 2022-08-08 DIAGNOSIS — N400 Enlarged prostate without lower urinary tract symptoms: Secondary | ICD-10-CM | POA: Diagnosis not present

## 2022-08-08 DIAGNOSIS — D696 Thrombocytopenia, unspecified: Secondary | ICD-10-CM | POA: Diagnosis not present

## 2022-08-08 DIAGNOSIS — R972 Elevated prostate specific antigen [PSA]: Secondary | ICD-10-CM | POA: Diagnosis not present

## 2022-08-08 DIAGNOSIS — D62 Acute posthemorrhagic anemia: Secondary | ICD-10-CM | POA: Diagnosis not present

## 2022-08-09 ENCOUNTER — Other Ambulatory Visit (HOSPITAL_COMMUNITY): Payer: Self-pay | Admitting: *Deleted

## 2022-08-09 DIAGNOSIS — D649 Anemia, unspecified: Secondary | ICD-10-CM

## 2022-08-10 ENCOUNTER — Ambulatory Visit (HOSPITAL_COMMUNITY)
Admission: RE | Admit: 2022-08-10 | Discharge: 2022-08-10 | Disposition: A | Discharge status: Home / Self Care | Payer: Medicare Other | Source: Ambulatory Visit | Attending: Internal Medicine | Admitting: Internal Medicine

## 2022-08-10 DIAGNOSIS — D649 Anemia, unspecified: Secondary | ICD-10-CM | POA: Insufficient documentation

## 2022-08-10 LAB — PREPARE RBC (CROSSMATCH)

## 2022-08-10 MED ORDER — SODIUM CHLORIDE 0.9% IV SOLUTION: Freq: Once | INTRAVENOUS | Status: DC

## 2022-08-11 ENCOUNTER — Telehealth: Payer: Self-pay | Admitting: Urology

## 2022-08-11 ENCOUNTER — Telehealth: Payer: Self-pay | Admitting: Student

## 2022-08-11 LAB — TYPE AND SCREEN
ABO/RH(D): A POS
Antibody Screen: NEGATIVE
Unit division: 0

## 2022-08-11 LAB — BPAM RBC
Blood Product Expiration Date: 202405012359
ISSUE DATE / TIME: 202404100929
Unit Type and Rh: 6200

## 2022-08-11 MED ORDER — ROSUVASTATIN CALCIUM 5 MG PO TABS: 5.0000 mg | ORAL_TABLET | Freq: Every day | ORAL | 3 refills | Status: DC

## 2022-08-11 NOTE — Telephone Encounter (Signed)
Pt c/o medication issue:  1. Name of Medication:   carvedilol (COREG) 6.25 MG tablet   2. How are you currently taking this medication (dosage and times per day)? As prescribed  3. Are you having a reaction (difficulty breathing--STAT)?   SOB  4. What is your medication issue?   Wife stated patient is having SOB and wants to discuss medication and next steps.

## 2022-08-11 NOTE — Telephone Encounter (Signed)
Patient wife called and wanted to discuss all patient issues  from SOB to needing blood, Prostate issues,  to change in Echo.  Once rerouted to main concern she states it is his SOB.  She states this has been going on for quite some time as evident at appt on 3/21.  She states that it has not gotten worse but is not getting any better.  As discussing she states that he saw hematology/ oncology yesterday and received 1 Unit of PRBS's due to low values,  He will have labs re-drawn on Monday to see if further transfusion is necessary. He received result that he does not have Prostate CA, She states he has multiple issues but she  ask if the Coreg could be the reason for his continued SOB. No BP or HR readings, no other symptoms.  Has not started Crestor as "he has too many other things going on health wise to worry about that right now".  I advised that the low H&H could be the cause of his SOB as some f other issues but she wants to see if the Coreg could be the reason.  Advised I would send this to the provider to review.   Crestor sent to pharmacy for when she is ready to pick up for patient.

## 2022-08-11 NOTE — Telephone Encounter (Signed)
Patient's wife was also told Dr Margo Aye is out of the office for two weeks and that I would route this to his nurse as well.

## 2022-08-11 NOTE — Telephone Encounter (Signed)
Patient's wife called in regards to patient wanted to relay a message to Dr Margo Aye. Patient's wife stated patient said his biopsy came negative, no cancer, and patient wanted to talk to Dr Margo Aye about treatment options. Patient stated that Dr Margo Aye mentioned someone to him who does laser treatment but they did not recall the name of the provider.   Patients callback #: [phone]

## 2022-08-13 NOTE — Telephone Encounter (Signed)
I don't think the Coreg is the cause of his shortness of breath. I agree that it may be due to his anemia, especially if his hemoglobin has been so low that he is needing blood transfusions. At last visit, it also sounded like deconditioning was playing a role in his shortness of breath. His recent coronary CTA showed only mild soft plaque but no obstructive CAD which is good. We are planning on repeating an Echo in about 2 months to reassess his LV function. We can check a BNP (a lab that helps assess fluid/ volume status) if patient/ wife wants but I really suspect his shortness of breath is non-cardiac right now. He should weigh himself daily though and let us know if he is gaining weight quickly (3lbs in 1 day or 5 lbs in 1 week).  Thank you!

## 2022-08-15 DIAGNOSIS — D61818 Other pancytopenia: Secondary | ICD-10-CM | POA: Diagnosis not present

## 2022-08-15 DIAGNOSIS — D696 Thrombocytopenia, unspecified: Secondary | ICD-10-CM | POA: Diagnosis not present

## 2022-08-15 DIAGNOSIS — D62 Acute posthemorrhagic anemia: Secondary | ICD-10-CM | POA: Diagnosis not present

## 2022-08-15 DIAGNOSIS — D649 Anemia, unspecified: Secondary | ICD-10-CM | POA: Diagnosis not present

## 2022-08-16 ENCOUNTER — Ambulatory Visit: Payer: Medicare Other | Admitting: Gastroenterology

## 2022-08-16 NOTE — Telephone Encounter (Signed)
Left message to call back. ECHO is already ordered.

## 2022-08-17 DIAGNOSIS — N401 Enlarged prostate with lower urinary tract symptoms: Secondary | ICD-10-CM | POA: Diagnosis not present

## 2022-08-17 DIAGNOSIS — R338 Other retention of urine: Secondary | ICD-10-CM | POA: Diagnosis not present

## 2022-08-18 NOTE — Telephone Encounter (Signed)
Returned call to pt, weight is doing well so far. Gave phone to wife. They will call if weight >3# or >5#. Wife states that she will start taking BP @ hm. Stopped Carvedilol (no further SOB) and spiro. She states that the saw URO-they want to do a aqua-ablation procedure on Prostate(?-prostate artery embolectomy-per wife). If prostates does not shrink they will probably do ablation. Still bleeding from prostate, biopsy was negative. pt saw interventionist nothing new there. Saw Hematologist they do not see a cause for bleeding and suggested bone marrow transplant, pt/wife will not commit to this until they have awhile to think about this. She states that pt has had "too much going on lately to decide right now".

## 2022-08-24 ENCOUNTER — Other Ambulatory Visit (HOSPITAL_COMMUNITY): Payer: Medicare Other

## 2022-08-29 DIAGNOSIS — N401 Enlarged prostate with lower urinary tract symptoms: Secondary | ICD-10-CM | POA: Diagnosis not present

## 2022-09-08 DIAGNOSIS — N401 Enlarged prostate with lower urinary tract symptoms: Secondary | ICD-10-CM | POA: Diagnosis not present

## 2022-09-08 DIAGNOSIS — I771 Stricture of artery: Secondary | ICD-10-CM | POA: Diagnosis not present

## 2022-09-08 DIAGNOSIS — R310 Gross hematuria: Secondary | ICD-10-CM | POA: Diagnosis not present

## 2022-09-12 ENCOUNTER — Other Ambulatory Visit (HOSPITAL_COMMUNITY): Payer: Self-pay

## 2022-09-19 DIAGNOSIS — R338 Other retention of urine: Secondary | ICD-10-CM | POA: Diagnosis not present

## 2022-09-19 DIAGNOSIS — R310 Gross hematuria: Secondary | ICD-10-CM | POA: Diagnosis not present

## 2022-09-19 DIAGNOSIS — N401 Enlarged prostate with lower urinary tract symptoms: Secondary | ICD-10-CM | POA: Diagnosis not present

## 2022-09-28 DIAGNOSIS — N3000 Acute cystitis without hematuria: Secondary | ICD-10-CM | POA: Diagnosis not present

## 2022-09-28 DIAGNOSIS — D61818 Other pancytopenia: Secondary | ICD-10-CM | POA: Diagnosis not present

## 2022-10-18 DIAGNOSIS — R310 Gross hematuria: Secondary | ICD-10-CM | POA: Diagnosis not present

## 2022-10-18 LAB — URINALYSIS, ROUTINE W REFLEX MICROSCOPIC
Bilirubin, UA: NEGATIVE
Glucose, UA: NEGATIVE
Leukocytes,UA: NEGATIVE
Nitrite, UA: NEGATIVE
RBC, UA: NEGATIVE
Specific Gravity, UA: 1.024 (ref 1.005–1.030)
Urobilinogen, Ur: 0.2 mg/dL (ref 0.2–1.0)
pH, UA: 5.5 (ref 5.0–7.5)

## 2022-10-18 LAB — MICROSCOPIC EXAMINATION
Bacteria, UA: NONE SEEN
Casts: NONE SEEN /lpf
Epithelial Cells (non renal): NONE SEEN /hpf (ref 0–10)
RBC, Urine: NONE SEEN /hpf (ref 0–2)

## 2022-10-20 ENCOUNTER — Ambulatory Visit (HOSPITAL_COMMUNITY): Payer: Medicare Other | Attending: Student

## 2022-10-20 DIAGNOSIS — I5022 Chronic systolic (congestive) heart failure: Secondary | ICD-10-CM

## 2022-10-20 LAB — ECHOCARDIOGRAM COMPLETE
Area-P 1/2: 3.75 cm2
P 1/2 time: 403 msec
S' Lateral: 3.8 cm

## 2022-10-20 MED ORDER — PERFLUTREN LIPID MICROSPHERE
1.0000 mL | INTRAVENOUS | Status: AC | PRN
  Administered 2022-10-20: 3 mL via INTRAVENOUS

## 2022-10-24 ENCOUNTER — Telehealth: Payer: Self-pay | Admitting: Cardiology

## 2022-10-24 ENCOUNTER — Other Ambulatory Visit: Payer: Self-pay

## 2022-10-24 DIAGNOSIS — I5022 Chronic systolic (congestive) heart failure: Secondary | ICD-10-CM

## 2022-10-24 MED ORDER — ENTRESTO 24-26 MG PO TABS: 1.0000 | ORAL_TABLET | Freq: Two times a day (BID) | ORAL | 0 refills | Status: DC

## 2022-10-24 MED ORDER — ENTRESTO 24-26 MG PO TABS: 1.0000 | ORAL_TABLET | Freq: Two times a day (BID) | ORAL | 3 refills | Status: DC

## 2022-10-24 NOTE — Addendum Note (Signed)
Addended by: Bernita Buffy on: 10/24/2022 04:57 PM   Modules accepted: Orders

## 2022-10-24 NOTE — Telephone Encounter (Signed)
Pt came in to get cheaper samples and ask about BMET location.   Steven Carter, 10/24/2022

## 2022-10-24 NOTE — Telephone Encounter (Signed)
Pt's wife walked into clinic today to ask about samples of entresto 24-26mg . They have spent a great deal of money on other prescriptions that pt has not been able to tolerate. 2 weeks of samples provided.

## 2022-11-04 ENCOUNTER — Ambulatory Visit: Payer: Medicare Other | Admitting: Cardiology

## 2022-11-04 ENCOUNTER — Encounter: Payer: Self-pay | Admitting: Cardiology

## 2022-11-04 VITALS — BP 151/84 | HR 96 | Ht 73.0 in | Wt 211.0 lb

## 2022-11-04 DIAGNOSIS — D500 Iron deficiency anemia secondary to blood loss (chronic): Secondary | ICD-10-CM

## 2022-11-04 DIAGNOSIS — I5022 Chronic systolic (congestive) heart failure: Secondary | ICD-10-CM | POA: Diagnosis not present

## 2022-11-04 DIAGNOSIS — D509 Iron deficiency anemia, unspecified: Secondary | ICD-10-CM | POA: Diagnosis not present

## 2022-11-04 DIAGNOSIS — I420 Dilated cardiomyopathy: Secondary | ICD-10-CM | POA: Diagnosis not present

## 2022-11-04 MED ORDER — SPIRONOLACTONE 25 MG PO TABS: 25.0000 mg | ORAL_TABLET | Freq: Every day | ORAL | 2 refills | Status: DC

## 2022-11-04 MED ORDER — ENTRESTO 24-26 MG PO TABS: 1.0000 | ORAL_TABLET | Freq: Two times a day (BID) | ORAL | 2 refills | Status: DC

## 2022-11-04 MED ORDER — METOPROLOL SUCCINATE ER 25 MG PO TB24: 25.0000 mg | ORAL_TABLET | Freq: Every day | ORAL | 2 refills | Status: DC

## 2022-11-04 NOTE — Progress Notes (Signed)
Patient referred by Alysia Penna, MD for HFrEF  Subjective:   Steven Carter, male    DOB: [birth year], 77 y.o.   MRN: [id number]   Chief Complaint  Patient presents with   heart failure with reduced ejection fraction   New Patient (Initial Visit)    HPI  77 y.o. 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  Patient has had complex history of BPH, gross hematuria, UTI and MSSA bacteremia, around the time of treatment for BPH, and subsequent finding dilated LV and EF 35%.Subsequently, coronary CT angiogram ruled out CAD as cause of his cardiomyopathy, showing mild nonobstructive coronary artery disease.  Patient was initially started on Entresto, carvedilol, spirononolactone during hospitalization in 07/2022, all were stopped due low blood pressure or fatigue. He was subsequently started on losartan and switched to Entresto 24-26 mg bid samples 2 weeks ago following finding of persistently low LVEF.  Patient continues to have exertional dyspnea with climbing up flights, denies orthopnea, PND, leg edema. He has had chronic chest pain for 40 years. He still has low energy levels. He has also had anemia that was thought to be due to gross hematuria, and and thrombocytopenia thought to be due to bone marrow suppression due to Zynox used to treat his UTI/bacteremia. He has not had any follow up CBC.    Past Medical History:  Diagnosis Date   BPH (benign prostatic hyperplasia)    Chronic HFrEF (heart failure with reduced ejection fraction) (HCC)    Hypothyroidism    Thyroid disease      Past Surgical History:  Procedure Laterality Date   CYSTOSCOPY WITH FULGERATION N/A 07/05/2022   Procedure: CYSTOSCOPY WITH FULGERATION, CLOT EVACUATION;  Surgeon: Joline Maxcy,  MD;  Location: [facility];  Service: Urology;  Laterality: N/A;   TEE WITHOUT CARDIOVERSION N/A 07/07/2022   Procedure: TRANSESOPHAGEAL ECHOCARDIOGRAM (TEE);  Surgeon: Jodelle Red, MD;  Location: [facility] ENDOSCOPY;  Service: Cardiovascular;  Laterality: N/A;     Social History   Tobacco Use  Smoking Status Never   Passive exposure: Never  Smokeless Tobacco Never    Social History   Substance and Sexual Activity  Alcohol Use Yes   Alcohol/week: 7.0 standard drinks of alcohol   Types: 7 Glasses of wine per week     Family History  Family history unknown: Yes      Current Outpatient Medications:    famotidine (PEPCID) 20 MG tablet, Take 20 mg by mouth daily., Disp: , Rfl:    naproxen (NAPROSYN) 250 MG tablet, Take 250 mg by mouth 2 (two) times daily., Disp: , Rfl:    finasteride (PROSCAR) 5 MG tablet, Take 5 mg by mouth daily., Disp: , Rfl:    levothyroxine (SYNTHROID) 112 MCG tablet, Take 150 mcg by mouth daily., Disp: , Rfl:    sacubitril-valsartan (ENTRESTO) 24-26 MG, Take 1 tablet by mouth 2 (two) times daily., Disp: 28 tablet, Rfl: 0   spironolactone (ALDACTONE) 25 MG tablet, Take 0.5 tablets (12.5 mg total) by mouth daily., Disp: 30 tablet, Rfl: 3   SYNTHROID 150 MCG tablet, Take 150 mcg by mouth every morning., Disp: , Rfl:    tamsulosin (FLOMAX) 0.4 MG CAPS capsule, Take 0.8 mg by mouth daily., Disp: , Rfl:    Cardiovascular and other pertinent studies:  Reviewed  external labs and tests, independently interpreted  EKG 11/04/2022: Sinus rhythm 84 bpm  Normal EKG  Echocardiogram 10/20/2022: 1. Left ventricular ejection fraction, by estimation, is 35 to 40%. The  left ventricle has moderately decreased function. The left ventricle  demonstrates global hypokinesis. The left ventricular internal cavity size was mildly to moderately dilated.  Left ventricular diastolic parameters are indeterminate.   2. Right ventricular systolic function is normal. The right  ventricular  size is mildly enlarged. There is normal pulmonary artery systolic  pressure.   3. The mitral valve is normal in structure. Trivial mitral valve  regurgitation. No evidence of mitral stenosis.   4. The aortic valve is tricuspid. There is mild calcification of the  aortic valve. There is mild thickening of the aortic valve. Aortic valve  regurgitation is mild. Aortic valve sclerosis is present, with no evidence  of aortic valve stenosis.   5. Aortic dilatation noted. There is mild dilatation of the ascending  aorta, measuring 41 mm.   6. The inferior vena cava is normal in size with greater than 50%  respiratory variability, suggesting right atrial pressure of 3 mmHg.   Comparison(s): Prior images reviewed side by side.   Coronary CTA 08/04/2022: 1. Calcium score is 0 2.  Dilated ascending thoracic aorta 4.0 cm 3.  CAD RADS 1 non obstructive soft plaque (RCA, LCX)   Recent labs: 07/19/2022: Glucose 96, BUN/Cr 13/0.96. EGFR >60. Na/K 138/4.8. AlKP 36. Albumin 3.3. Rest of the CMP normal BNP 49 H/H 9/29. MCV 93. Platelets 198 HbA1C 5.7% Chol 85, TG 59, HDL 18, LDL 55 TSH NA    Review of Systems  Constitutional: Positive for malaise/fatigue.  Cardiovascular:  Positive for dyspnea on exertion. Negative for chest pain, leg swelling, palpitations and syncope.         Vitals:   11/04/22 0931 11/04/22 0944  BP: (!) 143/91 (!) 151/84  Pulse: 98 96  SpO2: 96% 97%     Body mass index is 27.84 kg/m. Filed Weights   11/04/22 0931  Weight: 211 lb (95.7 kg)     Objective:   Physical Exam Vitals and nursing note reviewed.  Constitutional:      General: He is not in acute distress. Neck:     Vascular: No JVD.  Cardiovascular:     Rate and Rhythm: Normal rate and regular rhythm.     Heart sounds: Normal heart sounds. No murmur heard. Pulmonary:     Effort: Pulmonary effort is normal.     Breath sounds: Normal breath sounds. No wheezing or rales.   Musculoskeletal:     Right lower leg: No edema.     Left lower leg: No edema.          Visit diagnoses:   ICD-10-CM   1. Chronic HFrEF (heart failure with reduced ejection fraction) (HCC)  I50.22 EKG 12-Lead    CBC    Pro b natriuretic peptide (BNP)[facility])    Comprehensive Metabolic Panel (CMET)    CANCELED: Basic metabolic panel    2. Dilated cardiomyopathy (HCC)  I42.0     3. Blood loss anemia  D50.0        Orders Placed This Encounter  Procedures   CBC   Pro b natriuretic peptide (BNP)[facility])   Comprehensive Metabolic Panel (CMET)   EKG 12-Lead      Meds ordered this encounter  Medications   sacubitril-valsartan (ENTRESTO) 24-26 MG    Sig: Take 1 tablet by  mouth 2 (two) times daily.    Dispense:  60 tablet    Refill:  2   spironolactone (ALDACTONE) 25 MG tablet    Sig: Take 1 tablet (25 mg total) by mouth daily.    Dispense:  30 tablet    Refill:  2   metoprolol succinate (TOPROL-XL) 25 MG 24 hr tablet    Sig: Take 1 tablet (25 mg total) by mouth daily. Take with or immediately following a meal.    Dispense:  30 tablet    Refill:  2     Assessment & Recommendations:   77 y.o. 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 Appears euvolemic and compensated.  Currently on Entresto 24-26 mg bid. Added metoprolol succinate 25 mg daily, spironolactone 25 mg daily. Check BMP, proBNP in 1 week.  Given his h/o UTI, will avoid SGLT2i.   Anemia: Thought to be due to gross hematuria. Will check CBC.  Non-bobstructive CAD: Soft non calcified soft plaque <25% in Lcx and RCA. Will discuss statin at the next visit.   UTI: Resolved. Continue follow up with urology, now seeing urologist in [place].  Thank you for referring the patient to [place]. Please  feel free to contact with any questions.   Elder Negus, MD Pager: [phone] Office: [phone]

## 2022-11-07 DIAGNOSIS — I5022 Chronic systolic (congestive) heart failure: Secondary | ICD-10-CM | POA: Diagnosis not present

## 2022-11-07 DIAGNOSIS — E039 Hypothyroidism, unspecified: Secondary | ICD-10-CM | POA: Diagnosis not present

## 2022-11-09 DIAGNOSIS — D61818 Other pancytopenia: Secondary | ICD-10-CM | POA: Diagnosis not present

## 2022-11-11 DIAGNOSIS — I5022 Chronic systolic (congestive) heart failure: Secondary | ICD-10-CM | POA: Diagnosis not present

## 2022-11-12 LAB — COMPREHENSIVE METABOLIC PANEL
ALT: 13 IU/L (ref 0–44)
AST: 15 IU/L (ref 0–40)
Albumin: 4.5 g/dL (ref 3.8–4.8)
Alkaline Phosphatase: 92 IU/L (ref 44–121)
BUN/Creatinine Ratio: 11 (ref 10–24)
BUN: 11 mg/dL (ref 8–27)
Bilirubin Total: 0.3 mg/dL (ref 0.0–1.2)
CO2: 20 mmol/L (ref 20–29)
Calcium: 9.1 mg/dL (ref 8.6–10.2)
Chloride: 103 mmol/L (ref 96–106)
Creatinine, Ser: 0.98 mg/dL (ref 0.76–1.27)
Globulin, Total: 2.7 g/dL (ref 1.5–4.5)
Glucose: 96 mg/dL (ref 70–99)
Potassium: 4.4 mmol/L (ref 3.5–5.2)
Sodium: 138 mmol/L (ref 134–144)
Total Protein: 7.2 g/dL (ref 6.0–8.5)
eGFR: 80 mL/min/{1.73_m2} (ref >59)

## 2022-11-16 ENCOUNTER — Ambulatory Visit: Payer: Medicare Other | Attending: Cardiology | Admitting: Cardiology

## 2022-11-25 ENCOUNTER — Other Ambulatory Visit: Payer: Self-pay | Admitting: Urology

## 2022-11-25 DIAGNOSIS — D61818 Other pancytopenia: Secondary | ICD-10-CM | POA: Diagnosis not present

## 2022-11-25 DIAGNOSIS — R972 Elevated prostate specific antigen [PSA]: Secondary | ICD-10-CM

## 2022-11-29 DIAGNOSIS — D469 Myelodysplastic syndrome, unspecified: Secondary | ICD-10-CM | POA: Diagnosis not present

## 2022-12-05 DIAGNOSIS — C9310 Chronic myelomonocytic leukemia not having achieved remission: Secondary | ICD-10-CM

## 2022-12-05 HISTORY — DX: Chronic myelomonocytic leukemia not having achieved remission: C93.10

## 2022-12-06 DIAGNOSIS — E039 Hypothyroidism, unspecified: Secondary | ICD-10-CM | POA: Diagnosis not present

## 2022-12-07 ENCOUNTER — Ambulatory Visit: Payer: Medicare Other | Admitting: Cardiology

## 2022-12-12 ENCOUNTER — Ambulatory Visit: Payer: Medicare Other | Admitting: Cardiology

## 2022-12-12 ENCOUNTER — Encounter: Payer: Self-pay | Admitting: Cardiology

## 2022-12-12 VITALS — BP 125/67 | HR 88 | Resp 16 | Ht 73.0 in | Wt 206.8 lb

## 2022-12-12 DIAGNOSIS — I420 Dilated cardiomyopathy: Secondary | ICD-10-CM

## 2022-12-12 DIAGNOSIS — I5022 Chronic systolic (congestive) heart failure: Secondary | ICD-10-CM | POA: Diagnosis not present

## 2022-12-12 MED ORDER — ENTRESTO 49-51 MG PO TABS: 1.0000 | ORAL_TABLET | Freq: Two times a day (BID) | ORAL | 2 refills | Status: DC

## 2022-12-12 NOTE — Progress Notes (Signed)
Patient referred by Alysia Penna, MD for HFrEF  Subjective:   Steven Carter, male    DOB: [birth year], 77 y.o.   MRN: [id number]   Chief Complaint  Patient presents with   Chronic HFrEF (heart failure with reduced ejection fraction)   Follow-up    4 weeks    HPI  77 y.o. 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  Separately from his ongoing HFrEF treatment, he was recently diagnosed with CMML.  He is seeing Dr. Gypsy Lore at [facility] for the same.  Most recent hemoglobin checked on 11/25/2022 is 11.5, better than before.  Initial consultation visit 11/04/2022: Patient was previously seen by heart care cardiologist, now here to establish care with me.  Patient has had complex history of BPH, gross hematuria, UTI and MSSA bacteremia, around the time of treatment for BPH, and subsequent finding dilated LV and EF 35%.Subsequently, coronary CT angiogram ruled out CAD as cause of his cardiomyopathy, showing mild nonobstructive coronary artery disease.  Patient was initially started on Entresto, carvedilol, spirononolactone during hospitalization in 07/2022, all were stopped due low blood pressure or fatigue. He was subsequently started on losartan and switched to Entresto 24-26 mg bid samples 2 weeks ago following finding of persistently low LVEF.  Patient continues to have exertional dyspnea with climbing up flights, denies orthopnea, PND, leg edema. He has had chronic chest pain for 40 years. He still has low energy levels. He has also had anemia that was thought to be due to gross hematuria, and and thrombocytopenia thought to be due to bone marrow suppression due to Zynox used to treat his UTI/bacteremia. He has not had any follow up CBC.     Current Outpatient Medications:     finasteride (PROSCAR) 5 MG tablet, Take 5 mg by mouth daily., Disp: , Rfl:    levothyroxine (SYNTHROID) 150 MCG tablet, Take 150 mcg by mouth daily before breakfast., Disp: , Rfl:    metoprolol succinate (TOPROL-XL) 25 MG 24 hr tablet, Take 1 tablet (25 mg total) by mouth daily. Take with or immediately following a meal., Disp: 30 tablet, Rfl: 2   sacubitril-valsartan (ENTRESTO) 24-26 MG, Take 1 tablet by mouth 2 (two) times daily., Disp: 60 tablet, Rfl: 2   spironolactone (ALDACTONE) 25 MG tablet, Take 1 tablet (25 mg total) by mouth daily., Disp: 30 tablet, Rfl: 2   tamsulosin (FLOMAX) 0.4 MG CAPS capsule, Take 0.8 mg by mouth daily., Disp: , Rfl:    Cardiovascular and other pertinent studies:  Reviewed external labs and tests, independently interpreted  EKG 11/04/2022: Sinus rhythm 84 bpm  Normal EKG  Echocardiogram 10/20/2022: 1. Left ventricular ejection fraction, by estimation, is 35 to 40%. The  left ventricle has moderately decreased function. The left ventricle  demonstrates global hypokinesis. The left ventricular internal cavity size was mildly to moderately dilated.  Left ventricular diastolic parameters are indeterminate.   2. Right ventricular systolic function is normal. The right ventricular  size is mildly enlarged. There is normal pulmonary artery systolic  pressure.   3. The mitral valve is normal in structure. Trivial mitral valve  regurgitation. No evidence of mitral stenosis.   4. The aortic valve is tricuspid. There is mild calcification of the  aortic valve. There is mild thickening of the aortic valve. Aortic valve  regurgitation is  mild. Aortic valve sclerosis is present, with no evidence  of aortic valve stenosis.   5. Aortic dilatation noted. There is mild dilatation of the ascending  aorta, measuring 41 mm.   6. The inferior vena cava is normal in size with greater than 50%  respiratory variability, suggesting right atrial pressure of 3 mmHg.    Comparison(s): Prior images reviewed side by side.   Coronary CTA 08/04/2022: 1. Calcium score is 0 2.  Dilated ascending thoracic aorta 4.0 cm 3.  CAD RADS 1 non obstructive soft plaque (RCA, LCX)   Recent labs: 11/25/2022: H/H 11.5/33.9. MCV 88. Platelets 133  11/11/2022: Glucose 96, BUN/Cr 11/0.98. EGFR 80. Na/K 138/4.4. Rest of the CMP normal  07/19/2022: Glucose 96, BUN/Cr 13/0.96. EGFR >60. Na/K 138/4.8. AlKP 36. Albumin 3.3. Rest of the CMP normal BNP 49 H/H 9/29. MCV 93. Platelets 198 HbA1C 5.7% Chol 85, TG 59, HDL 18, LDL 55 TSH NA    Review of Systems  Constitutional: Negative for malaise/fatigue.  Cardiovascular:  Positive for dyspnea on exertion. Negative for chest pain, leg swelling, palpitations and syncope.         Vitals:   12/12/22 1423  BP: 125/67  Pulse: 88  Resp: 16  SpO2: 98%      Body mass index is 27.28 kg/m. Filed Weights   12/12/22 1423  Weight: 206 lb 12.8 oz (93.8 kg)      Objective:   Physical Exam Vitals and nursing note reviewed.  Constitutional:      General: He is not in acute distress. Neck:     Vascular: No JVD.  Cardiovascular:     Rate and Rhythm: Normal rate and regular rhythm.     Heart sounds: Normal heart sounds. No murmur heard. Pulmonary:     Effort: Pulmonary effort is normal.     Breath sounds: Normal breath sounds. No wheezing or rales.  Musculoskeletal:     Right lower leg: No edema.     Left lower leg: No edema.          Visit diagnoses:   ICD-10-CM   1. Chronic HFrEF (heart failure with reduced ejection fraction) (HCC)  I50.22 Basic metabolic panel    Pro b natriuretic peptide (BNP)[facility])    2. Dilated cardiomyopathy (HCC)  I42.0 Basic metabolic panel    Pro b natriuretic peptide (BNP)[facility])        Orders Placed This Encounter  Procedures   Basic metabolic panel   Pro b natriuretic peptide (BNP)[facility])       Meds ordered this encounter  Medications   sacubitril-valsartan (ENTRESTO) 49-51 MG    Sig: Take 1 tablet by mouth 2 (two) times daily.    Dispense:  60 tablet    Refill:  2     Assessment & Recommendations:   77 y.o. 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 Class II symptoms. Currently on Entresto 24-26 mg bid, metoprolol succinate 25 mg daily, spironolactone 25 mg daily- tolerating well. Increase Entresto to 49-50 mg twice daily.  Check BMP, proBNP in 1 week.  Follow-up in 4 weeks, where I would attempt to uptitrate metoprolol succinate to 50 mg daily. Will plan on checking echocardiogram in October 2024. (Will order at next visit.)1 Given his h/o UTI, will avoid SGLT2i.   Non-obstructive CAD: Soft non calcified soft  plaque <25% in Lcx and RCA. Lipids favorable, not on statin.   F/u in 4 weeks    Elder Negus, MD Pager: [phone] Office: [phone]

## 2022-12-18 ENCOUNTER — Ambulatory Visit
Admission: RE | Admit: 2022-12-18 | Discharge: 2022-12-18 | Disposition: A | Discharge status: Home / Self Care | Payer: Medicare Other | Source: Ambulatory Visit | Attending: Urology | Admitting: Urology

## 2022-12-18 DIAGNOSIS — N400 Enlarged prostate without lower urinary tract symptoms: Secondary | ICD-10-CM | POA: Diagnosis not present

## 2022-12-18 DIAGNOSIS — R972 Elevated prostate specific antigen [PSA]: Secondary | ICD-10-CM | POA: Diagnosis not present

## 2022-12-18 MED ORDER — GADOPICLENOL 0.5 MMOL/ML IV SOLN
9.0000 mL | Freq: Once | INTRAVENOUS | Status: AC | PRN
  Administered 2022-12-18: 9 mL via INTRAVENOUS

## 2022-12-21 DIAGNOSIS — R7989 Other specified abnormal findings of blood chemistry: Secondary | ICD-10-CM | POA: Diagnosis not present

## 2022-12-26 DIAGNOSIS — R972 Elevated prostate specific antigen [PSA]: Secondary | ICD-10-CM | POA: Diagnosis not present

## 2022-12-26 DIAGNOSIS — R338 Other retention of urine: Secondary | ICD-10-CM | POA: Diagnosis not present

## 2022-12-26 DIAGNOSIS — N401 Enlarged prostate with lower urinary tract symptoms: Secondary | ICD-10-CM | POA: Diagnosis not present

## 2022-12-26 DIAGNOSIS — R350 Frequency of micturition: Secondary | ICD-10-CM | POA: Diagnosis not present

## 2022-12-28 DIAGNOSIS — E039 Hypothyroidism, unspecified: Secondary | ICD-10-CM | POA: Diagnosis not present

## 2022-12-28 DIAGNOSIS — R0600 Dyspnea, unspecified: Secondary | ICD-10-CM | POA: Diagnosis not present

## 2023-01-04 DIAGNOSIS — I5022 Chronic systolic (congestive) heart failure: Secondary | ICD-10-CM | POA: Diagnosis not present

## 2023-01-04 DIAGNOSIS — I420 Dilated cardiomyopathy: Secondary | ICD-10-CM | POA: Diagnosis not present

## 2023-01-10 DIAGNOSIS — R7989 Other specified abnormal findings of blood chemistry: Secondary | ICD-10-CM | POA: Diagnosis not present

## 2023-01-13 ENCOUNTER — Encounter: Payer: Self-pay | Admitting: Cardiology

## 2023-01-13 ENCOUNTER — Ambulatory Visit: Payer: Medicare Other | Admitting: Cardiology

## 2023-01-13 VITALS — BP 140/75 | HR 75 | Resp 16 | Ht 73.0 in | Wt 205.0 lb

## 2023-01-13 DIAGNOSIS — I5022 Chronic systolic (congestive) heart failure: Secondary | ICD-10-CM | POA: Diagnosis not present

## 2023-01-13 MED ORDER — ENTRESTO 97-103 MG PO TABS: 1.0000 | ORAL_TABLET | Freq: Two times a day (BID) | ORAL | 3 refills | Status: DC

## 2023-01-13 NOTE — Progress Notes (Signed)
Patient referred by Alysia Penna, MD for HFrEF  Subjective:   Steven Carter, male    DOB: 05/28/1945, 77 y.o.   MRN: [id number]   No chief complaint on file.   HPI  77 y.o. 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  Initial consultation visit 11/04/2022: Patient was previously seen by heart care cardiologist, now here to establish care with me.  Patient has had complex history of BPH, gross hematuria, UTI and MSSA bacteremia, around the time of treatment for BPH, and subsequent finding dilated LV and EF 35%.Subsequently, coronary CT angiogram ruled out CAD as cause of his cardiomyopathy, showing mild nonobstructive coronary artery disease.  Patient was initially started on Entresto, carvedilol, spirononolactone during hospitalization in 07/2022, all were stopped due low blood pressure or fatigue. He was subsequently started on losartan and switched to Entresto 24-26 mg bid samples 2 weeks ago following finding of persistently low LVEF.  Patient continues to have exertional dyspnea with climbing up flights, denies orthopnea, PND, leg edema. He has had chronic chest pain for 40 years. He still has low energy levels. He has also had anemia that was thought to be due to gross hematuria, and and thrombocytopenia thought to be due to bone marrow suppression due to Zynox used to treat his UTI/bacteremia. He has not had any follow up CBC.     Current Outpatient Medications:    finasteride (PROSCAR) 5 MG tablet, Take 5 mg by mouth daily., Disp: , Rfl:    levothyroxine (SYNTHROID) 150 MCG tablet, Take 150 mcg by mouth daily before breakfast., Disp: , Rfl:    metoprolol succinate (TOPROL-XL) 25 MG 24 hr tablet, Take 1 tablet (25 mg total)  by mouth daily. Take with or immediately following a meal., Disp: 30 tablet, Rfl: 2   sacubitril-valsartan (ENTRESTO) 49-51 MG, Take 1 tablet by mouth 2 (two) times daily., Disp: 60 tablet, Rfl: 2   spironolactone (ALDACTONE) 25 MG tablet, Take 1 tablet (25 mg total) by mouth daily., Disp: 30 tablet, Rfl: 2   tamsulosin (FLOMAX) 0.4 MG CAPS capsule, Take 0.8 mg by mouth daily., Disp: , Rfl:    Cardiovascular and other pertinent studies:  Reviewed external labs and tests, independently interpreted  EKG 11/04/2022: Sinus rhythm 84 bpm  Normal EKG  Echocardiogram 10/20/2022: 1. Left ventricular ejection fraction, by estimation, is 35 to 40%. The  left ventricle has moderately decreased function. The left ventricle  demonstrates global hypokinesis. The left ventricular internal cavity size was mildly to moderately dilated.  Left ventricular diastolic parameters are indeterminate.   2. Right ventricular systolic function is normal. The right ventricular  size is mildly enlarged. There is normal pulmonary artery systolic  pressure.   3. The mitral valve is normal in structure. Trivial mitral valve  regurgitation. No evidence of mitral stenosis.   4. The aortic valve is tricuspid. There is mild calcification of the  aortic valve. There is mild thickening of the aortic valve. Aortic valve  regurgitation is mild. Aortic valve sclerosis is present, with no evidence  of aortic valve stenosis.   5. Aortic dilatation noted. There is mild dilatation of the ascending  aorta, measuring 41 mm.   6. The inferior vena cava is normal in size with greater than 50%  respiratory variability, suggesting right atrial pressure of 3 mmHg.   Comparison(s): Prior images reviewed side by side.   Coronary CTA 08/04/2022: 1. Calcium score is 0 2.  Dilated ascending thoracic aorta 4.0 cm 3.  CAD RADS 1 non obstructive soft plaque (RCA, LCX)   Recent labs: 01/04/2023: Glucose 102, BUN/Cr 17/0.98. EGFR 80. Na/K  142/5.1. Rest of the CMP normal NT proBNP 91  11/25/2022: H/H 11.5/33.9. MCV 88. Platelets 133  11/11/2022: Glucose 96, BUN/Cr 11/0.98. EGFR 80. Na/K 138/4.4. Rest of the CMP normal  07/19/2022: Glucose 96, BUN/Cr 13/0.96. EGFR >60. Na/K 138/4.8. AlKP 36. Albumin 3.3. Rest of the CMP normal BNP 49 H/H 9/29. MCV 93. Platelets 198 HbA1C 5.7% Chol 85, TG 59, HDL 18, LDL 55 TSH NA    Review of Systems  Constitutional: Negative for malaise/fatigue.  Cardiovascular:  Negative for chest pain, dyspnea on exertion, leg swelling, palpitations and syncope.         Vitals:   01/13/23 1005  BP: (!) 140/75  Pulse: 75  Resp: 16  SpO2: 99%       Body mass index is 27.05 kg/m. Filed Weights   01/13/23 1005  Weight: 205 lb (93 kg)       Objective:   Physical Exam Vitals and nursing note reviewed.  Constitutional:      General: He is not in acute distress. Neck:     Vascular: No JVD.  Cardiovascular:     Rate and Rhythm: Normal rate and regular rhythm.     Heart sounds: Normal heart sounds. No murmur heard. Pulmonary:     Effort: Pulmonary effort is normal.     Breath sounds: Normal breath sounds. No wheezing or rales.  Musculoskeletal:     Right lower leg: No edema.     Left lower leg: No edema.          Visit diagnoses:   ICD-10-CM   1. Chronic HFrEF (heart failure with reduced ejection fraction) (HCC)  I50.22 Basic metabolic panel    PCV ECHOCARDIOGRAM COMPLETE         Orders Placed This Encounter  Procedures   Basic metabolic panel   PCV ECHOCARDIOGRAM COMPLETE      Meds ordered this encounter  Medications   sacubitril-valsartan (ENTRESTO) 97-103 MG    Sig: Take 1 tablet by mouth 2 (two) times daily.    Dispense:  180 tablet    Refill:  3     Assessment & Recommendations:   77 y.o. 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 Class II symptoms. Currently on Entresto 49-51 mg bid, metoprolol succinate 25 mg daily, spironolactone 25 mg daily- tolerating well. Will further uptitrate Entresto to 97-103 mg twice daily.  Check BMP in 1 week.   Check echocardiogram in 04/2023.   Given his h/o UTI, will avoid SGLT2i.   Non-obstructive CAD: Soft non calcified soft plaque <25% in Lcx and RCA. Lipids favorable, not on statin.   F/u in 05/2023.  After that, anticipate once year follow-up.      Elder Negus, MD Pager: [phone] Office: [phone]

## 2023-01-16 ENCOUNTER — Telehealth: Payer: Self-pay

## 2023-01-16 NOTE — Telephone Encounter (Signed)
Pt's spouse calling to check status of pt assistance. Has not heard anything from [place]

## 2023-01-17 NOTE — Telephone Encounter (Signed)
Patients wife came into the office we will resend the provider portion again to pt assistance

## 2023-01-25 ENCOUNTER — Telehealth: Payer: Self-pay | Admitting: Cardiology

## 2023-01-25 NOTE — Telephone Encounter (Signed)
PT wife came in office to let us know that [employer] needs  updated RX with highest dose of the entresto medication. She also stated that [employer] needs a new contact number for [facility].

## 2023-01-25 NOTE — Telephone Encounter (Signed)
Steven Foot, LPN pt needs [name] Rx sent to [employer] pt assistance program. Please address

## 2023-01-26 MED ORDER — ENTRESTO 97-103 MG PO TABS: 1.0000 | ORAL_TABLET | Freq: Two times a day (BID) | ORAL | 3 refills | Status: DC

## 2023-01-26 NOTE — Telephone Encounter (Signed)
**Note De-Identified Riyansh Gerstner Obfuscation** As requested, I have e-scribed the pts Entresto 97-103 mg RX to [facility] [place], [place] - [address] (Ph: [phone]) to fill for #180 with 3 refills.

## 2023-01-29 ENCOUNTER — Other Ambulatory Visit: Payer: Self-pay | Admitting: Cardiology

## 2023-01-31 DIAGNOSIS — R972 Elevated prostate specific antigen [PSA]: Secondary | ICD-10-CM | POA: Diagnosis not present

## 2023-01-31 DIAGNOSIS — N400 Enlarged prostate without lower urinary tract symptoms: Secondary | ICD-10-CM | POA: Diagnosis not present

## 2023-01-31 DIAGNOSIS — R339 Retention of urine, unspecified: Secondary | ICD-10-CM | POA: Diagnosis not present

## 2023-02-02 ENCOUNTER — Telehealth: Payer: Self-pay | Admitting: Cardiology

## 2023-02-02 NOTE — Telephone Encounter (Signed)
Pt c/o medication issue:  1. Name of Medication:   sacubitril-valsartan (ENTRESTO) 97-103 MG    2. How are you currently taking this medication (dosage and times per day)? Take 1 tablet by mouth 2 (two) times daily.   3. Are you having a reaction (difficulty breathing--STAT)? No  4. What is your medication issue? Patient's wife is calling to follow up on the Patient assistance paperwork she had dropped off. Please advise.

## 2023-02-03 ENCOUNTER — Other Ambulatory Visit (HOSPITAL_COMMUNITY): Payer: Self-pay

## 2023-02-03 ENCOUNTER — Telehealth: Payer: Self-pay

## 2023-02-03 MED ORDER — ENTRESTO 97-103 MG PO TABS: 1.0000 | ORAL_TABLET | Freq: Two times a day (BID) | ORAL | 3 refills | Status: AC

## 2023-02-03 NOTE — Telephone Encounter (Signed)
Thank you Christine.

## 2023-02-03 NOTE — Telephone Encounter (Signed)
Rx sent to [facility] [address]

## 2023-02-03 NOTE — Telephone Encounter (Signed)
Message received regarding PAP for patient's Entresto. Per insurance search in [place], patient has Nurse, learning disability. Cost for a 90 DS was over $900.   I was able to sign him up for a copay card bringing the cost down to $10. I cannot provide pharmacy with copay card till RX has been sent.

## 2023-02-03 NOTE — Telephone Encounter (Signed)
Please send RX for 90 day supply of Entresto 97-103 mg to [id number] [address]. Will provide pharmacy with copay card once they have RX.

## 2023-02-03 NOTE — Telephone Encounter (Signed)
Per insurance search in [place], patient has Nurse, learning disability. Cost for a 90 DS was over $900. I was able to sign him up for a copay card bringing the cost down to $10. I cannot provide pharmacy with copay card till RX has been sent. Message sent to Triage requesting script be sent in to retail.

## 2023-02-06 ENCOUNTER — Other Ambulatory Visit (HOSPITAL_COMMUNITY): Payer: Self-pay

## 2023-02-06 ENCOUNTER — Other Ambulatory Visit: Payer: Self-pay

## 2023-02-06 NOTE — Telephone Encounter (Signed)
This has been sent in by triage already, thank you!

## 2023-02-06 NOTE — Telephone Encounter (Signed)
Called [facility] and gave the copay card this morning, RPH called back to let me know RX is filled for $10.

## 2023-02-07 DIAGNOSIS — I5022 Chronic systolic (congestive) heart failure: Secondary | ICD-10-CM | POA: Diagnosis not present

## 2023-02-08 DIAGNOSIS — D696 Thrombocytopenia, unspecified: Secondary | ICD-10-CM | POA: Diagnosis not present

## 2023-02-08 LAB — BASIC METABOLIC PANEL
BUN/Creatinine Ratio: 13 (ref 10–24)
BUN: 16 mg/dL (ref 8–27)
CO2: 22 mmol/L (ref 20–29)
Calcium: 8.9 mg/dL (ref 8.6–10.2)
Chloride: 105 mmol/L (ref 96–106)
Creatinine, Ser: 1.23 mg/dL (ref 0.76–1.27)
Glucose: 100 mg/dL — ABNORMAL HIGH (ref 70–99)
Potassium: 5 mmol/L (ref 3.5–5.2)
Sodium: 140 mmol/L (ref 134–144)
eGFR: 61 mL/min/{1.73_m2} (ref >59)

## 2023-02-21 DIAGNOSIS — H348122 Central retinal vein occlusion, left eye, stable: Secondary | ICD-10-CM | POA: Diagnosis not present

## 2023-02-27 DIAGNOSIS — C9310 Chronic myelomonocytic leukemia not having achieved remission: Secondary | ICD-10-CM | POA: Diagnosis not present

## 2023-03-06 ENCOUNTER — Telehealth: Payer: Self-pay | Admitting: *Deleted

## 2023-03-06 DIAGNOSIS — H2512 Age-related nuclear cataract, left eye: Secondary | ICD-10-CM | POA: Diagnosis not present

## 2023-03-06 NOTE — Telephone Encounter (Signed)
   Pre-operative Risk Assessment    Patient Name: TIBERIUS LOFTUS  DOB: 1946/03/30 MRN: [id number]    DATE OF LAST VISIT: 01/13/23 DR. PATWARDHAN DATE OF NEXT VISIT: NONE  Request for Surgical Clearance    Procedure:   CATARACT EXTRACTION BY PE, IOL-LEFT EYE THEN FOLLOWED BY THE RIGHT EYE  Date of Surgery:  Clearance 03/08/23  LEFT EYE AND RIGHT EYE TO BE DONE 03/22/23                               Surgeon:  DR. Francee Piccolo DeLMONTE Surgeon's Group or Practice Name:  [facility]  Phone number:  [phone] Fax number:  [phone]   Type of Clearance Requested:   - Medical ; NONE INDICATED TO BE HELD; REQUESTING CLEARANCE DUE TO RECENT DIAGNOSTIC FINDING OF LOW EF%. PT IS ASYMPTOMATIC AT TIME OF VISIT TODAY   Type of Anesthesia:   IV SEDATION   Additional requests/questions:    Elpidio Anis   03/06/2023, 1:14 PM

## 2023-03-06 NOTE — Telephone Encounter (Signed)
   Patient Name: Steven Carter  DOB: 08/13/1945 MRN: [id number]  Primary Cardiologist: Thomasene Ripple, DO  Chart reviewed as part of pre-operative protocol coverage. Cataract extractions are recognized in guidelines as low risk surgeries that do not typically require specific preoperative testing or holding of blood thinner therapy. Therefore, given past medical history and time since last visit, based on ACC/AHA guidelines, Steven Carter would be at acceptable risk for the planned procedure without further cardiovascular testing.   I will route this recommendation to the requesting party via Epic fax function and remove from pre-op pool.  Please call with questions.  Carlos Levering, NP 03/06/2023, 1:21 PM

## 2023-03-08 DIAGNOSIS — E039 Hypothyroidism, unspecified: Secondary | ICD-10-CM | POA: Diagnosis not present

## 2023-03-08 DIAGNOSIS — H2512 Age-related nuclear cataract, left eye: Secondary | ICD-10-CM | POA: Diagnosis not present

## 2023-03-14 ENCOUNTER — Telehealth: Payer: Self-pay | Admitting: *Deleted

## 2023-03-14 DIAGNOSIS — H938X3 Other specified disorders of ear, bilateral: Secondary | ICD-10-CM | POA: Diagnosis not present

## 2023-03-14 DIAGNOSIS — I5022 Chronic systolic (congestive) heart failure: Secondary | ICD-10-CM

## 2023-03-14 DIAGNOSIS — I420 Dilated cardiomyopathy: Secondary | ICD-10-CM

## 2023-03-14 DIAGNOSIS — I351 Nonrheumatic aortic (valve) insufficiency: Secondary | ICD-10-CM

## 2023-03-14 DIAGNOSIS — H903 Sensorineural hearing loss, bilateral: Secondary | ICD-10-CM | POA: Diagnosis not present

## 2023-03-14 NOTE — Telephone Encounter (Signed)
-----   Message from [facility] sent at 03/14/2023  9:15 AM EST ----- Yes. He does need an echo in 04/2023.  Thanks MJP ----- Message ----- From: Minette Brine Sent: 03/13/2023   1:37 PM EST To: Elder Negus, MD  Does patient need echo still.. if so will you please create the new order. I really appreciate it.

## 2023-03-14 NOTE — Telephone Encounter (Signed)
New echo order placed in the system.  Will send this note back to our Echo  Scheduler to call the pt back to arrange echo appt.

## 2023-03-15 DIAGNOSIS — I5022 Chronic systolic (congestive) heart failure: Secondary | ICD-10-CM | POA: Diagnosis not present

## 2023-03-16 DIAGNOSIS — Z136 Encounter for screening for cardiovascular disorders: Secondary | ICD-10-CM | POA: Diagnosis not present

## 2023-03-20 ENCOUNTER — Telehealth (HOSPITAL_COMMUNITY): Payer: Self-pay | Admitting: Cardiology

## 2023-03-20 NOTE — Telephone Encounter (Signed)
I called and spoke with patient on 03/16/23 to schedule the ordered echocardiogram. Patient states he will call us back to schedule. Order will be removed from the active echo WQ. Thank you.

## 2023-03-22 DIAGNOSIS — I10 Essential (primary) hypertension: Secondary | ICD-10-CM | POA: Diagnosis not present

## 2023-03-22 DIAGNOSIS — H2511 Age-related nuclear cataract, right eye: Secondary | ICD-10-CM | POA: Diagnosis not present

## 2023-04-03 DIAGNOSIS — C9310 Chronic myelomonocytic leukemia not having achieved remission: Secondary | ICD-10-CM | POA: Diagnosis not present

## 2023-04-28 IMAGING — MR MR HEAD WO/W CM
13 series · 48 of 48 positions shown · IV contrast (gadavist)
Comparison: None.

CLINICAL DATA: Short-term memory loss, frontal headaches

EXAM:
MRI HEAD WITHOUT AND WITH CONTRAST
TECHNIQUE: Multiplanar, multiecho pulse sequences of the brain and surrounding
structures were obtained without and with intravenous contrast.
CONTRAST:  10mL GADAVIST GADOBUTROL 1 MMOL/ML IV SOLN

[Series 5: DWI · axial · 3.0mm · 1.36mm/px · z∈[-77,+100]mm · 6 of 120 slices shown (1 of 2)]
[im 1/120]
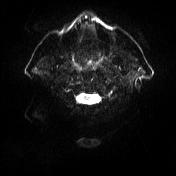
[im 24/120]
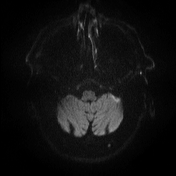
[im 48/120]
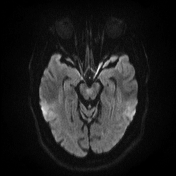
[im 72/120]
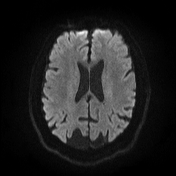
[im 96/120]
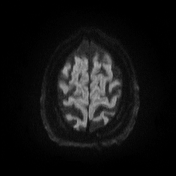
[im 120/120]
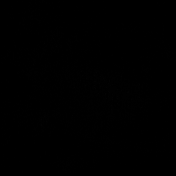

[Series 6: DWI · axial · 3.0mm · 1.36mm/px · z∈[-77,+97]mm · 3 of 59 slices shown (2 of 2)]
[im 1/59]
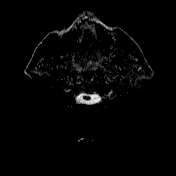
[im 30/59]
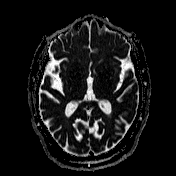
[im 59/59]
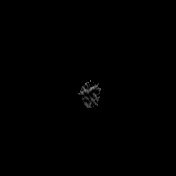

[Series 7: T1 · sagittal · 5.0mm · 0.75mm/px · 1 of 26 slices shown (1 of 2)]
[im 1/26]
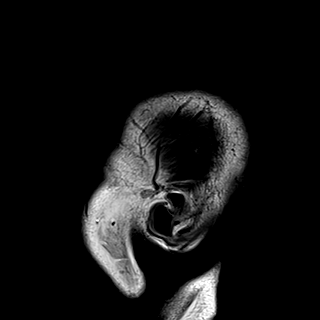

[Series 8: T2 · axial · 5.0mm · 0.62mm/px · z∈[-72,+102]mm · 2 of 28 slices shown]
[im 1/28]
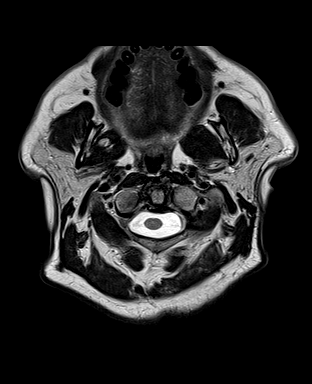
[im 28/28]
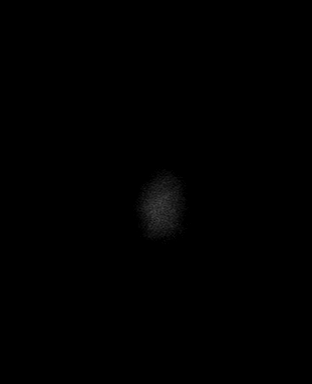

[Series 9: swi_images · axial · 3.0mm · 0.75mm/px · z∈[-73,+103]mm · 3 of 60 slices shown]
[im 1/60]
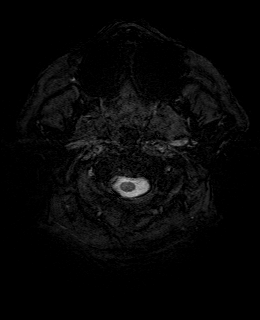
[im 30/60]
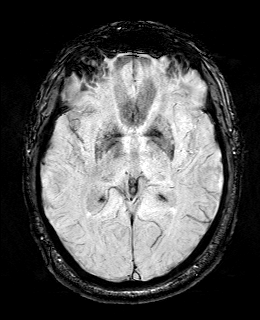
[im 60/60]
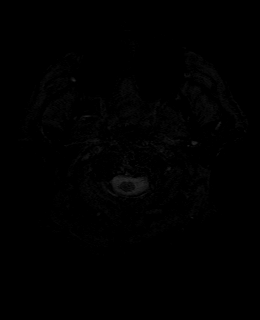

[Series 11: FLAIR · axial · 3.0mm · 0.75mm/px · z∈[-70,+100]mm · 3 of 58 slices shown]
[im 1/58]
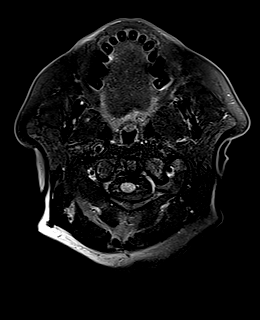
[im 29/58]
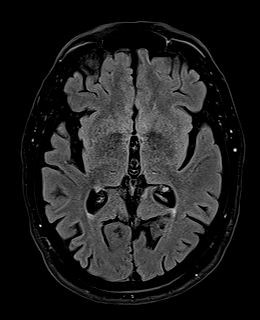
[im 58/58]
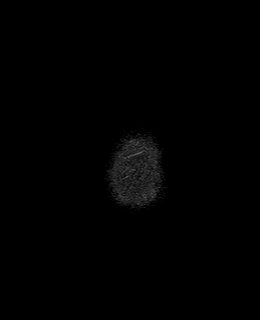

[Series 12: T1 · axial · 1.0mm · 0.94mm/px · z∈[-64,+110]mm · 10 of 176 slices shown (2 of 2)]
[im 1/176]
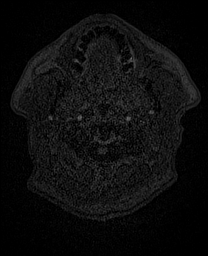
[im 20/176]
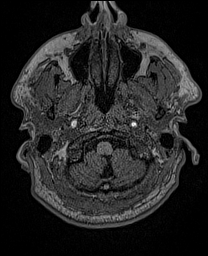
[im 39/176]
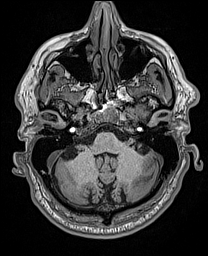
[im 59/176]
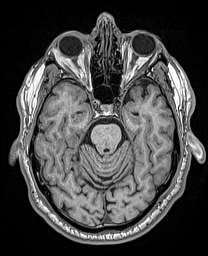
[im 78/176]
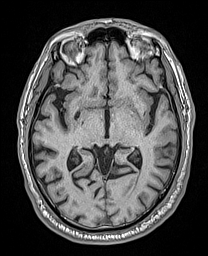
[im 98/176]
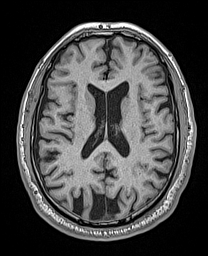
[im 117/176]
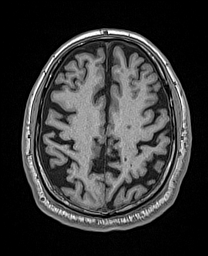
[im 137/176]
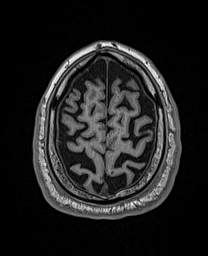
[im 156/176]
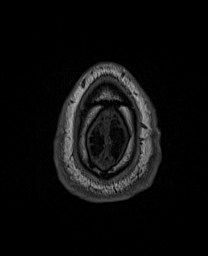
[im 176/176]
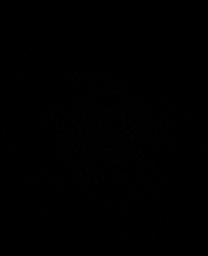

[Series 13: cor dwi_tracew · coronal · 5.0mm · 1.53mm/px · 3 of 60 slices shown]
[im 1/60]
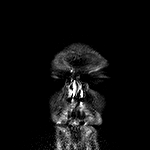
[im 30/60]
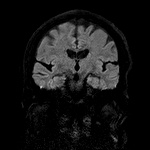
[im 60/60]
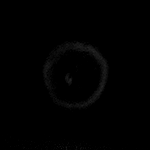

[Series 14: cor dwi_adc · coronal · 5.0mm · 1.53mm/px · 2 of 30 slices shown]
[im 1/30]
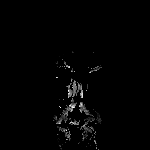
[im 30/30]
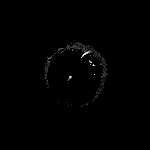

[Series 15: T2 post-contrast · coronal · 5.0mm · 0.57mm/px · 2 of 30 slices shown]
[im 1/30]
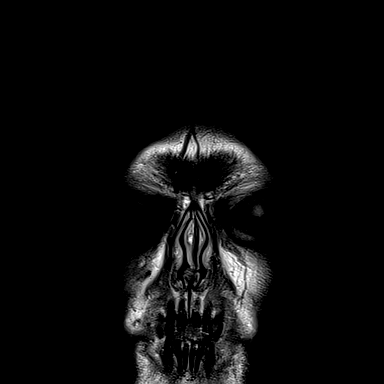
[im 30/30]
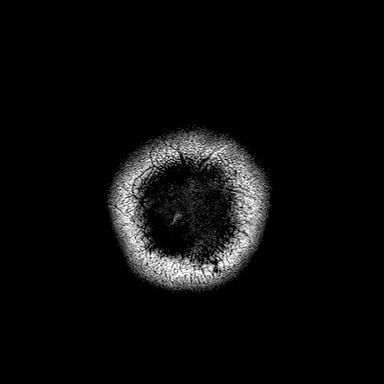

[Series 16: T1 post-contrast · axial · 1.0mm · 0.94mm/px · z∈[-64,+110]mm · 10 of 176 slices shown (1 of 3)]
[im 1/176]
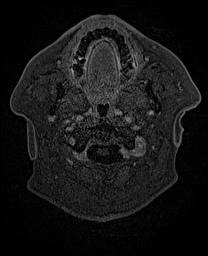
[im 20/176]
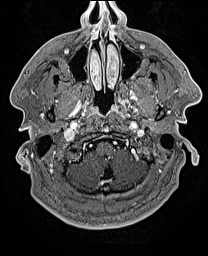
[im 39/176]
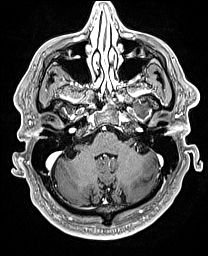
[im 59/176]
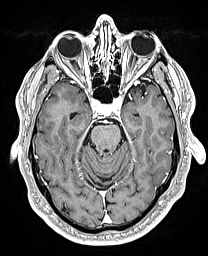
[im 78/176]
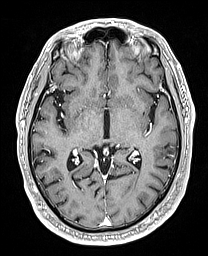
[im 98/176]
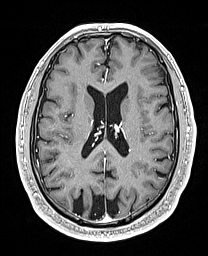
[im 117/176]
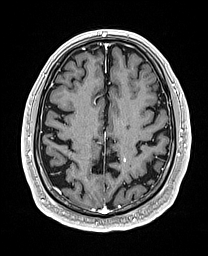
[im 137/176]
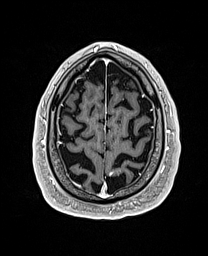
[im 156/176]
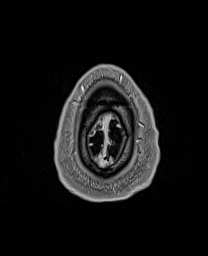
[im 176/176]
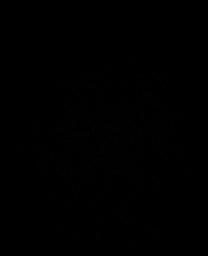

[Series 17: T1 post-contrast · coronal · 5.0mm · 0.43mm/px · 2 of 30 slices shown (2 of 3)]
[im 1/30]
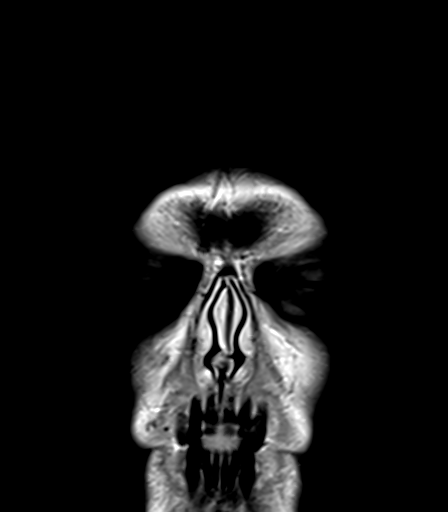
[im 30/30]
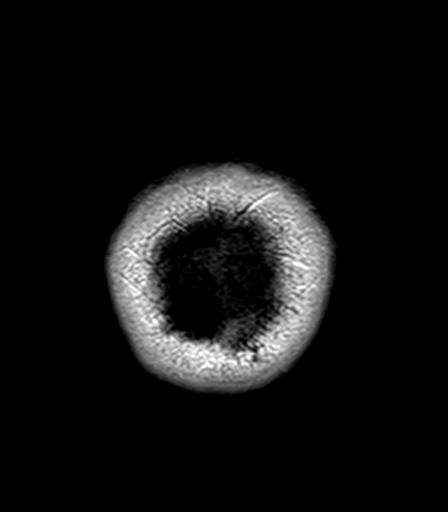

[Series 18: T1 post-contrast · sagittal · 5.0mm · 0.75mm/px · 1 of 26 slices shown (3 of 3)]
[im 1/26]
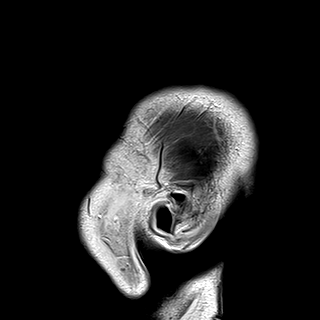

[48 of 48 positions shown; findings below may reference images not displayed]

FINDINGS: Brain: No restricted diffusion to suggest acute infarct. No acute
hemorrhage or hemosiderin deposition to suggest remote hemorrhage or
superficial siderosis. Disproportionate atrophy in the bilateral
parietal lobes, with widening of the sulci (series 16, image 130 and
series 7, image 14). No abnormal enhancement. No mass, mass effect,
or midline shift. No hydrocephalus or extra-axial collection.

Vascular: Normal flow voids.

Skull and upper cervical spine: Normal marrow signal.

Sinuses/Orbits: Negative.

Other: The mastoids are well aerated.
IMPRESSION: Disproportionate atrophy in the bilateral temporal lobes.

## 2023-05-02 DIAGNOSIS — I5022 Chronic systolic (congestive) heart failure: Secondary | ICD-10-CM | POA: Diagnosis not present

## 2023-05-03 LAB — CBC
Hematocrit: 33.5 % — ABNORMAL LOW (ref 37.5–51.0)
Hemoglobin: 11.2 g/dL — ABNORMAL LOW (ref 13.0–17.7)
MCH: 31.8 pg (ref 26.6–33.0)
MCHC: 33.4 g/dL (ref 31.5–35.7)
MCV: 95 fL (ref 79–97)
Platelets: 154 10*3/uL (ref 150–450)
RBC: 3.52 x10E6/uL — ABNORMAL LOW (ref 4.14–5.80)
RDW: 13.9 % (ref 11.6–15.4)
WBC: 3.7 10*3/uL (ref 3.4–10.8)

## 2023-05-03 LAB — PRO B NATRIURETIC PEPTIDE: NT-Pro BNP: 68 pg/mL (ref 0–486)

## 2023-05-30 DIAGNOSIS — D61818 Other pancytopenia: Secondary | ICD-10-CM | POA: Diagnosis not present

## 2023-05-30 DIAGNOSIS — C9310 Chronic myelomonocytic leukemia not having achieved remission: Secondary | ICD-10-CM | POA: Diagnosis not present

## 2023-06-02 DIAGNOSIS — I429 Cardiomyopathy, unspecified: Secondary | ICD-10-CM | POA: Diagnosis not present

## 2023-06-02 DIAGNOSIS — R9439 Abnormal result of other cardiovascular function study: Secondary | ICD-10-CM | POA: Diagnosis not present

## 2023-06-02 DIAGNOSIS — I351 Nonrheumatic aortic (valve) insufficiency: Secondary | ICD-10-CM | POA: Diagnosis not present

## 2023-06-13 ENCOUNTER — Other Ambulatory Visit (HOSPITAL_COMMUNITY): Payer: Self-pay

## 2023-06-19 DIAGNOSIS — R7989 Other specified abnormal findings of blood chemistry: Secondary | ICD-10-CM | POA: Diagnosis not present

## 2023-06-19 DIAGNOSIS — Z1389 Encounter for screening for other disorder: Secondary | ICD-10-CM | POA: Diagnosis not present

## 2023-06-19 DIAGNOSIS — E039 Hypothyroidism, unspecified: Secondary | ICD-10-CM | POA: Diagnosis not present

## 2023-06-19 DIAGNOSIS — E559 Vitamin D deficiency, unspecified: Secondary | ICD-10-CM | POA: Diagnosis not present

## 2023-06-19 DIAGNOSIS — E162 Hypoglycemia, unspecified: Secondary | ICD-10-CM | POA: Diagnosis not present

## 2023-06-19 DIAGNOSIS — N400 Enlarged prostate without lower urinary tract symptoms: Secondary | ICD-10-CM | POA: Diagnosis not present

## 2023-06-26 DIAGNOSIS — Z1339 Encounter for screening examination for other mental health and behavioral disorders: Secondary | ICD-10-CM | POA: Diagnosis not present

## 2023-06-26 DIAGNOSIS — Z0000 Encounter for general adult medical examination without abnormal findings: Secondary | ICD-10-CM | POA: Diagnosis not present

## 2023-06-26 DIAGNOSIS — Z1331 Encounter for screening for depression: Secondary | ICD-10-CM | POA: Diagnosis not present

## 2023-06-26 DIAGNOSIS — C9310 Chronic myelomonocytic leukemia not having achieved remission: Secondary | ICD-10-CM | POA: Diagnosis not present

## 2023-07-19 DIAGNOSIS — R0600 Dyspnea, unspecified: Secondary | ICD-10-CM | POA: Diagnosis not present

## 2023-08-07 DIAGNOSIS — R972 Elevated prostate specific antigen [PSA]: Secondary | ICD-10-CM | POA: Diagnosis not present

## 2023-08-07 DIAGNOSIS — N400 Enlarged prostate without lower urinary tract symptoms: Secondary | ICD-10-CM | POA: Diagnosis not present

## 2023-08-07 DIAGNOSIS — R339 Retention of urine, unspecified: Secondary | ICD-10-CM | POA: Diagnosis not present

## 2023-08-23 DIAGNOSIS — H353131 Nonexudative age-related macular degeneration, bilateral, early dry stage: Secondary | ICD-10-CM | POA: Diagnosis not present

## 2023-08-24 DIAGNOSIS — N400 Enlarged prostate without lower urinary tract symptoms: Secondary | ICD-10-CM | POA: Diagnosis not present

## 2023-08-28 DIAGNOSIS — M5459 Other low back pain: Secondary | ICD-10-CM | POA: Diagnosis not present

## 2023-08-28 DIAGNOSIS — M51362 Other intervertebral disc degeneration, lumbar region with discogenic back pain and lower extremity pain: Secondary | ICD-10-CM | POA: Diagnosis not present

## 2023-08-30 DIAGNOSIS — C9310 Chronic myelomonocytic leukemia not having achieved remission: Secondary | ICD-10-CM | POA: Diagnosis not present

## 2023-08-30 DIAGNOSIS — C9311 Chronic myelomonocytic leukemia, in remission: Secondary | ICD-10-CM | POA: Diagnosis not present

## 2023-09-24 DIAGNOSIS — L237 Allergic contact dermatitis due to plants, except food: Secondary | ICD-10-CM | POA: Diagnosis not present

## 2023-09-26 DIAGNOSIS — M5416 Radiculopathy, lumbar region: Secondary | ICD-10-CM | POA: Diagnosis not present

## 2023-10-12 DIAGNOSIS — R350 Frequency of micturition: Secondary | ICD-10-CM | POA: Diagnosis not present

## 2023-10-12 DIAGNOSIS — R972 Elevated prostate specific antigen [PSA]: Secondary | ICD-10-CM | POA: Diagnosis not present

## 2023-10-12 DIAGNOSIS — N401 Enlarged prostate with lower urinary tract symptoms: Secondary | ICD-10-CM | POA: Diagnosis not present

## 2023-10-12 DIAGNOSIS — R338 Other retention of urine: Secondary | ICD-10-CM | POA: Diagnosis not present

## 2023-10-18 DIAGNOSIS — M51362 Other intervertebral disc degeneration, lumbar region with discogenic back pain and lower extremity pain: Secondary | ICD-10-CM | POA: Diagnosis not present

## 2023-10-19 DIAGNOSIS — I351 Nonrheumatic aortic (valve) insufficiency: Secondary | ICD-10-CM | POA: Diagnosis not present

## 2023-10-19 DIAGNOSIS — I371 Nonrheumatic pulmonary valve insufficiency: Secondary | ICD-10-CM | POA: Diagnosis not present

## 2023-10-19 DIAGNOSIS — I361 Nonrheumatic tricuspid (valve) insufficiency: Secondary | ICD-10-CM | POA: Diagnosis not present

## 2023-10-19 DIAGNOSIS — R0600 Dyspnea, unspecified: Secondary | ICD-10-CM | POA: Diagnosis not present

## 2023-10-19 DIAGNOSIS — I088 Other rheumatic multiple valve diseases: Secondary | ICD-10-CM | POA: Diagnosis not present

## 2023-11-09 DIAGNOSIS — E162 Hypoglycemia, unspecified: Secondary | ICD-10-CM | POA: Diagnosis not present

## 2023-11-15 DIAGNOSIS — I5032 Chronic diastolic (congestive) heart failure: Secondary | ICD-10-CM | POA: Diagnosis not present

## 2023-12-13 DIAGNOSIS — D649 Anemia, unspecified: Secondary | ICD-10-CM | POA: Diagnosis not present

## 2023-12-13 DIAGNOSIS — C9310 Chronic myelomonocytic leukemia not having achieved remission: Secondary | ICD-10-CM | POA: Diagnosis not present

## 2023-12-27 DIAGNOSIS — D485 Neoplasm of uncertain behavior of skin: Secondary | ICD-10-CM | POA: Diagnosis not present

## 2023-12-27 DIAGNOSIS — C44619 Basal cell carcinoma of skin of left upper limb, including shoulder: Secondary | ICD-10-CM | POA: Diagnosis not present

## 2023-12-27 DIAGNOSIS — L570 Actinic keratosis: Secondary | ICD-10-CM | POA: Diagnosis not present

## 2024-01-11 DIAGNOSIS — R351 Nocturia: Secondary | ICD-10-CM | POA: Diagnosis not present

## 2024-01-11 DIAGNOSIS — N401 Enlarged prostate with lower urinary tract symptoms: Secondary | ICD-10-CM | POA: Diagnosis not present

## 2024-01-11 DIAGNOSIS — R972 Elevated prostate specific antigen [PSA]: Secondary | ICD-10-CM | POA: Diagnosis not present

## 2024-01-11 DIAGNOSIS — R350 Frequency of micturition: Secondary | ICD-10-CM | POA: Diagnosis not present

## 2024-01-16 DIAGNOSIS — R338 Other retention of urine: Secondary | ICD-10-CM | POA: Diagnosis not present

## 2024-01-16 DIAGNOSIS — R351 Nocturia: Secondary | ICD-10-CM | POA: Diagnosis not present

## 2024-01-16 DIAGNOSIS — R310 Gross hematuria: Secondary | ICD-10-CM | POA: Diagnosis not present

## 2024-01-16 DIAGNOSIS — N401 Enlarged prostate with lower urinary tract symptoms: Secondary | ICD-10-CM | POA: Diagnosis not present

## 2024-01-16 DIAGNOSIS — R972 Elevated prostate specific antigen [PSA]: Secondary | ICD-10-CM | POA: Diagnosis not present

## 2024-01-16 DIAGNOSIS — N400 Enlarged prostate without lower urinary tract symptoms: Secondary | ICD-10-CM | POA: Diagnosis not present

## 2024-01-16 DIAGNOSIS — I509 Heart failure, unspecified: Secondary | ICD-10-CM | POA: Diagnosis not present

## 2024-01-16 DIAGNOSIS — R350 Frequency of micturition: Secondary | ICD-10-CM | POA: Diagnosis not present

## 2024-01-16 DIAGNOSIS — I771 Stricture of artery: Secondary | ICD-10-CM | POA: Diagnosis not present

## 2024-01-17 DIAGNOSIS — N401 Enlarged prostate with lower urinary tract symptoms: Secondary | ICD-10-CM | POA: Diagnosis not present

## 2024-01-17 DIAGNOSIS — R338 Other retention of urine: Secondary | ICD-10-CM | POA: Diagnosis not present

## 2024-01-17 DIAGNOSIS — R310 Gross hematuria: Secondary | ICD-10-CM | POA: Diagnosis not present

## 2024-01-17 DIAGNOSIS — R350 Frequency of micturition: Secondary | ICD-10-CM | POA: Diagnosis not present

## 2024-01-17 DIAGNOSIS — I771 Stricture of artery: Secondary | ICD-10-CM | POA: Diagnosis not present

## 2024-01-17 DIAGNOSIS — R351 Nocturia: Secondary | ICD-10-CM | POA: Diagnosis not present

## 2024-01-17 DIAGNOSIS — R972 Elevated prostate specific antigen [PSA]: Secondary | ICD-10-CM | POA: Diagnosis not present

## 2024-01-17 DIAGNOSIS — I509 Heart failure, unspecified: Secondary | ICD-10-CM | POA: Diagnosis not present

## 2024-02-19 DIAGNOSIS — N401 Enlarged prostate with lower urinary tract symptoms: Secondary | ICD-10-CM | POA: Diagnosis not present

## 2024-02-19 DIAGNOSIS — R351 Nocturia: Secondary | ICD-10-CM | POA: Diagnosis not present

## 2024-02-19 DIAGNOSIS — R338 Other retention of urine: Secondary | ICD-10-CM | POA: Diagnosis not present

## 2024-02-19 DIAGNOSIS — R350 Frequency of micturition: Secondary | ICD-10-CM | POA: Diagnosis not present

## 2024-03-04 DIAGNOSIS — E039 Hypothyroidism, unspecified: Secondary | ICD-10-CM | POA: Diagnosis not present

## 2024-03-13 DIAGNOSIS — C9311 Chronic myelomonocytic leukemia, in remission: Secondary | ICD-10-CM | POA: Diagnosis not present

## 2024-03-13 DIAGNOSIS — C9310 Chronic myelomonocytic leukemia not having achieved remission: Secondary | ICD-10-CM | POA: Diagnosis not present

## 2024-04-04 DIAGNOSIS — C44619 Basal cell carcinoma of skin of left upper limb, including shoulder: Secondary | ICD-10-CM | POA: Diagnosis not present

## 2024-04-04 DIAGNOSIS — D485 Neoplasm of uncertain behavior of skin: Secondary | ICD-10-CM | POA: Diagnosis not present
